# Patient Record
Sex: Female | Born: 1955
Health system: Southern US, Community
[De-identification: ages and names within clinical notes are randomized; demographics above are authoritative.]

## PROBLEM LIST (undated history)

## (undated) DIAGNOSIS — I639 Cerebral infarction, unspecified: Secondary | ICD-10-CM

## (undated) DIAGNOSIS — I10 Essential (primary) hypertension: Secondary | ICD-10-CM

## (undated) DIAGNOSIS — M1990 Unspecified osteoarthritis, unspecified site: Secondary | ICD-10-CM

## (undated) DIAGNOSIS — I739 Peripheral vascular disease, unspecified: Secondary | ICD-10-CM

## (undated) HISTORY — PX: TUBAL LIGATION: SHX77

## (undated) HISTORY — DX: Cerebral infarction, unspecified: I63.9

## (undated) HISTORY — DX: Peripheral vascular disease, unspecified: I73.9

---

## 1998-07-12 ENCOUNTER — Other Ambulatory Visit: Admission: RE | Admit: 1998-07-12 | Discharge: 1998-07-12 | Payer: Self-pay | Admitting: Obstetrics

## 2002-01-08 ENCOUNTER — Encounter: Payer: Self-pay | Admitting: *Deleted

## 2002-01-08 ENCOUNTER — Emergency Department (HOSPITAL_COMMUNITY): Admission: EM | Admit: 2002-01-08 | Discharge: 2002-01-08 | Payer: Self-pay | Admitting: *Deleted

## 2002-09-11 ENCOUNTER — Emergency Department (HOSPITAL_COMMUNITY): Admission: EM | Admit: 2002-09-11 | Discharge: 2002-09-12 | Payer: Self-pay | Admitting: Emergency Medicine

## 2002-09-11 ENCOUNTER — Encounter: Payer: Self-pay | Admitting: Emergency Medicine

## 2004-08-19 ENCOUNTER — Emergency Department (HOSPITAL_COMMUNITY): Admission: EM | Admit: 2004-08-19 | Discharge: 2004-08-19 | Payer: Self-pay | Admitting: Emergency Medicine

## 2004-09-27 ENCOUNTER — Ambulatory Visit: Payer: Self-pay | Admitting: Hematology and Oncology

## 2005-09-05 ENCOUNTER — Emergency Department (HOSPITAL_COMMUNITY): Admission: EM | Admit: 2005-09-05 | Discharge: 2005-09-05 | Payer: Self-pay | Admitting: Emergency Medicine

## 2015-10-08 DIAGNOSIS — I10 Essential (primary) hypertension: Secondary | ICD-10-CM | POA: Diagnosis not present

## 2015-10-08 DIAGNOSIS — Z0000 Encounter for general adult medical examination without abnormal findings: Secondary | ICD-10-CM | POA: Diagnosis not present

## 2015-10-11 DIAGNOSIS — I10 Essential (primary) hypertension: Secondary | ICD-10-CM | POA: Diagnosis not present

## 2015-10-11 DIAGNOSIS — R69 Illness, unspecified: Secondary | ICD-10-CM | POA: Diagnosis not present

## 2016-04-07 DIAGNOSIS — M1711 Unilateral primary osteoarthritis, right knee: Secondary | ICD-10-CM | POA: Diagnosis not present

## 2016-04-07 DIAGNOSIS — M1712 Unilateral primary osteoarthritis, left knee: Secondary | ICD-10-CM | POA: Diagnosis not present

## 2016-05-01 DIAGNOSIS — Z6832 Body mass index (BMI) 32.0-32.9, adult: Secondary | ICD-10-CM | POA: Diagnosis not present

## 2016-05-01 DIAGNOSIS — Z0000 Encounter for general adult medical examination without abnormal findings: Secondary | ICD-10-CM | POA: Diagnosis not present

## 2016-05-01 DIAGNOSIS — R69 Illness, unspecified: Secondary | ICD-10-CM | POA: Diagnosis not present

## 2016-05-01 DIAGNOSIS — I10 Essential (primary) hypertension: Secondary | ICD-10-CM | POA: Diagnosis not present

## 2016-05-01 DIAGNOSIS — E669 Obesity, unspecified: Secondary | ICD-10-CM | POA: Diagnosis not present

## 2016-05-01 DIAGNOSIS — M170 Bilateral primary osteoarthritis of knee: Secondary | ICD-10-CM | POA: Diagnosis not present

## 2016-05-09 DIAGNOSIS — M1711 Unilateral primary osteoarthritis, right knee: Secondary | ICD-10-CM | POA: Diagnosis not present

## 2016-05-09 DIAGNOSIS — M1712 Unilateral primary osteoarthritis, left knee: Secondary | ICD-10-CM | POA: Diagnosis not present

## 2016-05-23 DIAGNOSIS — M1711 Unilateral primary osteoarthritis, right knee: Secondary | ICD-10-CM | POA: Diagnosis not present

## 2016-05-23 DIAGNOSIS — M1712 Unilateral primary osteoarthritis, left knee: Secondary | ICD-10-CM | POA: Diagnosis not present

## 2016-05-30 DIAGNOSIS — M1711 Unilateral primary osteoarthritis, right knee: Secondary | ICD-10-CM | POA: Diagnosis not present

## 2016-05-30 DIAGNOSIS — M1712 Unilateral primary osteoarthritis, left knee: Secondary | ICD-10-CM | POA: Diagnosis not present

## 2016-06-06 DIAGNOSIS — M1711 Unilateral primary osteoarthritis, right knee: Secondary | ICD-10-CM | POA: Diagnosis not present

## 2016-06-06 DIAGNOSIS — M1712 Unilateral primary osteoarthritis, left knee: Secondary | ICD-10-CM | POA: Diagnosis not present

## 2016-07-04 DIAGNOSIS — M1711 Unilateral primary osteoarthritis, right knee: Secondary | ICD-10-CM | POA: Diagnosis not present

## 2016-07-04 DIAGNOSIS — M1712 Unilateral primary osteoarthritis, left knee: Secondary | ICD-10-CM | POA: Diagnosis not present

## 2016-07-06 ENCOUNTER — Ambulatory Visit (HOSPITAL_COMMUNITY)
Admission: EM | Admit: 2016-07-06 | Discharge: 2016-07-06 | Disposition: A | Discharge status: Home / Self Care | Payer: Medicare HMO | Attending: Internal Medicine | Admitting: Internal Medicine

## 2016-07-06 ENCOUNTER — Encounter (HOSPITAL_COMMUNITY): Payer: Self-pay | Admitting: Emergency Medicine

## 2016-07-06 DIAGNOSIS — K2970 Gastritis, unspecified, without bleeding: Secondary | ICD-10-CM

## 2016-07-06 DIAGNOSIS — K2990 Gastroduodenitis, unspecified, without bleeding: Secondary | ICD-10-CM

## 2016-07-06 DIAGNOSIS — R110 Nausea: Secondary | ICD-10-CM

## 2016-07-06 DIAGNOSIS — R55 Syncope and collapse: Secondary | ICD-10-CM

## 2016-07-06 HISTORY — DX: Essential (primary) hypertension: I10

## 2016-07-06 MED ORDER — OMEPRAZOLE 40 MG PO CPDR: 40.0000 mg | DELAYED_RELEASE_CAPSULE | Freq: Every day | ORAL | 0 refills | Status: DC

## 2016-07-06 MED ORDER — ONDANSETRON 4 MG PO TBDP: 4.0000 mg | ORAL_TABLET | Freq: Three times a day (TID) | ORAL | 0 refills | Status: DC | PRN

## 2016-07-06 NOTE — ED Provider Notes (Signed)
CSN: [id number]     Arrival date & time 07/06/16  1901 History   None    Chief Complaint  Patient presents with  . Loss of Consciousness   (Consider location/radiation/quality/duration/timing/severity/associated sxs/prior Treatment) Patient c/o passing out a few hours ago and was evaluated by EMS and she was stable so decided to follow up here at [facility].  She has been taking mobic and has gastritis and nausea.  She has not been drinking po well.  She reports being nauseated prior to passing out.     The history is provided by the patient.  Loss of Consciousness  Episode history:  Single Most recent episode:  Today Duration:  1 minute Timing:  Constant Progression:  Resolved Chronicity:  New Associated symptoms: nausea     Past Medical History:  Diagnosis Date  . Hypertension    History reviewed. No pertinent surgical history. History reviewed. No pertinent family history. Social History  Substance Use Topics  . Smoking status: Never Smoker  . Smokeless tobacco: Never Used  . Alcohol use No   OB History    No data available     Review of Systems  Constitutional: Negative.   HENT: Negative.   Eyes: Negative.   Respiratory: Negative.   Cardiovascular: Positive for syncope.  Gastrointestinal: Positive for nausea.  Endocrine: Negative.   Genitourinary: Negative.   Musculoskeletal: Negative.   Allergic/Immunologic: Negative.   Psychiatric/Behavioral: Negative.     Allergies  Patient has no known allergies.  Home Medications   Prior to Admission medications   Medication Sig Start Date End Date Taking? Authorizing Provider  lisinopril-hydrochlorothiazide (PRINZIDE,ZESTORETIC) 20-12.5 MG tablet Take 1 tablet by mouth daily.   Yes [provider]  meloxicam (MOBIC) 15 MG tablet Take 15 mg by mouth daily.   Yes [provider]  omeprazole (PRILOSEC) 40 MG capsule Take 1 capsule (40 mg total) by mouth daily. 07/06/16   Lysbeth Penner, FNP  ondansetron  (ZOFRAN ODT) 4 MG disintegrating tablet Take 1 tablet (4 mg total) by mouth every 8 (eight) hours as needed for nausea or vomiting. 07/06/16   Lysbeth Penner, FNP   Meds Ordered and Administered this Visit  Medications - No data to display  BP 117/69 (BP Location: Left Arm)   Pulse 64   Temp 97.9 F (36.6 C) (Oral)   Resp 18   SpO2 100%  No data found.   Physical Exam  Constitutional: She appears well-developed and well-nourished.  HENT:  Head: Normocephalic and atraumatic.  Eyes: Conjunctivae and EOM are normal. Pupils are equal, round, and reactive to light.  Neck: Normal range of motion. Neck supple.  Cardiovascular: Normal rate, regular rhythm and normal heart sounds.   Pulmonary/Chest: Effort normal and breath sounds normal.  Abdominal: Soft. Bowel sounds are normal.  Nursing note and vitals reviewed.   Urgent Care Course     Procedures (including critical care time)  Labs Review Labs Reviewed - No data to display  Imaging Review No results found.   Visual Acuity Review  Right Eye Distance:   Left Eye Distance:   Bilateral Distance:    Right Eye Near:   Left Eye Near:    Bilateral Near:         MDM   1. Gastritis and gastroduodenitis   2. Nausea without vomiting   3. Vasovagal syncope    Omeprazole 40mg  po qd x 14 days #14 Zofran ODT 4mg  one po tid prn #21  Push po fluids rest  and follow up with PCP.     Lysbeth Penner, [place] 07/06/16 2113

## 2016-07-06 NOTE — ED Triage Notes (Signed)
Pt reports a syncope episode about an hour ago   Reports she was sitting down in a store waiting for family members to come out when she passed out for 5 min  EMS on site  Sx at the moment include nauseas... sts she has been feeling sick the last couple of days associated w/vomiting  A&O x4... NAD... Speaking in complete sentences... Ambulatory

## 2016-10-05 DIAGNOSIS — M25561 Pain in right knee: Secondary | ICD-10-CM | POA: Diagnosis not present

## 2016-10-05 DIAGNOSIS — M25562 Pain in left knee: Secondary | ICD-10-CM | POA: Diagnosis not present

## 2017-03-22 DIAGNOSIS — R69 Illness, unspecified: Secondary | ICD-10-CM | POA: Diagnosis not present

## 2017-03-22 DIAGNOSIS — Z8249 Family history of ischemic heart disease and other diseases of the circulatory system: Secondary | ICD-10-CM | POA: Diagnosis not present

## 2017-03-22 DIAGNOSIS — Z803 Family history of malignant neoplasm of breast: Secondary | ICD-10-CM | POA: Diagnosis not present

## 2017-03-22 DIAGNOSIS — I10 Essential (primary) hypertension: Secondary | ICD-10-CM | POA: Diagnosis not present

## 2017-03-22 DIAGNOSIS — Z6831 Body mass index (BMI) 31.0-31.9, adult: Secondary | ICD-10-CM | POA: Diagnosis not present

## 2017-03-22 DIAGNOSIS — G8929 Other chronic pain: Secondary | ICD-10-CM | POA: Diagnosis not present

## 2017-03-22 DIAGNOSIS — E669 Obesity, unspecified: Secondary | ICD-10-CM | POA: Diagnosis not present

## 2017-03-29 ENCOUNTER — Encounter (HOSPITAL_COMMUNITY): Payer: Self-pay | Admitting: Emergency Medicine

## 2017-03-29 ENCOUNTER — Other Ambulatory Visit: Payer: Self-pay

## 2017-03-29 ENCOUNTER — Ambulatory Visit (HOSPITAL_COMMUNITY)
Admission: EM | Admit: 2017-03-29 | Discharge: 2017-03-29 | Disposition: A | Discharge status: Home / Self Care | Payer: Medicare HMO

## 2017-03-29 DIAGNOSIS — M79672 Pain in left foot: Secondary | ICD-10-CM | POA: Diagnosis not present

## 2017-03-29 HISTORY — DX: Unspecified osteoarthritis, unspecified site: M19.90

## 2017-03-29 MED ORDER — DICLOFENAC SODIUM 1 % TD GEL: 2.0000 g | Freq: Four times a day (QID) | TRANSDERMAL | 0 refills | Status: DC

## 2017-03-29 NOTE — ED Triage Notes (Signed)
States has left foot pain due to compensating from osteoarthritis bilateral knee pain. No known injury onset one day.

## 2017-03-29 NOTE — ED Provider Notes (Signed)
[place]    CSN: [id number] Arrival date & time: 03/29/17  1646     History   Chief Complaint Chief Complaint  Patient presents with  . Foot Pain    HPI Ashley Price is a 62 y.o. female.   62 year old female with history of HTN, osteoarthritis comes in for 1 day history of left foot pain.  States pain is due to compensation of osteoarthritis of bilateral knee pain.  No injury.  States lateral side of left foot started hurting with weightbearing.  She states she has tramadol from her orthopedic due to her arthritis, but has not taken any.  States she was on Mobic in the past, that caused gastritis.  She was given relafen by her orthopedics, which did not help with the pain. Denies swelling, numbness, tingling.  Denies erythema, increased warmth.      Past Medical History:  Diagnosis Date  . Hypertension   . Osteoarthritis     There are no active problems to display for this patient.   Past Surgical History:  Procedure Laterality Date  . TUBAL LIGATION Bilateral     OB History    No data available       Home Medications    Prior to Admission medications   Medication Sig Start Date End Date Taking? Authorizing Provider  nabumetone (RELAFEN) 500 MG tablet Take 500 mg by mouth 2 (two) times daily.   Yes [provider]  diclofenac sodium (VOLTAREN) 1 % GEL Apply 2 g topically 4 (four) times daily. 03/29/17   Ok Edwards, PA-C  lisinopril-hydrochlorothiazide (PRINZIDE,ZESTORETIC) 20-12.5 MG tablet Take 1 tablet by mouth daily.    [provider]    Family History No family history on file.  Social History Social History   Tobacco Use  . Smoking status: Current Every Day Smoker    Types: Cigarettes  . Smokeless tobacco: Never Used  Substance Use Topics  . Alcohol use: No  . Drug use: No     Allergies   Patient has no known allergies.   Review of Systems Review of Systems  Reason unable to perform ROS: See HPI as  above.     Physical Exam Triage Vital Signs ED Triage Vitals  Enc Vitals Group     BP 03/29/17 1742 (!) 182/72     Pulse Rate 03/29/17 1742 60     Resp --      Temp 03/29/17 1742 98.5 F (36.9 C)     Temp Source 03/29/17 1742 Oral     SpO2 03/29/17 1742 97 %     Weight --      Height --      Head Circumference --      Peak Flow --      Pain Score 03/29/17 1744 9     Pain Loc --      Pain Edu? --      Excl. in [place]? --    No data found.  Updated Vital Signs BP (!) 182/72 (BP Location: Left Arm)   Pulse 60   Temp 98.5 F (36.9 C) (Oral)   SpO2 97%   Physical Exam  Constitutional: She is oriented to person, place, and time. She appears well-developed and well-nourished. No distress.  HENT:  Head: Normocephalic and atraumatic.  Eyes: Conjunctivae are normal. Pupils are equal, round, and reactive to light.  Musculoskeletal:  No swelling, erythema, increased warmth, contusions.  Tenderness to palpation of  fourth and  fifth MTP.  No tenderness on palpation of the ankle.  Full range of motion.  Strength normal and equal bilaterally.  Sensation intact and equal bilaterally.  Pedal pulses 2+ and equal bilaterally.  Cap refill less than 2 seconds.  Neurological: She is alert and oriented to person, place, and time.     UC Treatments / Results  Labs (all labs ordered are listed, but only abnormal results are displayed) Labs Reviewed - No data to display  EKG  EKG Interpretation None       Radiology No results found.  Procedures Procedures (including critical care time)  Medications Ordered in UC Medications - No data to display   Initial Impression / Assessment and Plan / UC Course  I have reviewed the triage vital signs and the nursing notes.  Pertinent labs & imaging results that were available during my care of the patient were reviewed by me and considered in my medical decision making (see chart for details).    Given patient  with atraumatic foot pain,  x-ray deferred.  Will have patient take Tylenol for the pain.  Voltaren gel for additional pain relief.  Patient with tramadol, to take during breakthrough pain.  Ice compress, elevation, Ace wrap during activity.  Patient to follow-up with orthopedics for further evaluation and management needed.  Patient expresses understanding and agrees to plan.  Final Clinical Impressions(s) / UC Diagnoses   Final diagnoses:  Foot pain, left    ED Discharge Orders        Ordered    diclofenac sodium (VOLTAREN) 1 % GEL  4 times daily     03/29/17 1844        Ok Edwards, PA-C 03/29/17 1850

## 2017-03-29 NOTE — Discharge Instructions (Signed)
Start Tylenol for pain.  Voltaren gel for additional pain relief.  You can take tramadol for breakthrough pain.  Ice compress, elevation, Ace wrap during activity.  Follow-up with your orthopedics for further evaluation and management needed.

## 2017-05-03 ENCOUNTER — Encounter (HOSPITAL_COMMUNITY): Payer: Self-pay | Admitting: Family Medicine

## 2017-05-03 ENCOUNTER — Ambulatory Visit (HOSPITAL_COMMUNITY)
Admission: EM | Admit: 2017-05-03 | Discharge: 2017-05-03 | Disposition: A | Discharge status: Home / Self Care | Payer: Medicare HMO | Attending: Family Medicine | Admitting: Family Medicine

## 2017-05-03 DIAGNOSIS — K0889 Other specified disorders of teeth and supporting structures: Secondary | ICD-10-CM | POA: Diagnosis not present

## 2017-05-03 MED ORDER — AMOXICILLIN 875 MG PO TABS: 875.0000 mg | ORAL_TABLET | Freq: Two times a day (BID) | ORAL | 0 refills | Status: AC

## 2017-05-03 MED ORDER — IBUPROFEN 800 MG PO TABS: 800.0000 mg | ORAL_TABLET | Freq: Three times a day (TID) | ORAL | 0 refills | Status: DC

## 2017-05-03 NOTE — ED Provider Notes (Signed)
[place]    CSN: [id number] Arrival date & time: 05/03/17  1141     History   Chief Complaint Chief Complaint  Patient presents with  . Dental Pain    HPI Ashley Price is a 61 y.o. female.   [name] presents with complaints of lower jaw dental pain which started 3/30. Has caused swelling. The teeth, bilateral lower incisors, where pain is have broken off. She typically wears a lower partial. Has not been able to wear it or eat due to pain. Pain 4/10, worse to right incisor. No known fevers. Denies any known drainage/pus/foul taste. Has needed antibiotics for similar in the past. Called to get teeth pulled but couldn't get in for two weeks. Hx of htn, OA.    ROS per HPI.      Past Medical History:  Diagnosis Date  . Hypertension   . Osteoarthritis     There are no active problems to display for this patient.   Past Surgical History:  Procedure Laterality Date  . TUBAL LIGATION Bilateral     OB History   None      Home Medications    Prior to Admission medications   Medication Sig Start Date End Date Taking? Authorizing Provider  amoxicillin (AMOXIL) 875 MG tablet Take 1 tablet (875 mg total) by mouth 2 (two) times daily for 10 days. 05/03/17 05/13/17  Augusto Gamble B, NP  diclofenac sodium (VOLTAREN) 1 % GEL Apply 2 g topically 4 (four) times daily. 03/29/17   Tasia Catchings, Amy V, PA-C  ibuprofen (ADVIL,MOTRIN) 800 MG tablet Take 1 tablet (800 mg total) by mouth 3 (three) times daily. 05/03/17   Zigmund Gottron, NP  lisinopril-hydrochlorothiazide (PRINZIDE,ZESTORETIC) 20-12.5 MG tablet Take 1 tablet by mouth daily.    [provider]  nabumetone (RELAFEN) 500 MG tablet Take 500 mg by mouth 2 (two) times daily.    [provider]    Family History History reviewed. No pertinent family history.  Social History Social History   Tobacco Use  . Smoking status: Current Every Day Smoker    Types: Cigarettes  . Smokeless tobacco: Never  Used  Substance Use Topics  . Alcohol use: No  . Drug use: No     Allergies   Patient has no known allergies.   Review of Systems Review of Systems   Physical Exam Triage Vital Signs ED Triage Vitals  Enc Vitals Group     BP 05/03/17 1151 (!) 160/93     Pulse Rate 05/03/17 1151 81     Resp 05/03/17 1151 18     Temp 05/03/17 1151 98.1 F (36.7 C)     Temp src --      SpO2 05/03/17 1151 98 %     Weight --      Height --      Head Circumference --      Peak Flow --      Pain Score 05/03/17 1150 5     Pain Loc --      Pain Edu? --      Excl. in [place]? --    No data found.  Updated Vital Signs BP (!) 160/93   Pulse 81   Temp 98.1 F (36.7 C)   Resp 18   SpO2 98%   Visual Acuity Right Eye Distance:   Left Eye Distance:   Bilateral Distance:    Right Eye Near:   Left Eye Near:    Bilateral Near:  Physical Exam  Constitutional: She is oriented to person, place, and time. She appears well-developed and well-nourished. No distress.  HENT:  Mouth/Throat:    Only two partial remaining teeth to lower jaw remaining. Bilateral lower incisors- broken off, they are flush with gumline and brown in color; right incisor with significant swelling surrounding and tenderness on palpation  Cardiovascular: Normal rate, regular rhythm and normal heart sounds.  Pulmonary/Chest: Effort normal and breath sounds normal.  Neurological: She is alert and oriented to person, place, and time.  Skin: Skin is warm and dry.     UC Treatments / Results  Labs (all labs ordered are listed, but only abnormal results are displayed) Labs Reviewed - No data to display  EKG None Radiology No results found.  Procedures Procedures (including critical care time)  Medications Ordered in UC Medications - No data to display   Initial Impression / Assessment and Plan / UC Course  I have reviewed the triage vital signs and the nursing notes.  Pertinent labs & imaging results that  were available during my care of the patient were reviewed by me and considered in my medical decision making (see chart for details).     Antibiotics initiated. Ibuprofen, ice for pain control. Continue to follow with dentist for definitive therapy. Patient verbalized understanding and agreeable to plan.    Final Clinical Impressions(s) / UC Diagnoses   Final diagnoses:  Pain, dental    ED Discharge Orders        Ordered    amoxicillin (AMOXIL) 875 MG tablet  2 times daily     05/03/17 1159    ibuprofen (ADVIL,MOTRIN) 800 MG tablet  3 times daily     05/03/17 1200       Controlled Substance Prescriptions [place] Controlled Substance Registry consulted? Not Applicable   Zigmund Gottron, NP 05/03/17 1221

## 2017-05-03 NOTE — Discharge Instructions (Addendum)
Ice to jaw. Ibuprofen as needed for pain, take with food. Antibiotics as prescribed. Please follow up with your dentist for definitive treatment.

## 2017-05-03 NOTE — ED Triage Notes (Signed)
Pt here for dental pain and swelling

## 2018-02-05 DIAGNOSIS — Z803 Family history of malignant neoplasm of breast: Secondary | ICD-10-CM | POA: Diagnosis not present

## 2018-02-05 DIAGNOSIS — E669 Obesity, unspecified: Secondary | ICD-10-CM | POA: Diagnosis not present

## 2018-02-05 DIAGNOSIS — K08409 Partial loss of teeth, unspecified cause, unspecified class: Secondary | ICD-10-CM | POA: Diagnosis not present

## 2018-02-05 DIAGNOSIS — G8929 Other chronic pain: Secondary | ICD-10-CM | POA: Diagnosis not present

## 2018-02-05 DIAGNOSIS — I10 Essential (primary) hypertension: Secondary | ICD-10-CM | POA: Diagnosis not present

## 2018-02-05 DIAGNOSIS — R69 Illness, unspecified: Secondary | ICD-10-CM | POA: Diagnosis not present

## 2018-08-17 ENCOUNTER — Observation Stay (HOSPITAL_COMMUNITY): Payer: Medicare HMO

## 2018-08-17 ENCOUNTER — Other Ambulatory Visit: Payer: Self-pay

## 2018-08-17 ENCOUNTER — Emergency Department (HOSPITAL_COMMUNITY): Payer: Medicare HMO

## 2018-08-17 ENCOUNTER — Encounter (HOSPITAL_COMMUNITY): Payer: Self-pay | Admitting: Emergency Medicine

## 2018-08-17 ENCOUNTER — Observation Stay (HOSPITAL_COMMUNITY)
Admission: EM | Admit: 2018-08-17 | Discharge: 2018-08-18 | Disposition: A | Discharge status: Home / Self Care | Payer: Medicare HMO | Attending: Internal Medicine | Admitting: Internal Medicine

## 2018-08-17 DIAGNOSIS — I160 Hypertensive urgency: Secondary | ICD-10-CM | POA: Diagnosis not present

## 2018-08-17 DIAGNOSIS — Z03818 Encounter for observation for suspected exposure to other biological agents ruled out: Secondary | ICD-10-CM | POA: Diagnosis not present

## 2018-08-17 DIAGNOSIS — Z791 Long term (current) use of non-steroidal anti-inflammatories (NSAID): Secondary | ICD-10-CM | POA: Insufficient documentation

## 2018-08-17 DIAGNOSIS — M009 Pyogenic arthritis, unspecified: Secondary | ICD-10-CM | POA: Diagnosis not present

## 2018-08-17 DIAGNOSIS — L0390 Cellulitis, unspecified: Secondary | ICD-10-CM | POA: Diagnosis not present

## 2018-08-17 DIAGNOSIS — M86172 Other acute osteomyelitis, left ankle and foot: Secondary | ICD-10-CM | POA: Insufficient documentation

## 2018-08-17 DIAGNOSIS — Z1159 Encounter for screening for other viral diseases: Secondary | ICD-10-CM | POA: Diagnosis not present

## 2018-08-17 DIAGNOSIS — M868X7 Other osteomyelitis, ankle and foot: Secondary | ICD-10-CM | POA: Diagnosis not present

## 2018-08-17 DIAGNOSIS — M79675 Pain in left toe(s): Secondary | ICD-10-CM | POA: Diagnosis not present

## 2018-08-17 DIAGNOSIS — L03116 Cellulitis of left lower limb: Secondary | ICD-10-CM | POA: Diagnosis not present

## 2018-08-17 DIAGNOSIS — M1990 Unspecified osteoarthritis, unspecified site: Secondary | ICD-10-CM | POA: Diagnosis not present

## 2018-08-17 DIAGNOSIS — D72829 Elevated white blood cell count, unspecified: Secondary | ICD-10-CM | POA: Diagnosis not present

## 2018-08-17 DIAGNOSIS — L089 Local infection of the skin and subcutaneous tissue, unspecified: Secondary | ICD-10-CM | POA: Diagnosis not present

## 2018-08-17 DIAGNOSIS — M7989 Other specified soft tissue disorders: Secondary | ICD-10-CM | POA: Diagnosis not present

## 2018-08-17 DIAGNOSIS — M869 Osteomyelitis, unspecified: Secondary | ICD-10-CM | POA: Diagnosis present

## 2018-08-17 DIAGNOSIS — F17210 Nicotine dependence, cigarettes, uncomplicated: Secondary | ICD-10-CM | POA: Insufficient documentation

## 2018-08-17 DIAGNOSIS — R9431 Abnormal electrocardiogram [ECG] [EKG]: Secondary | ICD-10-CM | POA: Diagnosis not present

## 2018-08-17 DIAGNOSIS — R69 Illness, unspecified: Secondary | ICD-10-CM | POA: Diagnosis not present

## 2018-08-17 DIAGNOSIS — I10 Essential (primary) hypertension: Secondary | ICD-10-CM | POA: Diagnosis not present

## 2018-08-17 DIAGNOSIS — M00872 Arthritis due to other bacteria, left ankle and foot: Secondary | ICD-10-CM | POA: Diagnosis not present

## 2018-08-17 LAB — URINALYSIS, ROUTINE W REFLEX MICROSCOPIC
Bilirubin Urine: NEGATIVE
Glucose, UA: NEGATIVE mg/dL
Hgb urine dipstick: NEGATIVE
Ketones, ur: NEGATIVE mg/dL
Nitrite: NEGATIVE
Protein, ur: NEGATIVE mg/dL
Specific Gravity, Urine: 1.004 — ABNORMAL LOW (ref 1.005–1.030)
pH: 6 (ref 5.0–8.0)

## 2018-08-17 LAB — BASIC METABOLIC PANEL
Anion gap: 11 (ref 5–15)
BUN: 9 mg/dL (ref 8–23)
CO2: 20 mmol/L — ABNORMAL LOW (ref 22–32)
Calcium: 9.6 mg/dL (ref 8.9–10.3)
Chloride: 108 mmol/L (ref 98–111)
Creatinine, Ser: 0.91 mg/dL (ref 0.44–1.00)
GFR calc Af Amer: >60 mL/min (ref >60)
GFR calc non Af Amer: >60 mL/min (ref >60)
Glucose, Bld: 106 mg/dL — ABNORMAL HIGH (ref 70–99)
Potassium: 4.4 mmol/L (ref 3.5–5.1)
Sodium: 139 mmol/L (ref 135–145)

## 2018-08-17 LAB — CBC WITH DIFFERENTIAL/PLATELET
Abs Immature Granulocytes: 0.06 10*3/uL (ref 0.00–0.07)
Basophils Absolute: 0.1 10*3/uL (ref 0.0–0.1)
Basophils Relative: 1 %
Eosinophils Absolute: 0.1 10*3/uL (ref 0.0–0.5)
Eosinophils Relative: 1 %
HCT: 47.3 % — ABNORMAL HIGH (ref 36.0–46.0)
Hemoglobin: 15.5 g/dL — ABNORMAL HIGH (ref 12.0–15.0)
Immature Granulocytes: 0 %
Lymphocytes Relative: 27 %
Lymphs Abs: 4.3 10*3/uL — ABNORMAL HIGH (ref 0.7–4.0)
MCH: 29.2 pg (ref 26.0–34.0)
MCHC: 32.8 g/dL (ref 30.0–36.0)
MCV: 89.2 fL (ref 80.0–100.0)
Monocytes Absolute: 0.9 10*3/uL (ref 0.1–1.0)
Monocytes Relative: 6 %
Neutro Abs: 10.3 10*3/uL — ABNORMAL HIGH (ref 1.7–7.7)
Neutrophils Relative %: 65 %
Platelets: 273 10*3/uL (ref 150–400)
RBC: 5.3 MIL/uL — ABNORMAL HIGH (ref 3.87–5.11)
RDW: 14.6 % (ref 11.5–15.5)
WBC: 15.7 10*3/uL — ABNORMAL HIGH (ref 4.0–10.5)
nRBC: 0 % (ref 0.0–0.2)

## 2018-08-17 LAB — PROTIME-INR
INR: 1.1 (ref 0.8–1.2)
Prothrombin Time: 13.8 seconds (ref 11.4–15.2)

## 2018-08-17 LAB — LACTIC ACID, PLASMA: Lactic Acid, Venous: 1.8 mmol/L (ref 0.5–1.9)

## 2018-08-17 LAB — C-REACTIVE PROTEIN: CRP: 1.7 mg/dL — ABNORMAL HIGH (ref <1.0)

## 2018-08-17 LAB — HEPATIC FUNCTION PANEL
ALT: 10 U/L (ref 0–44)
AST: 14 U/L — ABNORMAL LOW (ref 15–41)
Albumin: 3.8 g/dL (ref 3.5–5.0)
Alkaline Phosphatase: 73 U/L (ref 38–126)
Bilirubin, Direct: 0.1 mg/dL (ref 0.0–0.2)
Indirect Bilirubin: 0.3 mg/dL (ref 0.3–0.9)
Total Bilirubin: 0.4 mg/dL (ref 0.3–1.2)
Total Protein: 7.6 g/dL (ref 6.5–8.1)

## 2018-08-17 LAB — SARS CORONAVIRUS 2 BY RT PCR (HOSPITAL ORDER, PERFORMED IN ~~LOC~~ HOSPITAL LAB): SARS Coronavirus 2: NEGATIVE

## 2018-08-17 LAB — SEDIMENTATION RATE: Sed Rate: 16 mm/hr (ref 0–22)

## 2018-08-17 LAB — APTT: aPTT: 29 seconds (ref 24–36)

## 2018-08-17 MED ORDER — SODIUM CHLORIDE 0.9% FLUSH
3.0000 mL | Freq: Two times a day (BID) | INTRAVENOUS | Status: DC
  Administered 2018-08-17 (×2): 3 mL via INTRAVENOUS

## 2018-08-17 MED ORDER — VANCOMYCIN HCL IN DEXTROSE 1-5 GM/200ML-% IV SOLN
1000.0000 mg | INTRAVENOUS | Status: DC
  Filled 2018-08-17: qty 200

## 2018-08-17 MED ORDER — SODIUM CHLORIDE 0.9 % IV SOLN
2.0000 g | Freq: Three times a day (TID) | INTRAVENOUS | Status: DC
  Administered 2018-08-18: 2 g via INTRAVENOUS
  Filled 2018-08-17: qty 2

## 2018-08-17 MED ORDER — DICLOFENAC SODIUM 1 % TD GEL
2.0000 g | Freq: Four times a day (QID) | TRANSDERMAL | Status: DC
  Administered 2018-08-17: 2 g via TOPICAL
  Filled 2018-08-17: qty 100

## 2018-08-17 MED ORDER — HYDROCHLOROTHIAZIDE 12.5 MG PO CAPS
12.5000 mg | ORAL_CAPSULE | Freq: Every day | ORAL | Status: DC
  Administered 2018-08-17 – 2018-08-18 (×2): 12.5 mg via ORAL
  Filled 2018-08-17 (×2): qty 1

## 2018-08-17 MED ORDER — GADOBUTROL 1 MMOL/ML IV SOLN
8.0000 mL | Freq: Once | INTRAVENOUS | Status: AC | PRN
  Administered 2018-08-17: 8 mL via INTRAVENOUS

## 2018-08-17 MED ORDER — SODIUM CHLORIDE 0.9 % IV SOLN
2.0000 g | Freq: Once | INTRAVENOUS | Status: AC
  Administered 2018-08-17: 2 g via INTRAVENOUS
  Filled 2018-08-17: qty 2

## 2018-08-17 MED ORDER — CHLORHEXIDINE GLUCONATE 4 % EX LIQD
60.0000 mL | Freq: Once | CUTANEOUS | Status: AC
  Administered 2018-08-18: 4 via TOPICAL
  Filled 2018-08-17: qty 60

## 2018-08-17 MED ORDER — LISINOPRIL 20 MG PO TABS
20.0000 mg | ORAL_TABLET | Freq: Every day | ORAL | Status: DC
  Administered 2018-08-17 – 2018-08-18 (×2): 20 mg via ORAL
  Filled 2018-08-17 (×2): qty 1

## 2018-08-17 MED ORDER — HYDROCODONE-ACETAMINOPHEN 5-325 MG PO TABS: 1.0000 | ORAL_TABLET | Freq: Four times a day (QID) | ORAL | Status: DC | PRN

## 2018-08-17 MED ORDER — VANCOMYCIN HCL 10 G IV SOLR
1500.0000 mg | Freq: Once | INTRAVENOUS | Status: AC
  Administered 2018-08-17: 1500 mg via INTRAVENOUS
  Filled 2018-08-17: qty 1500

## 2018-08-17 MED ORDER — ONDANSETRON HCL 4 MG PO TABS: 4.0000 mg | ORAL_TABLET | Freq: Four times a day (QID) | ORAL | Status: DC | PRN

## 2018-08-17 MED ORDER — ONDANSETRON HCL 4 MG/2ML IJ SOLN: 4.0000 mg | Freq: Four times a day (QID) | INTRAMUSCULAR | Status: DC | PRN

## 2018-08-17 MED ORDER — ENOXAPARIN SODIUM 40 MG/0.4ML ~~LOC~~ SOLN
40.0000 mg | SUBCUTANEOUS | Status: DC
  Administered 2018-08-17: 40 mg via SUBCUTANEOUS
  Filled 2018-08-17: qty 0.4

## 2018-08-17 MED ORDER — ENSURE PRE-SURGERY PO LIQD
296.0000 mL | Freq: Once | ORAL | Status: AC
  Administered 2018-08-17: 296 mL via ORAL
  Filled 2018-08-17: qty 296

## 2018-08-17 MED ORDER — HYDRALAZINE HCL 20 MG/ML IJ SOLN
10.0000 mg | INTRAMUSCULAR | Status: DC | PRN
  Administered 2018-08-17: 10 mg via INTRAVENOUS
  Filled 2018-08-17: qty 1

## 2018-08-17 MED ORDER — MUPIROCIN 2 % EX OINT: 1.0000 "application " | TOPICAL_OINTMENT | Freq: Two times a day (BID) | CUTANEOUS | Status: DC

## 2018-08-17 MED ORDER — ACETAMINOPHEN 325 MG PO TABS: 650.0000 mg | ORAL_TABLET | Freq: Four times a day (QID) | ORAL | Status: DC | PRN

## 2018-08-17 MED ORDER — NABUMETONE 500 MG PO TABS
500.0000 mg | ORAL_TABLET | Freq: Two times a day (BID) | ORAL | Status: DC
  Administered 2018-08-17: 500 mg via ORAL
  Filled 2018-08-17 (×3): qty 1

## 2018-08-17 MED ORDER — LISINOPRIL-HYDROCHLOROTHIAZIDE 20-12.5 MG PO TABS: 1.0000 | ORAL_TABLET | Freq: Every day | ORAL | Status: DC

## 2018-08-17 MED ORDER — ACETAMINOPHEN 650 MG RE SUPP: 650.0000 mg | Freq: Four times a day (QID) | RECTAL | Status: DC | PRN

## 2018-08-17 NOTE — Progress Notes (Signed)
Ashley Price [id number] Admission Data: 08/17/2018 3:40 PM Attending Provider: Norval Morton, MD  PCP:Sun, Gari Crown, MD Consults/ Treatment Team:   Ashley Price is a 63 y.o. female patient admitted from ED awake, alert  & orientated  X 3,  Full Code, VSS - Blood pressure (!) 139/118, pulse 67, temperature 98 F (36.7 C), temperature source Oral, resp. rate 14, height 5' 3.5" (1.613 m), weight 79.8 kg, SpO2 100 %.,no c/o shortness of breath, no c/o chest pain, no distress noted. Tele # 19 placed and pt is currently running:normal sinus rhythm.   IV site WDL:  hand right, condition patent and no redness with a transparent dsg that's clean dry and intact.  Allergies:  No Known Allergies   Past Medical History:  Diagnosis Date  . Hypertension   . Osteoarthritis     History:  obtained from chart review. Tobacco/alcohol: denied none  Pt orientation to unit, room and routine. Information packet given to patient/family and safety video watched.  Admission INP armband ID verified with patient/family, and in place. SR up x 2, fall risk assessment complete with Patient and family verbalizing understanding of risks associated with falls. Pt verbalizes an understanding of how to use the call bell and to call for help before getting out of bed.  Skin, clean-dry- intact without evidence of bruising, or skin tears.   No evidence of skin break down noted on exam. no rashes, no ecchymoses, no petechiae, no nodules, no jaundice, no purpura, no wounds  Will cont to monitor and assist as needed.  Cathlean Marseilles Tamala Julian, MSN, RN, [place], [place] 08/17/2018 3:40 PM

## 2018-08-17 NOTE — ED Triage Notes (Signed)
Pt reports swelling to 2nd toe of Left foot x5 days. Reports that she stepped on a nail many years ago that caused a callus and she thinks pain is related. States she has seen a podiatrist that told her to keep the callus shaved down.

## 2018-08-17 NOTE — Consult Note (Signed)
Reason for Consult:Evaluate left second toe infection Referring Physician: Dr. Alen Blew Ashley Price is an 63 y.o. female.  HPI: Ashley Price is a very pleasant 63 year old female who has about a 2 week history of swelling of the left second toe.  She notes that the toe has always been prominent sitting above the level of the surrounding toes which come together beneath it.  It tends to press against shoes and recently the nail plate was pulled off.  Over the last couple weeks she's noted increased swelling in the toe but surprisingly not had much pain.  She denies history of diabetes or other neuropathies.  She denies other affected small joints, although notes she has bad knees on both sides.  Past Medical History:  Diagnosis Date  . Hypertension   . Osteoarthritis     Past Surgical History:  Procedure Laterality Date  . TUBAL LIGATION Bilateral     Family History  Problem Relation Age of Onset  . Breast cancer Mother   . Kidney failure Brother     Social History:  reports that she has been smoking cigarettes. She has never used smokeless tobacco. She reports that she does not drink alcohol or use drugs.  Allergies: No Known Allergies  Medications: I have reviewed the patient's current medications.  Results for orders placed or performed during the hospital encounter of 08/17/18 (from the past 48 hour(s))  Basic metabolic panel     Status: Abnormal   Collection Time: 08/17/18 11:47 AM  Result Value Ref Range   Sodium 139 135 - 145 mmol/L   Potassium 4.4 3.5 - 5.1 mmol/L   Chloride 108 98 - 111 mmol/L   CO2 20 (L) 22 - 32 mmol/L   Glucose, Bld 106 (H) 70 - 99 mg/dL   BUN 9 8 - 23 mg/dL   Creatinine, Ser 0.91 0.44 - 1.00 mg/dL   Calcium 9.6 8.9 - 10.3 mg/dL   GFR calc non Af Amer >60 >60 mL/min   GFR calc Af Amer >60 >60 mL/min   Anion gap 11 5 - 15    Comment: Performed at [facility], [place] [address] [place], [place] [address]  CBC with Differential     Status:  Abnormal   Collection Time: 08/17/18 11:47 AM  Result Value Ref Range   WBC 15.7 (H) 4.0 - 10.5 K/uL   RBC 5.30 (H) 3.87 - 5.11 MIL/uL   Hemoglobin 15.5 (H) 12.0 - 15.0 g/dL   HCT 47.3 (H) 36.0 - 46.0 %   MCV 89.2 80.0 - 100.0 fL   MCH 29.2 26.0 - 34.0 pg   MCHC 32.8 30.0 - 36.0 g/dL   RDW 14.6 11.5 - 15.5 %   Platelets 273 150 - 400 K/uL   nRBC 0.0 0.0 - 0.2 %   Neutrophils Relative % 65 %   Neutro Abs 10.3 (H) 1.7 - 7.7 K/uL   Lymphocytes Relative 27 %   Lymphs Abs 4.3 (H) 0.7 - 4.0 K/uL   Monocytes Relative 6 %   Monocytes Absolute 0.9 0.1 - 1.0 K/uL   Eosinophils Relative 1 %   Eosinophils Absolute 0.1 0.0 - 0.5 K/uL   Basophils Relative 1 %   Basophils Absolute 0.1 0.0 - 0.1 K/uL   WBC Morphology VACUOLATED NEUTROPHILS    Immature Granulocytes 0 %   Abs Immature Granulocytes 0.06 0.00 - 0.07 K/uL    Comment: Performed at [facility], [place] [address], [place], [place] [address]  Sedimentation rate     Status: None   Collection Time: 08/17/18 11:47 AM  Result Value Ref Range   Sed Rate 16 0 - 22 mm/hr    Comment: Performed at [facility] [address], [place], [place] [address]  Urinalysis, Routine w reflex microscopic     Status: Abnormal   Collection Time: 08/17/18 12:34 PM  Result Value Ref Range   Color, Urine STRAW (A) YELLOW   APPearance CLEAR CLEAR   Specific Gravity, Urine 1.004 (L) 1.005 - 1.030   pH 6.0 5.0 - 8.0   Glucose, UA NEGATIVE NEGATIVE mg/dL   Hgb urine dipstick NEGATIVE NEGATIVE   Bilirubin Urine NEGATIVE NEGATIVE   Ketones, ur NEGATIVE NEGATIVE mg/dL   Protein, ur NEGATIVE NEGATIVE mg/dL   Nitrite NEGATIVE NEGATIVE   Leukocytes,Ua TRACE (A) NEGATIVE   RBC / HPF 0-5 0 - 5 RBC/hpf   WBC, UA 0-5 0 - 5 WBC/hpf   Bacteria, UA RARE (A) NONE SEEN   Squamous Epithelial / LPF 0-5 0 - 5    Comment: Performed at [facility], [address], [place], [place] [address]  SARS Coronavirus 2 (CEPHEID - Performed in [place] hospital  lab), Hosp Order     Status: None   Collection Time: 08/17/18 12:41 PM   Specimen: Nasopharyngeal Swab  Result Value Ref Range   SARS Coronavirus 2 NEGATIVE NEGATIVE    Comment: (NOTE) If result is NEGATIVE SARS-CoV-2 target nucleic acids are NOT DETECTED. The SARS-CoV-2 RNA is generally detectable in upper and lower  respiratory specimens during the acute phase of infection. The lowest  concentration of SARS-CoV-2 viral copies this assay can detect is 250  copies / mL. A negative result does not preclude SARS-CoV-2 infection  and should not be used as the sole basis for treatment or other  patient management decisions.  A negative result may occur with  improper specimen collection / handling, submission of specimen other  than nasopharyngeal swab, presence of viral mutation(s) within the  areas targeted by this assay, and inadequate number of viral copies  (<250 copies / mL). A negative result must be combined with clinical  observations, patient history, and epidemiological information. If result is POSITIVE SARS-CoV-2 target nucleic acids are DETECTED. The SARS-CoV-2 RNA is generally detectable in upper and lower  respiratory specimens dur ing the acute phase of infection.  Positive  results are indicative of active infection with SARS-CoV-2.  Clinical  correlation with patient history and other diagnostic information is  necessary to determine patient infection status.  Positive results do  not rule out bacterial infection or co-infection with other viruses. If result is PRESUMPTIVE POSTIVE SARS-CoV-2 nucleic acids MAY BE PRESENT.   A presumptive positive result was obtained on the submitted specimen  and confirmed on repeat testing.  While 2019 novel coronavirus  (SARS-CoV-2) nucleic acids may be present in the submitted sample  additional confirmatory testing may be necessary for epidemiological  and / or clinical management purposes  to differentiate between  SARS-CoV-2 and  other Sarbecovirus currently known to infect humans.  If clinically indicated additional testing with an alternate test  methodology [phone]) is advised. The SARS-CoV-2 RNA is generally  detectable in upper and lower respiratory sp ecimens during the acute  phase of infection. The expected result is Negative. Fact Sheet for Patients:  StrictlyIdeas.no Fact Sheet for Healthcare Providers: BankingDealers.co.za This test is not yet approved or cleared by the [place] FDA and has  been authorized for detection and/or diagnosis of SARS-CoV-2 by FDA under an Emergency Use Authorization (EUA).  This EUA will remain in effect (meaning this test can be used) for the duration of the COVID-19 declaration under Section 564(b)(1) of the Act, 21 U.S.C. section 360bbb-3(b)(1), unless the authorization is terminated or revoked sooner. Performed at [facility], [place] [address] [place], [place] [address]   Hepatic function panel     Status: Abnormal   Collection Time: 08/17/18  1:12 PM  Result Value Ref Range   Total Protein 7.6 6.5 - 8.1 g/dL   Albumin 3.8 3.5 - 5.0 g/dL   AST 14 (L) 15 - 41 U/L   ALT 10 0 - 44 U/L   Alkaline Phosphatase 73 38 - 126 U/L   Total Bilirubin 0.4 0.3 - 1.2 mg/dL   Bilirubin, Direct 0.1 0.0 - 0.2 mg/dL   Indirect Bilirubin 0.3 0.3 - 0.9 mg/dL    Comment: Performed at [facility] [address] [place], [place] [address]  Lactic acid, plasma     Status: None   Collection Time: 08/17/18  1:12 PM  Result Value Ref Range   Lactic Acid, Venous 1.8 0.5 - 1.9 mmol/L    Comment: Performed at [facility] [address], [place], [place] [address]  APTT     Status: None   Collection Time: 08/17/18  1:12 PM  Result Value Ref Range   aPTT 29 24 - 36 seconds    Comment: Performed at [facility] [address] [place], [place] [address]  Protime-INR     Status: None   Collection Time: 08/17/18   1:12 PM  Result Value Ref Range   Prothrombin Time 13.8 11.4 - 15.2 seconds   INR 1.1 0.8 - 1.2    Comment: (NOTE) INR goal varies based on device and disease states. Performed at [facility], [place] [address] [place], [place] [address]   C-reactive protein     Status: Abnormal   Collection Time: 08/17/18  3:04 PM  Result Value Ref Range   CRP 1.7 (H) <1.0 mg/dL    Comment: Performed at [facility], [place] [address] [place],  [address]    Mr Foot Left W Wo Contrast  Result Date: 08/17/2018 CLINICAL DATA:  Second toe swelling EXAM: MRI OF THE LEFT FOREFOOT WITHOUT AND WITH CONTRAST TECHNIQUE: Multiplanar, multisequence MR imaging of the left forefoot was performed both before and after administration of intravenous contrast. CONTRAST:  8 cc Gadavist COMPARISON:  Radiographs from August 17, 2018 FINDINGS: Bones/Joint/Cartilage Second toe distal interphalangeal joint severe arthropathy with erosions and destructive findings of the middle and distal phalanges. Appearance favors distal interphalangeal joint septic arthritis with osteomyelitis of the adjacent phalanges. There is associated considerable enhancement and edema both in the osseous structures and within along the joint. The proximal phalanx appears unremarkable as do the remaining toes. Ligaments Lisfranc ligament intact. Muscles and Tendons Unremarkable Soft tissues Extensive subcutaneous edema and enhancement in the second toe favoring cellulitis. Edema tracks proximally in the dorsum of the foot but without the same degree of enhancement as in the subcutaneous tissues of the toe. IMPRESSION: 1. Bony destructive findings along the distal interphalangeal joint of the second toe with prominent enhancement in the joint, in the adjacent middle and distal phalanges, and in the surrounding subcutaneous tissues. Given the lack of similar findings in other joints of the forefoot, the appearance is felt  to favor septic arthritis of  the second toe distal interphalangeal joint with associated osteomyelitis and cellulitis. Electronically Signed   By: Van Clines M.D.   On: 08/17/2018 17:08   Dg Toe 2nd Left  Result Date: 08/17/2018 CLINICAL DATA:  LEFT second toe pain and swelling for 2 weeks. No specific injury. EXAM: LEFT SECOND TOE COMPARISON:  None. FINDINGS: Prominent erosions and demineralization involving the osseous structures on either side of the second DIP joint. No soft tissue gas seen within the overlying soft tissues. IMPRESSION: Prominent erosions and demineralization involving the osseous structures on either side of the second DIP joint. This could represent an inflammatory arthritis or osteomyelitis. Electronically Signed   By: Franki Cabot M.D.   On: 08/17/2018 10:44    Review of Systems  All other systems reviewed and are negative.  Blood pressure (!) 139/118, pulse 67, temperature 98 F (36.7 C), temperature source Oral, resp. rate 14, height 5' 3.5" (1.613 m), weight 79.8 kg, SpO2 100 %. Physical Exam  Constitutional: She is oriented to person, place, and time. She appears well-developed and well-nourished.  HENT:  Head: Atraumatic.  Eyes: EOM are normal.  Cardiovascular: Intact distal pulses.  Respiratory: Effort normal.  Musculoskeletal:     Comments: Left second toe is swollen and erythematous.  Warm to the touch compared to the surrounding toes.  It's very prominent above the surrounding toes.  There is no apparent open wound or drainage.  She really has no tenderness or pain with motion.  Neurological: She is alert and oriented to person, place, and time.  Skin: Skin is warm and dry.  Psychiatric: She has a normal mood and affect.    Assessment/Plan: Left second toe septic arthritis, osteomyelitis with joint distraction I had a long discussion with Ashley Price about our options.  It's interesting that she really doesn't have any pain.  It doesn't seem to be making her sick but her white  count is elevated. I told her that with the amount of Bonydestruction seen on the x-ray and MRI antibiotic management is very unlikely to be successful and ultimately I think the only cure for this is going to be removed the toe. The toe has always been prominent and in the way and the other toes are already coming together beneath it.  I do not think she will miss it at all. After discussion of all the options she elected to go forward with amputation of the toe.  We will get this done tomorrow.  Once the toe is off she probably could just transitioning to a short course of an oral antibiotic until the wound heals and could likely be discharged home tomorrow afternoon after the surgery. She will be n.p.o. after midnight.  Ashley Price 08/17/2018, 7:38 PM

## 2018-08-17 NOTE — H&P (Signed)
History and Physical    Ashley Price:[id number] DOB: 05-Nov-1955 DOA: 08/17/2018  Referring MD/NP/PA: Mickle Plumb, PA-C PCP: Donald Prose, MD  Patient coming from: Home  Chief Complaint: Swelling of the left second toe  I have personally briefly reviewed patient's old medical records in [place]   HPI: Ashley Price is a 63 y.o. female with medical history significant of hypertension and osteoarthritis; who presents with complaints of swelling of the left second toe over the last 2 weeks.  Does not report any significant pain but has some discomfort when bearing weight on the affected foot.  Reports that it has been swelling and when it started to turn a dark in color became more concerned and came to the emergency department for further evaluation.  Denies having any fever, or drainage from the foot.  She previously reports stepping on a nail on the affected toe over 45 years ago.  She had been seen by podiatry who told the patient that she had a callus and must keep keep it shaved down below.  Proximately 2 weeks with the last time that she shaved it down and had received special padding from podiatry to help keep the toes separated.  Patient is also followed by Dr. Berenice Primas of [facility].  ED Course: Upon admission to the emergency department patient was noted to be afebrile with blood pressures elevated up to 189/99, and all other vital signs within normal limits.  Labs significant for WBC 15.7 and hemoglobin 15.5.  Urinalysis was negative for any significant signs of infection.  X-rays revealed erosions on both sides of the left second digit concerning for osteomyelitis or inflammatory arthritis.  COVID-19 negative.  MRI of the right toe was pending. Patient was treated with empiric antibiotics of vancomycin and cefepime.   .Review of Systems  Constitutional: Negative for chills and fever.  HENT: Negative for ear discharge and nosebleeds.   Eyes: Negative for double  vision and pain.  Respiratory: Negative for cough and shortness of breath.   Cardiovascular: Negative for chest pain and claudication.  Gastrointestinal: Negative for abdominal pain, nausea and vomiting.  Genitourinary: Negative for dysuria and urgency.  Musculoskeletal: Positive for joint pain. Negative for falls.  Skin: Negative for itching and rash.       Positive for skin color change.  Neurological: Negative for focal weakness and loss of consciousness.  Psychiatric/Behavioral: Negative for substance abuse.    Past Medical History:  Diagnosis Date  . Hypertension   . Osteoarthritis     Past Surgical History:  Procedure Laterality Date  . TUBAL LIGATION Bilateral      reports that she has been smoking cigarettes. She has never used smokeless tobacco. She reports that she does not drink alcohol or use drugs.  No Known Allergies  Family History  Problem Relation Age of Onset  . Breast cancer Mother   . Kidney failure Brother     Prior to Admission medications   Medication Sig Start Date End Date Taking? Authorizing Provider  diclofenac sodium (VOLTAREN) 1 % GEL Apply 2 g topically 4 (four) times daily. 03/29/17   Tasia Catchings, Amy V, PA-C  ibuprofen (ADVIL,MOTRIN) 800 MG tablet Take 1 tablet (800 mg total) by mouth 3 (three) times daily. 05/03/17   Zigmund Gottron, NP  lisinopril-hydrochlorothiazide (PRINZIDE,ZESTORETIC) 20-12.5 MG tablet Take 1 tablet by mouth daily.    [provider]  nabumetone (RELAFEN) 500 MG tablet Take 500 mg by mouth 2 (two) times daily.  [provider]    Physical Exam:  Constitutional: Older female in NAD, calm, comfortable Vitals:   08/17/18 0939 08/17/18 1319  BP: (!) 189/99   Pulse: 86   Resp: 12   Temp: 98 F (36.7 C)   SpO2: 100%   Weight:  79.8 kg  Height:  5' 3.5" (1.613 m)   Eyes: PERRL, lids and conjunctivae normal ENMT: Mucous membranes are moist. Posterior pharynx clear of any exudate or lesions.Normal  dentition.  Neck: normal, supple, no masses, no thyromegaly Respiratory: clear to auscultation bilaterally, no wheezing, no crackles. Normal respiratory effort. No accessory muscle use.  Cardiovascular: Regular rate and rhythm, no murmurs / rubs / gallops. No extremity edema. 2+ pedal pulses. No carotid bruits.  Abdomen: no tenderness, no masses palpated. No hepatosplenomegaly. Bowel sounds positive.  Musculoskeletal: no clubbing / cyanosis.  Crepitation present of the bilateral knees. Skin: Swelling and mild erythema noted of the left second toe with some darkening of the skin present. Neurologic: CN 2-12 grossly intact. Sensation intact, DTR normal. Strength 5/5 in all 4.  Psychiatric: Normal judgment and insight. Alert and oriented x 3. Normal mood.     Labs on Admission: I have personally reviewed following labs and imaging studies  CBC: Recent Labs  Lab 08/17/18 1147  WBC 15.7*  NEUTROABS 10.3*  HGB 15.5*  HCT 47.3*  MCV 89.2  PLT 564   Basic Metabolic Panel: Recent Labs  Lab 08/17/18 1147  NA 139  K 4.4  CL 108  CO2 20*  GLUCOSE 106*  BUN 9  CREATININE 0.91  CALCIUM 9.6   GFR: Estimated Creatinine Clearance: 63.2 mL/min (by C-G formula based on SCr of 0.91 mg/dL). Liver Function Tests: No results for input(s): AST, ALT, ALKPHOS, BILITOT, PROT, ALBUMIN in the last 168 hours. No results for input(s): LIPASE, AMYLASE in the last 168 hours. No results for input(s): AMMONIA in the last 168 hours. Coagulation Profile: Recent Labs  Lab 08/17/18 1312  INR 1.1   Cardiac Enzymes: No results for input(s): CKTOTAL, CKMB, CKMBINDEX, TROPONINI in the last 168 hours. BNP (last 3 results) No results for input(s): PROBNP in the last 8760 hours. HbA1C: No results for input(s): HGBA1C in the last 72 hours. CBG: No results for input(s): GLUCAP in the last 168 hours. Lipid Profile: No results for input(s): CHOL, HDL, LDLCALC, TRIG, CHOLHDL, LDLDIRECT in the last 72 hours.  Thyroid Function Tests: No results for input(s): TSH, T4TOTAL, FREET4, T3FREE, THYROIDAB in the last 72 hours. Anemia Panel: No results for input(s): VITAMINB12, FOLATE, FERRITIN, TIBC, IRON, RETICCTPCT in the last 72 hours. Urine analysis:    Component Value Date/Time   COLORURINE STRAW (A) 08/17/2018 1234   APPEARANCEUR CLEAR 08/17/2018 1234   LABSPEC 1.004 (L) 08/17/2018 1234   PHURINE 6.0 08/17/2018 1234   GLUCOSEU NEGATIVE 08/17/2018 1234   HGBUR NEGATIVE 08/17/2018 1234   BILIRUBINUR NEGATIVE 08/17/2018 [place] 08/17/2018 1234   PROTEINUR NEGATIVE 08/17/2018 1234   NITRITE NEGATIVE 08/17/2018 1234   LEUKOCYTESUR TRACE (A) 08/17/2018 1234   Sepsis Labs: No results found for this or any previous visit (from the past 240 hour(s)).   Radiological Exams on Admission: Dg Toe 2nd Left  Result Date: 08/17/2018 CLINICAL DATA:  LEFT second toe pain and swelling for 2 weeks. No specific injury. EXAM: LEFT SECOND TOE COMPARISON:  None. FINDINGS: Prominent erosions and demineralization involving the osseous structures on either side of the second DIP joint. No soft tissue gas seen within  the overlying soft tissues. IMPRESSION: Prominent erosions and demineralization involving the osseous structures on either side of the second DIP joint. This could represent an inflammatory arthritis or osteomyelitis. Electronically Signed   By: Franki Cabot M.D.   On: 08/17/2018 10:44    EKG: Independently reviewed.  Sinus rhythm at 66 bpm.  Assessment/Plan Suspected cellulitis with osteomyelitis of the left toe: Acute.  Presents with a two-week history of swelling of the left second digit.  X-rays reveal erosions and demineralization concerning for inflammatory arthritis versus osteomyelitis.  MRI of the left foot was ordered and pending. -Admit to medical telemetry bed -Follow-up blood culture -Add-on ESR and CRP -Follow-up MRI of the left foot -Continue antibiotics of vancomycin  and cefepime -[facility] consulted. Discussed case with Dr. Tamera Punt who recommends keeping patient n.p.o. after midnight.  Hypertensive urgency: Acute.  Blood pressure on admission elevated up to 189/99. -Continue home medications of lisinopril-hydrochlorothiazide -Hydralazine IV as needed  Leukocytosis: Acute.  On admission WBC 15.7.  Suspect secondary to infection in the toe. -Recheck CBC in a.m.  Osteoarthritis -Continue nabumetone  DVT prophylaxis: lovenox Code Status: Full  Family Communication: No family present at bedside Disposition Plan: Likely discharge home in 2 to 3 days Consults called: [facility] Admission status: Observation  Norval Morton MD [facility] Pager [phone]   If 7PM-7AM, please contact night-coverage [email or web address] Password [email or web address]  08/17/2018, 1:33 PM

## 2018-08-17 NOTE — H&P (View-Only) (Signed)
Reason for Consult:Evaluate left second toe infection Referring Physician: Dr. Alen Blew Ashley Price is an 63 y.o. female.  HPI: Ashley Price is a very pleasant 63 year old female who has about a 2 week history of swelling of the left second toe.  She notes that the toe has always been prominent sitting above the level of the surrounding toes which come together beneath it.  It tends to press against shoes and recently the nail plate was pulled off.  Over the last couple weeks she's noted increased swelling in the toe but surprisingly not had much pain.  She denies history of diabetes or other neuropathies.  She denies other affected small joints, although notes she has bad knees on both sides.  Past Medical History:  Diagnosis Date  . Hypertension   . Osteoarthritis     Past Surgical History:  Procedure Laterality Date  . TUBAL LIGATION Bilateral     Family History  Problem Relation Age of Onset  . Breast cancer Mother   . Kidney failure Brother     Social History:  reports that she has been smoking cigarettes. She has never used smokeless tobacco. She reports that she does not drink alcohol or use drugs.  Allergies: No Known Allergies  Medications: I have reviewed the patient's current medications.  Results for orders placed or performed during the hospital encounter of 08/17/18 (from the past 48 hour(s))  Basic metabolic panel     Status: Abnormal   Collection Time: 08/17/18 11:47 AM  Result Value Ref Range   Sodium 139 135 - 145 mmol/L   Potassium 4.4 3.5 - 5.1 mmol/L   Chloride 108 98 - 111 mmol/L   CO2 20 (L) 22 - 32 mmol/L   Glucose, Bld 106 (H) 70 - 99 mg/dL   BUN 9 8 - 23 mg/dL   Creatinine, Ser 0.91 0.44 - 1.00 mg/dL   Calcium 9.6 8.9 - 10.3 mg/dL   GFR calc non Af Amer >60 >60 mL/min   GFR calc Af Amer >60 >60 mL/min   Anion gap 11 5 - 15    Comment: Performed at [facility], [place] [address], [place], [place] [address]  CBC with Differential     Status:  Abnormal   Collection Time: 08/17/18 11:47 AM  Result Value Ref Range   WBC 15.7 (H) 4.0 - 10.5 K/uL   RBC 5.30 (H) 3.87 - 5.11 MIL/uL   Hemoglobin 15.5 (H) 12.0 - 15.0 g/dL   HCT 47.3 (H) 36.0 - 46.0 %   MCV 89.2 80.0 - 100.0 fL   MCH 29.2 26.0 - 34.0 pg   MCHC 32.8 30.0 - 36.0 g/dL   RDW 14.6 11.5 - 15.5 %   Platelets 273 150 - 400 K/uL   nRBC 0.0 0.0 - 0.2 %   Neutrophils Relative % 65 %   Neutro Abs 10.3 (H) 1.7 - 7.7 K/uL   Lymphocytes Relative 27 %   Lymphs Abs 4.3 (H) 0.7 - 4.0 K/uL   Monocytes Relative 6 %   Monocytes Absolute 0.9 0.1 - 1.0 K/uL   Eosinophils Relative 1 %   Eosinophils Absolute 0.1 0.0 - 0.5 K/uL   Basophils Relative 1 %   Basophils Absolute 0.1 0.0 - 0.1 K/uL   WBC Morphology VACUOLATED NEUTROPHILS    Immature Granulocytes 0 %   Abs Immature Granulocytes 0.06 0.00 - 0.07 K/uL    Comment: Performed at [facility], [place] [address], [place], [place] [address]  Sedimentation rate     Status: None   Collection Time: 08/17/18 11:47 AM  Result Value Ref Range   Sed Rate 16 0 - 22 mm/hr    Comment: Performed at [facility] [address] [place], [place] [address]  Urinalysis, Routine w reflex microscopic     Status: Abnormal   Collection Time: 08/17/18 12:34 PM  Result Value Ref Range   Color, Urine STRAW (A) YELLOW   APPearance CLEAR CLEAR   Specific Gravity, Urine 1.004 (L) 1.005 - 1.030   pH 6.0 5.0 - 8.0   Glucose, UA NEGATIVE NEGATIVE mg/dL   Hgb urine dipstick NEGATIVE NEGATIVE   Bilirubin Urine NEGATIVE NEGATIVE   Ketones, ur NEGATIVE NEGATIVE mg/dL   Protein, ur NEGATIVE NEGATIVE mg/dL   Nitrite NEGATIVE NEGATIVE   Leukocytes,Ua TRACE (A) NEGATIVE   RBC / HPF 0-5 0 - 5 RBC/hpf   WBC, UA 0-5 0 - 5 WBC/hpf   Bacteria, UA RARE (A) NONE SEEN   Squamous Epithelial / LPF 0-5 0 - 5    Comment: Performed at [facility], [address], [place], [place] [address]  SARS Coronavirus 2 (CEPHEID - Performed in [facility] hospital  lab), Hosp Order     Status: None   Collection Time: 08/17/18 12:41 PM   Specimen: Nasopharyngeal Swab  Result Value Ref Range   SARS Coronavirus 2 NEGATIVE NEGATIVE    Comment: (NOTE) If result is NEGATIVE SARS-CoV-2 target nucleic acids are NOT DETECTED. The SARS-CoV-2 RNA is generally detectable in upper and lower  respiratory specimens during the acute phase of infection. The lowest  concentration of SARS-CoV-2 viral copies this assay can detect is 250  copies / mL. A negative result does not preclude SARS-CoV-2 infection  and should not be used as the sole basis for treatment or other  patient management decisions.  A negative result may occur with  improper specimen collection / handling, submission of specimen other  than nasopharyngeal swab, presence of viral mutation(s) within the  areas targeted by this assay, and inadequate number of viral copies  (<250 copies / mL). A negative result must be combined with clinical  observations, patient history, and epidemiological information. If result is POSITIVE SARS-CoV-2 target nucleic acids are DETECTED. The SARS-CoV-2 RNA is generally detectable in upper and lower  respiratory specimens dur ing the acute phase of infection.  Positive  results are indicative of active infection with SARS-CoV-2.  Clinical  correlation with patient history and other diagnostic information is  necessary to determine patient infection status.  Positive results do  not rule out bacterial infection or co-infection with other viruses. If result is PRESUMPTIVE POSTIVE SARS-CoV-2 nucleic acids MAY BE PRESENT.   A presumptive positive result was obtained on the submitted specimen  and confirmed on repeat testing.  While 2019 novel coronavirus  (SARS-CoV-2) nucleic acids may be present in the submitted sample  additional confirmatory testing may be necessary for epidemiological  and / or clinical management purposes  to differentiate between  SARS-CoV-2 and  other Sarbecovirus currently known to infect humans.  If clinically indicated additional testing with an alternate test  methodology [phone]) is advised. The SARS-CoV-2 RNA is generally  detectable in upper and lower respiratory sp ecimens during the acute  phase of infection. The expected result is Negative. Fact Sheet for Patients:  StrictlyIdeas.no Fact Sheet for Healthcare Providers: BankingDealers.co.za This test is not yet approved or cleared by the [place] FDA and has  been authorized for detection and/or diagnosis of SARS-CoV-2 by FDA under an Emergency Use Authorization (EUA).  This EUA will remain in effect (meaning this test can be used) for the duration of the COVID-19 declaration under Section 564(b)(1) of the Act, 21 U.S.C. section 360bbb-3(b)(1), unless the authorization is terminated or revoked sooner. Performed at [facility], [place] [address] [place], [place] [address]   Hepatic function panel     Status: Abnormal   Collection Time: 08/17/18  1:12 PM  Result Value Ref Range   Total Protein 7.6 6.5 - 8.1 g/dL   Albumin 3.8 3.5 - 5.0 g/dL   AST 14 (L) 15 - 41 U/L   ALT 10 0 - 44 U/L   Alkaline Phosphatase 73 38 - 126 U/L   Total Bilirubin 0.4 0.3 - 1.2 mg/dL   Bilirubin, Direct 0.1 0.0 - 0.2 mg/dL   Indirect Bilirubin 0.3 0.3 - 0.9 mg/dL    Comment: Performed at [facility] [address], [place], [place] [address]  Lactic acid, plasma     Status: None   Collection Time: 08/17/18  1:12 PM  Result Value Ref Range   Lactic Acid, Venous 1.8 0.5 - 1.9 mmol/L    Comment: Performed at [facility] [address] [place], [place] [address]  APTT     Status: None   Collection Time: 08/17/18  1:12 PM  Result Value Ref Range   aPTT 29 24 - 36 seconds    Comment: Performed at [facility] [address], [place], [place] [address]  Protime-INR     Status: None   Collection Time: 08/17/18   1:12 PM  Result Value Ref Range   Prothrombin Time 13.8 11.4 - 15.2 seconds   INR 1.1 0.8 - 1.2    Comment: (NOTE) INR goal varies based on device and disease states. Performed at [facility], [place] [address] [place], [place] [address]   C-reactive protein     Status: Abnormal   Collection Time: 08/17/18  3:04 PM  Result Value Ref Range   CRP 1.7 (H) <1.0 mg/dL    Comment: Performed at [facility], [place] [address], [place], [place] [address]    Mr Foot Left W Wo Contrast  Result Date: 08/17/2018 CLINICAL DATA:  Second toe swelling EXAM: MRI OF THE LEFT FOREFOOT WITHOUT AND WITH CONTRAST TECHNIQUE: Multiplanar, multisequence MR imaging of the left forefoot was performed both before and after administration of intravenous contrast. CONTRAST:  8 cc Gadavist COMPARISON:  Radiographs from August 17, 2018 FINDINGS: Bones/Joint/Cartilage Second toe distal interphalangeal joint severe arthropathy with erosions and destructive findings of the middle and distal phalanges. Appearance favors distal interphalangeal joint septic arthritis with osteomyelitis of the adjacent phalanges. There is associated considerable enhancement and edema both in the osseous structures and within along the joint. The proximal phalanx appears unremarkable as do the remaining toes. Ligaments Lisfranc ligament intact. Muscles and Tendons Unremarkable Soft tissues Extensive subcutaneous edema and enhancement in the second toe favoring cellulitis. Edema tracks proximally in the dorsum of the foot but without the same degree of enhancement as in the subcutaneous tissues of the toe. IMPRESSION: 1. Bony destructive findings along the distal interphalangeal joint of the second toe with prominent enhancement in the joint, in the adjacent middle and distal phalanges, and in the surrounding subcutaneous tissues. Given the lack of similar findings in other joints of the forefoot, the appearance is felt  to favor septic arthritis of  the second toe distal interphalangeal joint with associated osteomyelitis and cellulitis. Electronically Signed   By: Van Clines M.D.   On: 08/17/2018 17:08   Dg Toe 2nd Left  Result Date: 08/17/2018 CLINICAL DATA:  LEFT second toe pain and swelling for 2 weeks. No specific injury. EXAM: LEFT SECOND TOE COMPARISON:  None. FINDINGS: Prominent erosions and demineralization involving the osseous structures on either side of the second DIP joint. No soft tissue gas seen within the overlying soft tissues. IMPRESSION: Prominent erosions and demineralization involving the osseous structures on either side of the second DIP joint. This could represent an inflammatory arthritis or osteomyelitis. Electronically Signed   By: Franki Cabot M.D.   On: 08/17/2018 10:44    Review of Systems  All other systems reviewed and are negative.  Blood pressure (!) 139/118, pulse 67, temperature 98 F (36.7 C), temperature source Oral, resp. rate 14, height 5' 3.5" (1.613 m), weight 79.8 kg, SpO2 100 %. Physical Exam  Constitutional: She is oriented to person, place, and time. She appears well-developed and well-nourished.  HENT:  Head: Atraumatic.  Eyes: EOM are normal.  Cardiovascular: Intact distal pulses.  Respiratory: Effort normal.  Musculoskeletal:     Comments: Left second toe is swollen and erythematous.  Warm to the touch compared to the surrounding toes.  It's very prominent above the surrounding toes.  There is no apparent open wound or drainage.  She really has no tenderness or pain with motion.  Neurological: She is alert and oriented to person, place, and time.  Skin: Skin is warm and dry.  Psychiatric: She has a normal mood and affect.    Assessment/Plan: Left second toe septic arthritis, osteomyelitis with joint distraction I had a long discussion with Ashley Price about our options.  It's interesting that she really doesn't have any pain.  It doesn't seem to be making her sick but her white  count is elevated. I told her that with the amount of Bonydestruction seen on the x-ray and MRI antibiotic management is very unlikely to be successful and ultimately I think the only cure for this is going to be removed the toe. The toe has always been prominent and in the way and the other toes are already coming together beneath it.  I do not think she will miss it at all. After discussion of all the options she elected to go forward with amputation of the toe.  We will get this done tomorrow.  Once the toe is off she probably could just transitioning to a short course of an oral antibiotic until the wound heals and could likely be discharged home tomorrow afternoon after the surgery. She will be n.p.o. after midnight.  Isabella Stalling 08/17/2018, 7:38 PM

## 2018-08-17 NOTE — ED Provider Notes (Signed)
[facility] EMERGENCY DEPARTMENT Provider Note   CSN: [id number] Arrival date & time: 08/17/18  1829    History   Chief Complaint Chief Complaint  Patient presents with  . Foot Pain    HPI Ashley Price is a 63 y.o. female with PMHx HTN and OA who presents to the ED today complaining of swellign to her 2nd toe on the left foot x 5 days. Pt denies any new pain to the toe. She reports about 45 years ago she stepped onto a nail near the 2nd toe and has had a callus ever since then. She has seen a podiatrist in the past who told her to keep the callus shaved down. Pt reports she has been doing this but the swelling to the toe is new. She has not been taking anything for pain as she doesn't have any new pain. Has been wearing more comfortable shoes given the swelling. Denies fever, chills, redness, drainage.        Past Medical History:  Diagnosis Date  . Hypertension   . Osteoarthritis     Patient Active Problem List   Diagnosis Date Noted  . Osteomyelitis ([place]) 08/17/2018    Past Surgical History:  Procedure Laterality Date  . TUBAL LIGATION Bilateral      OB History   No obstetric history on file.      Home Medications    Prior to Admission medications   Medication Sig Start Date End Date Taking? Authorizing Provider  diclofenac sodium (VOLTAREN) 1 % GEL Apply 2 g topically 4 (four) times daily. 03/29/17   Tasia Catchings, Amy V, PA-C  ibuprofen (ADVIL,MOTRIN) 800 MG tablet Take 1 tablet (800 mg total) by mouth 3 (three) times daily. 05/03/17   Zigmund Gottron, NP  lisinopril-hydrochlorothiazide (PRINZIDE,ZESTORETIC) 20-12.5 MG tablet Take 1 tablet by mouth daily.    [provider]  nabumetone (RELAFEN) 500 MG tablet Take 500 mg by mouth 2 (two) times daily.    [provider]    Family History No family history on file.  Social History Social History   Tobacco Use  . Smoking status: Current Every Day Smoker    Types: Cigarettes  .  Smokeless tobacco: Never Used  Substance Use Topics  . Alcohol use: No  . Drug use: No     Allergies   Patient has no known allergies.   Review of Systems Review of Systems  Constitutional: Negative for chills and fever.  Musculoskeletal: Positive for joint swelling (2nd left toe). Negative for arthralgias.     Physical Exam Updated Vital Signs BP (!) 189/99 Comment: hx of htn, did not take meds this a.m.  Pulse 86   Temp 98 F (36.7 C)   Resp 12   SpO2 100%   Physical Exam Vitals signs and nursing note reviewed.  Constitutional:      Appearance: She is not ill-appearing.  HENT:     Head: Normocephalic and atraumatic.  Eyes:     Conjunctiva/sclera: Conjunctivae normal.  Cardiovascular:     Rate and Rhythm: Normal rate and regular rhythm.  Pulmonary:     Effort: Pulmonary effort is normal.     Breath sounds: Normal breath sounds.  Musculoskeletal:     Comments: Moderate swelling noted to 2nd toe on left foot without erythema. No tenderness to palpation. Able to flex and extend toes without issue. Dorsiflexion and plantarflexion intact. Strength and sensation intact. Cap refill < 2 seconds. 2+ DP pulse.   Skin:  General: Skin is warm and dry.     Coloration: Skin is not jaundiced.  Neurological:     Mental Status: She is alert.        ED Treatments / Results  Labs (all labs ordered are listed, but only abnormal results are displayed) Labs Reviewed  BASIC METABOLIC PANEL - Abnormal; Notable for the following components:      Result Value   CO2 20 (*)    Glucose, Bld 106 (*)    All other components within normal limits  CBC WITH DIFFERENTIAL/PLATELET - Abnormal; Notable for the following components:   WBC 15.7 (*)    RBC 5.30 (*)    Hemoglobin 15.5 (*)    HCT 47.3 (*)    Neutro Abs 10.3 (*)    Lymphs Abs 4.3 (*)    All other components within normal limits  HEPATIC FUNCTION PANEL - Abnormal; Notable for the following components:   AST 14 (*)     All other components within normal limits  URINALYSIS, ROUTINE W REFLEX MICROSCOPIC - Abnormal; Notable for the following components:   Color, Urine STRAW (*)    Specific Gravity, Urine 1.004 (*)    Leukocytes,Ua TRACE (*)    Bacteria, UA RARE (*)    All other components within normal limits  C-REACTIVE PROTEIN - Abnormal; Notable for the following components:   CRP 1.7 (*)    All other components within normal limits  SARS CORONAVIRUS 2 (HOSPITAL ORDER, [facility])  CULTURE, BLOOD (ROUTINE X 2)  CULTURE, BLOOD (ROUTINE X 2)  URINE CULTURE  LACTIC ACID, PLASMA  APTT  PROTIME-INR  SEDIMENTATION RATE    EKG EKG Interpretation  Date/Time:  Saturday August 17 2018 12:56:15 EDT Ventricular Rate:  66 PR Interval:    QRS Duration: 94 QT Interval:  405 QTC Calculation: 425 R Axis:   56 Text Interpretation:  Sinus rhythm Borderline repolarization abnormality Baseline wander in lead(s) V6 No old tracing to compare Confirmed by Daleen Bo [phone]) on 08/17/2018 1:08:44 PM   Radiology Dg Toe 2nd Left  Result Date: 08/17/2018 CLINICAL DATA:  LEFT second toe pain and swelling for 2 weeks. No specific injury. EXAM: LEFT SECOND TOE COMPARISON:  None. FINDINGS: Prominent erosions and demineralization involving the osseous structures on either side of the second DIP joint. No soft tissue gas seen within the overlying soft tissues. IMPRESSION: Prominent erosions and demineralization involving the osseous structures on either side of the second DIP joint. This could represent an inflammatory arthritis or osteomyelitis. Electronically Signed   By: Franki Cabot M.D.   On: 08/17/2018 10:44    Procedures Procedures (including critical care time)  Medications Ordered in ED Medications  enoxaparin (LOVENOX) injection 40 mg (40 mg Subcutaneous Given 08/17/18 1511)  sodium chloride flush (NS) 0.9 % injection 3 mL (3 mLs Intravenous Given 08/17/18 1403)  ondansetron  (ZOFRAN) tablet 4 mg (has no administration in time range)    Or  ondansetron (ZOFRAN) injection 4 mg (has no administration in time range)  acetaminophen (TYLENOL) tablet 650 mg (has no administration in time range)    Or  acetaminophen (TYLENOL) suppository 650 mg (has no administration in time range)  hydrALAZINE (APRESOLINE) injection 10 mg (10 mg Intravenous Given 08/17/18 1418)  vancomycin (VANCOCIN) 1,500 mg in sodium chloride 0.9 % 500 mL IVPB (1,500 mg Intravenous Transfusing/Transfer 08/17/18 1423)  vancomycin (VANCOCIN) IVPB 1000 mg/200 mL premix (has no administration in time range)  lisinopril (ZESTRIL) tablet 20  mg (20 mg Oral Given 08/17/18 1510)    And  hydrochlorothiazide (MICROZIDE) capsule 12.5 mg (12.5 mg Oral Given 08/17/18 1511)  ceFEPIme (MAXIPIME) 2 g in sodium chloride 0.9 % 100 mL IVPB (0 g Intravenous Stopped 08/17/18 1413)     Initial Impression / Assessment and Plan / ED Course  I have reviewed the triage vital signs and the nursing notes.  Pertinent labs & imaging results that were available during my care of the patient were reviewed by me and considered in my medical decision making (see chart for details).    63 year old female who presents with left 2nd toe swelling. No known injury. No pain. Reports callus formation after stepping onto a nail 45 years ago to plantar aspect. Pt does have moderate swelling onto to specified toe without tenderness. No swelling beyond MTP joint. Good distal pulses. Able to ambulate without difficulty. Will obtain xray today and likely refer patient back to podiatry vs ortho. Pt is in agreement with plan.   Xrays with concern for inflammatory arthritis vs osteo. Will discuss case with attending physician Dr. Melina Copa to get further reccs. Pt may likely need CT scan vs MRI to rule out osteo. She again denies fever or chills at home. She is afebrile in the ED without tachycardia or tachypnea. No hx of diabetes.   Dr. Melina Copa suggested CT  scan although CT tech called and reported that radiologist recommends MRI for definitive diagnosis. Will change order at this time. Will also obtain bloodwork today to check for leukocytosis. Pt may need to be admitted for IV abx.   CBC with leukocytosis of 15.9. Will start IV abx at this time; cefdinir and vanc. Will order more labwork including lactic acid and blood cultures. Pt will need to be admitted; will call hospitalist to see if she can be admitted prior to MRI.   Discussed case with Dr. Tamala Julian who agrees to accept for admission.        Final Clinical Impressions(s) / ED Diagnoses   Final diagnoses:  Pain and swelling of toe of left foot    ED Discharge Orders    None       Eustaquio Maize, PA-C 08/17/18 1610    Hayden Rasmussen, MD 08/18/18 [phone]

## 2018-08-17 NOTE — Progress Notes (Signed)
Pharmacy Antibiotic Note  Ashley Price is a 63 y.o. female admitted on 08/17/2018 with osteomyelitis.  Pharmacy has been consulted for vancomycin dosing.  Pt presents with swollen 2nd toe on left foot x 5 days without erythema and pain. Concern for osteomyelitis versus inflammatory arthritis. WBC elevated at 15.7. afebrile. creatinine 0.91.   Plan: Vancomycin 1500 IV x 1  Vancomycin 1000 mg IV every 24 hours  Monitor peak and trough for AUC    Temp (24hrs), Avg:98 F (36.7 C), Min:98 F (36.7 C), Max:98 F (36.7 C)  Recent Labs  Lab 08/17/18 1147 08/17/18 1312  WBC 15.7*  --   CREATININE 0.91  --   LATICACIDVEN  --  1.8    Estimated Creatinine Clearance: 63.2 mL/min (by C-G formula based on SCr of 0.91 mg/dL).    No Known Allergies  Antimicrobials this admission: Vancomycin 7/18 >>  Cefepime 7/18 >>    Dose adjustments this admission: N/A  Microbiology results: 7/18 BCx: pending  7/18 UCx: negative     Thank you for allowing pharmacy to be a part of this patient's care.   Eddie Candle, PharmD PGY-1 Pharmacy Resident  Phone: [phone]

## 2018-08-17 NOTE — ED Notes (Signed)
Patient transported to X-ray 

## 2018-08-17 NOTE — ED Notes (Signed)
ED TO INPATIENT HANDOFF REPORT  ED Nurse Name and Phone #: Judson Roch [phone]  S Name/Age/Gender Ashley Price 63 y.o. female Room/Bed: 003C/003C  Code Status   Code Status: Full Code  Home/SNF/Other Home Patient oriented to: self, place, time and situation Is this baseline? Yes   Triage Complete: Triage complete  Chief Complaint toe swollen  Triage Note Pt reports swelling to 2nd toe of Left foot x5 days. Reports that she stepped on a nail many years ago that caused a callus and she thinks pain is related. States she has seen a podiatrist that told her to keep the callus shaved down.    Allergies No Known Allergies  Level of Care/Admitting Diagnosis ED Disposition    ED Disposition Condition Comment   Admit  Hospital Area: [facility] [[id number]]  Level of Care: Telemetry Medical [104]  I expect the patient will be discharged within 24 hours: No (not a candidate for 5C-Observation unit)  Covid Evaluation: Asymptomatic Screening Protocol (No Symptoms)  Diagnosis: Osteomyelitis [facility]) [100712]  Admitting Physician: ALLYSHA, TRYON [[id number]]  Attending Physician: Norval Morton [[id number]]  PT Class (Do Not Modify): Observation [104]  PT Acc Code (Do Not Modify): Observation [10022]       B Medical/Surgery History Past Medical History:  Diagnosis Date  . Hypertension   . Osteoarthritis    Past Surgical History:  Procedure Laterality Date  . TUBAL LIGATION Bilateral      A IV Location/Drains/Wounds Patient Lines/Drains/Airways Status   Active Line/Drains/Airways    Name:   Placement date:   Placement time:   Site:   Days:   Peripheral IV 08/17/18 Right Hand   08/17/18    1130    Hand   less than 1          Intake/Output Last 24 hours No intake or output data in the 24 hours ending 08/17/18 1404  Labs/Imaging Results for orders placed or performed during the hospital encounter of 08/17/18 (from the past 48 hour(s))  Basic metabolic panel      Status: Abnormal   Collection Time: 08/17/18 11:47 AM  Result Value Ref Range   Sodium 139 135 - 145 mmol/L   Potassium 4.4 3.5 - 5.1 mmol/L   Chloride 108 98 - 111 mmol/L   CO2 20 (L) 22 - 32 mmol/L   Glucose, Bld 106 (H) 70 - 99 mg/dL   BUN 9 8 - 23 mg/dL   Creatinine, Ser 0.91 0.44 - 1.00 mg/dL   Calcium 9.6 8.9 - 10.3 mg/dL   GFR calc non Af Amer >60 >60 mL/min   GFR calc Af Amer >60 >60 mL/min   Anion gap 11 5 - 15    Comment: Performed at [facility], [place] [address] [place],  [address]  CBC with Differential     Status: Abnormal   Collection Time: 08/17/18 11:47 AM  Result Value Ref Range   WBC 15.7 (H) 4.0 - 10.5 K/uL   RBC 5.30 (H) 3.87 - 5.11 MIL/uL   Hemoglobin 15.5 (H) 12.0 - 15.0 g/dL   HCT 47.3 (H) 36.0 - 46.0 %   MCV 89.2 80.0 - 100.0 fL   MCH 29.2 26.0 - 34.0 pg   MCHC 32.8 30.0 - 36.0 g/dL   RDW 14.6 11.5 - 15.5 %   Platelets 273 150 - 400 K/uL   nRBC 0.0 0.0 - 0.2 %   Neutrophils Relative % 65 %   Neutro Abs 10.3 (  H) 1.7 - 7.7 K/uL   Lymphocytes Relative 27 %   Lymphs Abs 4.3 (H) 0.7 - 4.0 K/uL   Monocytes Relative 6 %   Monocytes Absolute 0.9 0.1 - 1.0 K/uL   Eosinophils Relative 1 %   Eosinophils Absolute 0.1 0.0 - 0.5 K/uL   Basophils Relative 1 %   Basophils Absolute 0.1 0.0 - 0.1 K/uL   WBC Morphology VACUOLATED NEUTROPHILS    Immature Granulocytes 0 %   Abs Immature Granulocytes 0.06 0.00 - 0.07 K/uL    Comment: Performed at [facility] [address] [place], [place] [address]  Urinalysis, Routine w reflex microscopic     Status: Abnormal   Collection Time: 08/17/18 12:34 PM  Result Value Ref Range   Color, Urine STRAW (A) YELLOW   APPearance CLEAR CLEAR   Specific Gravity, Urine 1.004 (L) 1.005 - 1.030   pH 6.0 5.0 - 8.0   Glucose, UA NEGATIVE NEGATIVE mg/dL   Hgb urine dipstick NEGATIVE NEGATIVE   Bilirubin Urine NEGATIVE NEGATIVE   Ketones, ur NEGATIVE NEGATIVE mg/dL   Protein, ur NEGATIVE NEGATIVE mg/dL    Nitrite NEGATIVE NEGATIVE   Leukocytes,Ua TRACE (A) NEGATIVE   RBC / HPF 0-5 0 - 5 RBC/hpf   WBC, UA 0-5 0 - 5 WBC/hpf   Bacteria, UA RARE (A) NONE SEEN   Squamous Epithelial / LPF 0-5 0 - 5    Comment: Performed at [facility], [address] [place], [place] [address]  Hepatic function panel     Status: Abnormal   Collection Time: 08/17/18  1:12 PM  Result Value Ref Range   Total Protein 7.6 6.5 - 8.1 g/dL   Albumin 3.8 3.5 - 5.0 g/dL   AST 14 (L) 15 - 41 U/L   ALT 10 0 - 44 U/L   Alkaline Phosphatase 73 38 - 126 U/L   Total Bilirubin 0.4 0.3 - 1.2 mg/dL   Bilirubin, Direct 0.1 0.0 - 0.2 mg/dL   Indirect Bilirubin 0.3 0.3 - 0.9 mg/dL    Comment: Performed at [facility] [address], [place], [place] [address]  Lactic acid, plasma     Status: None   Collection Time: 08/17/18  1:12 PM  Result Value Ref Range   Lactic Acid, Venous 1.8 0.5 - 1.9 mmol/L    Comment: Performed at [facility] [address] [place], [place] [address]  APTT     Status: None   Collection Time: 08/17/18  1:12 PM  Result Value Ref Range   aPTT 29 24 - 36 seconds    Comment: Performed at [facility] [address] [place], [place] [address]  Protime-INR     Status: None   Collection Time: 08/17/18  1:12 PM  Result Value Ref Range   Prothrombin Time 13.8 11.4 - 15.2 seconds   INR 1.1 0.8 - 1.2    Comment: (NOTE) INR goal varies based on device and disease states. Performed at [facility], [place] [address], [place], [place] [address]    Dg Toe 2nd Left  Result Date: 08/17/2018 CLINICAL DATA:  LEFT second toe pain and swelling for 2 weeks. No specific injury. EXAM: LEFT SECOND TOE COMPARISON:  None. FINDINGS: Prominent erosions and demineralization involving the osseous structures on either side of the second DIP joint. No soft tissue gas seen within the overlying soft tissues. IMPRESSION: Prominent erosions and demineralization involving the osseous structures on  either side of the second DIP joint. This could represent an inflammatory arthritis or osteomyelitis. Electronically Signed   By: Franki Cabot M.D.   On: 08/17/2018 10:44    Pending Labs Unresulted Labs (From admission, onward)    Start     Ordered   08/18/18 0500  HIV antibody (Routine Testing)  Tomorrow morning,   R     08/17/18 1347   08/18/18 0500  CBC  Tomorrow morning,   R     08/17/18 1347   08/18/18 0175  Basic metabolic panel  Tomorrow morning,   R     08/17/18 1347   08/17/18 1345  Sedimentation rate  Add-on,   AD     08/17/18 1347   08/17/18 1344  C-reactive protein  Add-on,   AD     08/17/18 1347   08/17/18 1241  SARS Coronavirus 2 (CEPHEID - Performed in [facility] hospital lab), Hosp Order  (Asymptomatic Patients Labs)  Once,   STAT    Question:  Rule Out  Answer:  Yes   08/17/18 1240   08/17/18 1220  Blood Culture (routine x 2)  BLOOD CULTURE X 2,   STAT     08/17/18 1222   08/17/18 1220  Urine culture  ONCE - STAT,   STAT     08/17/18 1222          Vitals/Pain Today's Vitals   08/17/18 1301 08/17/18 1319 08/17/18 1341 08/17/18 1345  BP: (!) 193/102  (!) 191/96 (!) 174/106  Pulse: 69  60 (!) 56  Resp: 18  12 11   Temp:      SpO2: 99%  100% 100%  Weight:  79.8 kg    Height:  5' 3.5" (1.613 m)    PainSc:        Isolation Precautions No active isolations  Medications Medications  ceFEPIme (MAXIPIME) 2 g in sodium chloride 0.9 % 100 mL IVPB (2 g Intravenous New Bag/Given 08/17/18 1343)  enoxaparin (LOVENOX) injection 40 mg (has no administration in time range)  sodium chloride flush (NS) 0.9 % injection 3 mL (3 mLs Intravenous Given 08/17/18 1403)  ondansetron (ZOFRAN) tablet 4 mg (has no administration in time range)    Or  ondansetron (ZOFRAN) injection 4 mg (has no administration in time range)  acetaminophen (TYLENOL) tablet 650 mg (has no administration in time range)    Or  acetaminophen (TYLENOL) suppository 650 mg (has no administration in time  range)  hydrALAZINE (APRESOLINE) injection 10 mg (has no administration in time range)  vancomycin (VANCOCIN) 1,500 mg in sodium chloride 0.9 % 500 mL IVPB (has no administration in time range)  vancomycin (VANCOCIN) IVPB 1000 mg/200 mL premix (has no administration in time range)  lisinopril (ZESTRIL) tablet 20 mg (has no administration in time range)    And  hydrochlorothiazide (MICROZIDE) capsule 12.5 mg (has no administration in time range)    Mobility walks Moderate fall risk   Focused Assessments musculoskeletal   R Recommendations: See Admitting Provider Note  Report given to:   Additional Notes:

## 2018-08-18 ENCOUNTER — Encounter (HOSPITAL_COMMUNITY)
Admission: EM | Disposition: A | Discharge status: Home / Self Care | Payer: Self-pay | Source: Home / Self Care | Attending: Emergency Medicine

## 2018-08-18 ENCOUNTER — Observation Stay (HOSPITAL_COMMUNITY): Payer: Medicare HMO | Admitting: Anesthesiology

## 2018-08-18 ENCOUNTER — Encounter (HOSPITAL_COMMUNITY): Payer: Self-pay | Admitting: Surgery

## 2018-08-18 DIAGNOSIS — Z791 Long term (current) use of non-steroidal anti-inflammatories (NSAID): Secondary | ICD-10-CM | POA: Diagnosis not present

## 2018-08-18 DIAGNOSIS — M869 Osteomyelitis, unspecified: Secondary | ICD-10-CM | POA: Diagnosis not present

## 2018-08-18 DIAGNOSIS — M868X7 Other osteomyelitis, ankle and foot: Secondary | ICD-10-CM | POA: Diagnosis not present

## 2018-08-18 DIAGNOSIS — D72829 Elevated white blood cell count, unspecified: Secondary | ICD-10-CM | POA: Diagnosis not present

## 2018-08-18 DIAGNOSIS — M86172 Other acute osteomyelitis, left ankle and foot: Secondary | ICD-10-CM | POA: Diagnosis not present

## 2018-08-18 DIAGNOSIS — M1990 Unspecified osteoarthritis, unspecified site: Secondary | ICD-10-CM | POA: Diagnosis not present

## 2018-08-18 DIAGNOSIS — M009 Pyogenic arthritis, unspecified: Secondary | ICD-10-CM | POA: Diagnosis not present

## 2018-08-18 DIAGNOSIS — Z1159 Encounter for screening for other viral diseases: Secondary | ICD-10-CM | POA: Diagnosis not present

## 2018-08-18 DIAGNOSIS — L089 Local infection of the skin and subcutaneous tissue, unspecified: Secondary | ICD-10-CM | POA: Diagnosis not present

## 2018-08-18 DIAGNOSIS — I160 Hypertensive urgency: Secondary | ICD-10-CM | POA: Diagnosis not present

## 2018-08-18 DIAGNOSIS — R69 Illness, unspecified: Secondary | ICD-10-CM | POA: Diagnosis not present

## 2018-08-18 DIAGNOSIS — M79675 Pain in left toe(s): Secondary | ICD-10-CM | POA: Diagnosis not present

## 2018-08-18 DIAGNOSIS — I10 Essential (primary) hypertension: Secondary | ICD-10-CM | POA: Diagnosis not present

## 2018-08-18 HISTORY — PX: AMPUTATION: SHX166

## 2018-08-18 LAB — CBC
HCT: 41.8 % (ref 36.0–46.0)
Hemoglobin: 14.1 g/dL (ref 12.0–15.0)
MCH: 29.4 pg (ref 26.0–34.0)
MCHC: 33.7 g/dL (ref 30.0–36.0)
MCV: 87.3 fL (ref 80.0–100.0)
Platelets: 292 10*3/uL (ref 150–400)
RBC: 4.79 MIL/uL (ref 3.87–5.11)
RDW: 14.6 % (ref 11.5–15.5)
WBC: 12.6 10*3/uL — ABNORMAL HIGH (ref 4.0–10.5)
nRBC: 0 % (ref 0.0–0.2)

## 2018-08-18 LAB — BASIC METABOLIC PANEL
Anion gap: 11 (ref 5–15)
BUN: 14 mg/dL (ref 8–23)
CO2: 22 mmol/L (ref 22–32)
Calcium: 9.6 mg/dL (ref 8.9–10.3)
Chloride: 106 mmol/L (ref 98–111)
Creatinine, Ser: 0.91 mg/dL (ref 0.44–1.00)
GFR calc Af Amer: >60 mL/min (ref >60)
GFR calc non Af Amer: >60 mL/min (ref >60)
Glucose, Bld: 113 mg/dL — ABNORMAL HIGH (ref 70–99)
Potassium: 3.6 mmol/L (ref 3.5–5.1)
Sodium: 139 mmol/L (ref 135–145)

## 2018-08-18 LAB — SURGICAL PCR SCREEN
MRSA, PCR: NEGATIVE
Staphylococcus aureus: NEGATIVE

## 2018-08-18 LAB — HIV ANTIBODY (ROUTINE TESTING W REFLEX): HIV Screen 4th Generation wRfx: NONREACTIVE

## 2018-08-18 LAB — URINE CULTURE: Culture: NO GROWTH

## 2018-08-18 SURGERY — AMPUTATION DIGIT
Anesthesia: Regional | Site: Foot | Laterality: Left

## 2018-08-18 MED ORDER — ACETAMINOPHEN 325 MG PO TABS: 325.0000 mg | ORAL_TABLET | Freq: Four times a day (QID) | ORAL | Status: DC | PRN

## 2018-08-18 MED ORDER — ONDANSETRON HCL 4 MG/2ML IJ SOLN
INTRAMUSCULAR | Status: DC | PRN
  Administered 2018-08-18: 4 mg via INTRAVENOUS

## 2018-08-18 MED ORDER — MIDAZOLAM HCL 2 MG/2ML IJ SOLN
INTRAMUSCULAR | Status: DC | PRN
  Administered 2018-08-18 (×2): 1 mg via INTRAVENOUS

## 2018-08-18 MED ORDER — HYDROMORPHONE HCL 1 MG/ML IJ SOLN: 0.2500 mg | INTRAMUSCULAR | Status: DC | PRN

## 2018-08-18 MED ORDER — PROPOFOL 1000 MG/100ML IV EMUL
INTRAVENOUS | Status: AC
  Filled 2018-08-18: qty 100

## 2018-08-18 MED ORDER — HYDROCODONE-ACETAMINOPHEN 5-325 MG PO TABS: 1.0000 | ORAL_TABLET | ORAL | Status: DC | PRN

## 2018-08-18 MED ORDER — PROPOFOL 500 MG/50ML IV EMUL
INTRAVENOUS | Status: DC | PRN
  Administered 2018-08-18: 100 ug/kg/min via INTRAVENOUS

## 2018-08-18 MED ORDER — CEFAZOLIN SODIUM 1 G IJ SOLR
INTRAMUSCULAR | Status: AC
  Filled 2018-08-18: qty 10

## 2018-08-18 MED ORDER — METOCLOPRAMIDE HCL 5 MG/ML IJ SOLN: 5.0000 mg | Freq: Three times a day (TID) | INTRAMUSCULAR | Status: DC | PRN

## 2018-08-18 MED ORDER — MEPERIDINE HCL 25 MG/ML IJ SOLN: 6.2500 mg | INTRAMUSCULAR | Status: DC | PRN

## 2018-08-18 MED ORDER — PROMETHAZINE HCL 25 MG/ML IJ SOLN: 6.2500 mg | INTRAMUSCULAR | Status: DC | PRN

## 2018-08-18 MED ORDER — ONDANSETRON HCL 4 MG PO TABS
4.0000 mg | ORAL_TABLET | Freq: Four times a day (QID) | ORAL | Status: DC | PRN
  Filled 2018-08-18: qty 1

## 2018-08-18 MED ORDER — MIDAZOLAM HCL 2 MG/2ML IJ SOLN: 0.5000 mg | Freq: Once | INTRAMUSCULAR | Status: DC | PRN

## 2018-08-18 MED ORDER — FENTANYL CITRATE (PF) 250 MCG/5ML IJ SOLN
INTRAMUSCULAR | Status: AC
  Filled 2018-08-18: qty 5

## 2018-08-18 MED ORDER — PHENYLEPHRINE 40 MCG/ML (10ML) SYRINGE FOR IV PUSH (FOR BLOOD PRESSURE SUPPORT)
PREFILLED_SYRINGE | INTRAVENOUS | Status: AC
  Filled 2018-08-18: qty 10

## 2018-08-18 MED ORDER — CEPHALEXIN 500 MG PO CAPS: 500.0000 mg | ORAL_CAPSULE | Freq: Four times a day (QID) | ORAL | Status: DC

## 2018-08-18 MED ORDER — METOCLOPRAMIDE HCL 5 MG PO TABS
5.0000 mg | ORAL_TABLET | Freq: Three times a day (TID) | ORAL | Status: DC | PRN
  Filled 2018-08-18: qty 2

## 2018-08-18 MED ORDER — FENTANYL CITRATE (PF) 250 MCG/5ML IJ SOLN
INTRAMUSCULAR | Status: DC | PRN
  Administered 2018-08-18: 50 ug via INTRAVENOUS
  Administered 2018-08-18 (×2): 25 ug via INTRAVENOUS

## 2018-08-18 MED ORDER — 0.9 % SODIUM CHLORIDE (POUR BTL) OPTIME
TOPICAL | Status: DC | PRN
  Administered 2018-08-18: 08:00:00 1000 mL

## 2018-08-18 MED ORDER — POVIDONE-IODINE 10 % EX SWAB: 2.0000 "application " | Freq: Once | CUTANEOUS | Status: DC

## 2018-08-18 MED ORDER — PROPOFOL 10 MG/ML IV BOLUS
INTRAVENOUS | Status: AC
  Filled 2018-08-18: qty 20

## 2018-08-18 MED ORDER — PHENYLEPHRINE 40 MCG/ML (10ML) SYRINGE FOR IV PUSH (FOR BLOOD PRESSURE SUPPORT)
PREFILLED_SYRINGE | INTRAVENOUS | Status: DC | PRN
  Administered 2018-08-18 (×2): 120 ug via INTRAVENOUS

## 2018-08-18 MED ORDER — ONDANSETRON HCL 4 MG/2ML IJ SOLN
INTRAMUSCULAR | Status: AC
  Filled 2018-08-18: qty 2

## 2018-08-18 MED ORDER — DOCUSATE SODIUM 100 MG PO CAPS: 100.0000 mg | ORAL_CAPSULE | Freq: Two times a day (BID) | ORAL | Status: DC

## 2018-08-18 MED ORDER — ONDANSETRON HCL 4 MG/2ML IJ SOLN
4.0000 mg | Freq: Four times a day (QID) | INTRAMUSCULAR | Status: DC | PRN
  Filled 2018-08-18: qty 2

## 2018-08-18 MED ORDER — HYDROCODONE-ACETAMINOPHEN 7.5-325 MG PO TABS: 1.0000 | ORAL_TABLET | ORAL | Status: DC | PRN

## 2018-08-18 MED ORDER — PROPOFOL 10 MG/ML IV BOLUS
INTRAVENOUS | Status: DC | PRN
  Administered 2018-08-18 (×3): 20 mg via INTRAVENOUS
  Administered 2018-08-18: 90 mg via INTRAVENOUS

## 2018-08-18 MED ORDER — BUPIVACAINE HCL (PF) 0.5 % IJ SOLN
INTRAMUSCULAR | Status: DC | PRN
  Administered 2018-08-18: 40 mL

## 2018-08-18 MED ORDER — MORPHINE SULFATE (PF) 2 MG/ML IV SOLN: 0.5000 mg | INTRAVENOUS | Status: DC | PRN

## 2018-08-18 MED ORDER — MIDAZOLAM HCL 2 MG/2ML IJ SOLN
INTRAMUSCULAR | Status: AC
  Filled 2018-08-18: qty 2

## 2018-08-18 MED ORDER — CEPHALEXIN 500 MG PO CAPS: 500.0000 mg | ORAL_CAPSULE | Freq: Four times a day (QID) | ORAL | 0 refills | Status: AC

## 2018-08-18 MED ORDER — LACTATED RINGERS IV SOLN
INTRAVENOUS | Status: DC
  Administered 2018-08-18: 08:00:00 via INTRAVENOUS

## 2018-08-18 SURGICAL SUPPLY — 42 items
BLADE SURG 10 STRL SS (BLADE) ×2 IMPLANT
BNDG COHESIVE 2X5 TAN STRL LF (GAUZE/BANDAGES/DRESSINGS) ×1 IMPLANT
BNDG COHESIVE 4X5 TAN STRL (GAUZE/BANDAGES/DRESSINGS) IMPLANT
BNDG CONFORM 2 STRL LF (GAUZE/BANDAGES/DRESSINGS) ×1 IMPLANT
BNDG GAUZE ELAST 4 BULKY (GAUZE/BANDAGES/DRESSINGS) ×1 IMPLANT
CHLORAPREP W/TINT 10.5 ML (MISCELLANEOUS) ×2 IMPLANT
CONT SPECI 4OZ STER CLIK (MISCELLANEOUS) ×1 IMPLANT
COVER SURGICAL LIGHT HANDLE (MISCELLANEOUS) ×2 IMPLANT
COVER WAND RF STERILE (DRAPES) ×1 IMPLANT
CUFF TOURN SGL QUICK 18X4 (TOURNIQUET CUFF) ×1 IMPLANT
CUFF TOURN SGL QUICK 34 (TOURNIQUET CUFF)
CUFF TOURN SGL QUICK 42 (TOURNIQUET CUFF) IMPLANT
CUFF TRNQT CYL 34X4.125X (TOURNIQUET CUFF) IMPLANT
DRAPE SURG 17X23 STRL (DRAPES) ×2 IMPLANT
DRAPE U-SHAPE 47X51 STRL (DRAPES) ×1 IMPLANT
DRSG ADAPTIC 3X8 NADH LF (GAUZE/BANDAGES/DRESSINGS) ×1 IMPLANT
DRSG PAD ABDOMINAL 8X10 ST (GAUZE/BANDAGES/DRESSINGS) ×2 IMPLANT
DRSG XEROFORM 1X8 (GAUZE/BANDAGES/DRESSINGS) ×1 IMPLANT
ELECT REM PT RETURN 9FT ADLT (ELECTROSURGICAL) ×2
ELECTRODE REM PT RTRN 9FT ADLT (ELECTROSURGICAL) ×1 IMPLANT
GAUZE SPONGE 4X4 12PLY STRL (GAUZE/BANDAGES/DRESSINGS) ×2 IMPLANT
GLOVE BIO SURGEON STRL SZ7 (GLOVE) ×3 IMPLANT
GLOVE BIO SURGEON STRL SZ7.5 (GLOVE) ×2 IMPLANT
GLOVE BIOGEL PI IND STRL 8 (GLOVE) ×1 IMPLANT
GLOVE BIOGEL PI INDICATOR 8 (GLOVE) ×1
GOWN STRL REUS W/ TWL LRG LVL3 (GOWN DISPOSABLE) ×3 IMPLANT
GOWN STRL REUS W/TWL LRG LVL3 (GOWN DISPOSABLE) ×3
KIT BASIN OR (CUSTOM PROCEDURE TRAY) ×2 IMPLANT
KIT TURNOVER KIT B (KITS) ×2 IMPLANT
MANIFOLD NEPTUNE II (INSTRUMENTS) ×2 IMPLANT
NS IRRIG 1000ML POUR BTL (IV SOLUTION) ×2 IMPLANT
PACK ORTHO EXTREMITY (CUSTOM PROCEDURE TRAY) ×2 IMPLANT
PAD ARMBOARD 7.5X6 YLW CONV (MISCELLANEOUS) ×4 IMPLANT
SPONGE LAP 18X18 RF (DISPOSABLE) ×1 IMPLANT
STOCKINETTE IMPERVIOUS 9X36 MD (GAUZE/BANDAGES/DRESSINGS) ×1 IMPLANT
SUT ETHILON 3 0 PS 1 (SUTURE) ×2 IMPLANT
SWAB CULTURE ESWAB REG 1ML (MISCELLANEOUS) IMPLANT
TOWEL GREEN STERILE (TOWEL DISPOSABLE) ×2 IMPLANT
TOWEL GREEN STERILE FF (TOWEL DISPOSABLE) ×2 IMPLANT
TUBE CONNECTING 12X1/4 (SUCTIONS) ×2 IMPLANT
WATER STERILE IRR 1000ML POUR (IV SOLUTION) ×2 IMPLANT
YANKAUER SUCT BULB TIP NO VENT (SUCTIONS) ×2 IMPLANT

## 2018-08-18 NOTE — Anesthesia Postprocedure Evaluation (Signed)
Anesthesia Post Note  Patient: Ashley Price  Procedure(s) Performed: AMPUTATION LEFT 2ND TOE (Left Foot)     Patient location during evaluation: PACU Anesthesia Type: General Level of consciousness: awake and alert, oriented and patient cooperative Pain management: pain level controlled Vital Signs Assessment: post-procedure vital signs reviewed and stable Respiratory status: spontaneous breathing, nonlabored ventilation and respiratory function stable Cardiovascular status: blood pressure returned to baseline and stable Postop Assessment: no apparent nausea or vomiting Anesthetic complications: no    Last Vitals:  Vitals:   08/18/18 0457 08/18/18 1000  BP: 122/64   Pulse: (!) 59   Resp: 18   Temp: 36.8 C (!) 36.4 C  SpO2: 100%     Last Pain:  Vitals:   08/18/18 1000  TempSrc:   PainSc: Asleep                 Jovoni Borkenhagen,E. Vonnetta Akey

## 2018-08-18 NOTE — Progress Notes (Signed)
   PATIENT ID: Ashley Price   Day of Surgery Procedure(s) (LRB): AMPUTATION LEFT 2ND TOE (Left)  Subjective: Eating breakfast.  Pain improving.  Drain emptied this morning 20cc.  Objective:  Vitals:   08/17/18 2115 08/18/18 0457  BP: (!) 151/86 122/64  Pulse: 81 (!) 59  Resp: 18 18  Temp: 97.7 F (36.5 C) 98.2 F (36.8 C)  SpO2: 97% 100%     L shoulder dressing C/d/i. NVID.  Labs:  Recent Labs    08/17/18 1147 08/18/18 0415  HGB 15.5* 14.1   Recent Labs    08/17/18 1147 08/18/18 0415  WBC 15.7* 12.6*  RBC 5.30* 4.79  HCT 47.3* 41.8  PLT 273 292   Recent Labs    08/17/18 1147 08/18/18 0415  NA 139 139  K 4.4 3.6  CL 108 106  CO2 20* 22  BUN 9 14  CREATININE 0.91 0.91  GLUCOSE 106* 113*  CALCIUM 9.6 9.6    Assessment and Plan: POD #2 s/p I&D, cement spacer L shoulder  Cont abx per ID, await cx/path.  I called lab and added fungal and AFB this am Will like pull drain tomorrow.

## 2018-08-18 NOTE — Op Note (Signed)
Procedure(s): AMPUTATION LEFT 2ND TOE Procedure Note  PANSY OSTROVSKY female 63 y.o. 08/18/2018  Preoperative diagnosis: Left second toe septic joint, osteomyelitis  Postoperative diagnosis: Same  Procedure(s) and Anesthesia Type:    * AMPUTATION LEFT 2ND TOE - General  Surgeon(s) and Role:    Tania Ade, MD - Primary   Indications:  63 y.o. female with several weeks of left second toe swelling and erythema.  MRI revealed extensive infection involving the distal interphalangeal joint with joint destruction and osteomyelitis.  Indicated for amputation of the toe to eliminate the infection and prevent spread.  Surgeon: Isabella Stalling   Assistants: Jeanmarie Hubert PA-C [facility] was present and scrubbed throughout the procedure and was essential in positioning, retraction, exposure, and closure)  Anesthesia: General LMA anesthesia with preoperative regional block given by the attending anesthesiologist     Procedure Detail  AMPUTATION LEFT 2ND TOE  Findings: The toe was excised to the metatarsal head.  Flaps were closed without difficulty.  Estimated Blood Loss:  Minimal         Drains: none  Blood Given: none         Specimens: none        Complications:  * No complications entered in OR log *         Disposition: PACU - hemodynamically stable.         Condition: stable    Procedure:  The patient was identified in the preoperative  holding area where I personally marked the operative site after  verifying site side and procedure with the patient. The patient was taken back  to the operating room where general anesthesia was induced without  Complication. The patient was kept in supine position. A nonsterile tourniquet was applied to the calf. The left lower extremity was prepped and draped in standard sterile fashion.  The tourniquet was elevated to 300 mmHg after elevating the limb for 1 minute. After the appropriate timeout procedure an incision  was made with dorsal and plantar flaps at the base of the second toe.  Sharp dissection was made down to the level of the bone.  Dissection was carried back to the metatarsal phalangeal joint and the joint was released.  The toe was removed without difficulty and sent for microbiology and pathology. The tourniquet was then let down and any bleeding sources were identified and coagulated.  Total tourniquet time was 12 minutes at 300 mmHg. The wound was then closed with inverted mattress sutures using 3-0 nylon. A light sterile dressing was then applied. The patient was placed in a postop shoe, allowed to awaken from anesthesia transferred to the stretcher and taken to the recovery room in stable condition.  Postoperative plan: She will be observed today and could likely be discharged home this afternoon on a short course of oral antibiotics.  We will follow-up in about 10 to 14 days for suture removal and wound check.

## 2018-08-18 NOTE — Discharge Summary (Signed)
Physician Discharge Summary  TARREN SABREE KAJ:[id number] DOB: 08-02-1955 DOA: 08/17/2018  PCP: Donald Prose, MD  Admit date: 08/17/2018 Discharge date: 08/18/2018  Admitted From: Home Disposition: Home  Recommendations for Outpatient Follow-up:  1. Follow up with PCP in 1-2 weeks 2. Follow-up with orthopedic surgery as scheduled. 3. Keep dressing intact and dry for about 5 days, then remove and keep it clean.  Home Health: Not applicable Equipment/Devices: Not applicable  Discharge Condition: Stable CODE STATUS: Full code Diet recommendation: Low-salt diet  Discharge summary: 63 year old female with history of hypertension, no history of diabetes or neuropathy came to the emergency room with 2 weeks of swelling of the left second toe.  She does have history of nail removal on the same toe.  Patient was found to have osteomyelitis of the toe.  Admitted to the hospital.  Underwent ray amputation at the metatarsal head with primary closure of the wound by orthopedic surgery under local anesthesia.  All infected tissue removed to the healthy margin.  Surgery prescribed 5 days of Keflex.  Patient is stable post procedure and going home.  Dressing instructions given.  Discharge Diagnoses:  Principal Problem:   Osteomyelitis ([place]) Active Problems:   Cellulitis   Hypertensive urgency   Osteoarthritis    Discharge Instructions  Discharge Instructions    Diet - low sodium heart healthy   Complete by: As directed    Increase activity slowly   Complete by: As directed    Leave dressing on - Keep it clean, dry, and intact until clinic visit   Complete by: As directed    No dressing needed   Complete by: As directed      Allergies as of 08/18/2018   No Known Allergies     Medication List    TAKE these medications   cephALEXin 500 MG capsule Commonly known as: KEFLEX Take 1 capsule (500 mg total) by mouth 4 (four) times daily for 5 days.   diclofenac sodium 1 % Gel Commonly  known as: VOLTAREN Apply 2 g topically 4 (four) times daily.   ibuprofen 800 MG tablet Commonly known as: ADVIL Take 1 tablet (800 mg total) by mouth 3 (three) times daily.   lisinopril-hydrochlorothiazide 20-12.5 MG tablet Commonly known as: ZESTORETIC Take 1 tablet by mouth daily.   nabumetone 500 MG tablet Commonly known as: RELAFEN Take 500 mg by mouth 2 (two) times daily.      Follow-up Information    Grier Mitts, PA-C. Schedule an appointment as soon as possible for a visit on 08/30/2018.   Specialty: Orthopedic Surgery Contact information: [address] [place] [address] [phone]          No Known Allergies  Consultations:  Orthopedics   Procedures/Studies: Mr Foot Left W Wo Contrast  Result Date: 08/17/2018 CLINICAL DATA:  Second toe swelling EXAM: MRI OF THE LEFT FOREFOOT WITHOUT AND WITH CONTRAST TECHNIQUE: Multiplanar, multisequence MR imaging of the left forefoot was performed both before and after administration of intravenous contrast. CONTRAST:  8 cc Gadavist COMPARISON:  Radiographs from August 17, 2018 FINDINGS: Bones/Joint/Cartilage Second toe distal interphalangeal joint severe arthropathy with erosions and destructive findings of the middle and distal phalanges. Appearance favors distal interphalangeal joint septic arthritis with osteomyelitis of the adjacent phalanges. There is associated considerable enhancement and edema both in the osseous structures and within along the joint. The proximal phalanx appears unremarkable as do the remaining toes. Ligaments Lisfranc ligament intact. Muscles and Tendons Unremarkable Soft tissues Extensive  subcutaneous edema and enhancement in the second toe favoring cellulitis. Edema tracks proximally in the dorsum of the foot but without the same degree of enhancement as in the subcutaneous tissues of the toe. IMPRESSION: 1. Bony destructive findings along the distal interphalangeal joint of the  second toe with prominent enhancement in the joint, in the adjacent middle and distal phalanges, and in the surrounding subcutaneous tissues. Given the lack of similar findings in other joints of the forefoot, the appearance is felt to favor septic arthritis of the second toe distal interphalangeal joint with associated osteomyelitis and cellulitis. Electronically Signed   By: Van Clines M.D.   On: 08/17/2018 17:08   Dg Toe 2nd Left  Result Date: 08/17/2018 CLINICAL DATA:  LEFT second toe pain and swelling for 2 weeks. No specific injury. EXAM: LEFT SECOND TOE COMPARISON:  None. FINDINGS: Prominent erosions and demineralization involving the osseous structures on either side of the second DIP joint. No soft tissue gas seen within the overlying soft tissues. IMPRESSION: Prominent erosions and demineralization involving the osseous structures on either side of the second DIP joint. This could represent an inflammatory arthritis or osteomyelitis. Electronically Signed   By: Franki Cabot M.D.   On: 08/17/2018 10:44      Subjective: Patient was seen and examined on the day of discharge.  Came back from to amputation.  She was sitting in couch and eating lunch.  Eager to go home.   Discharge Exam: Vitals:   08/18/18 1030 08/18/18 1100  BP: 137/67 (!) 172/82  Pulse: (!) 52 (!) 51  Resp: 14 16  Temp: (!) 97.5 F (36.4 C) (!) 97.5 F (36.4 C)  SpO2: 100% 100%   Vitals:   08/18/18 1000 08/18/18 1015 08/18/18 1030 08/18/18 1100  BP:  123/83 137/67 (!) 172/82  Pulse:  (!) 58 (!) 52 (!) 51  Resp:  18 14 16   Temp: (!) 97.5 F (36.4 C)  (!) 97.5 F (36.4 C) (!) 97.5 F (36.4 C)  TempSrc:      SpO2:  99% 100% 100%  Weight:      Height:        General: Pt is alert, awake, not in acute distress Cardiovascular: RRR, S1/S2 +, no rubs, no gallops Respiratory: CTA bilaterally, no wheezing, no rhonchi Abdominal: Soft, NT, ND, bowel sounds + Extremities: no edema, no cyanosis, left second  toe with immediate postsurgical dressing that is intact and dry.    The results of significant diagnostics from this hospitalization (including imaging, microbiology, ancillary and laboratory) are listed below for reference.     Microbiology: Recent Results (from the past 240 hour(s))  Urine culture     Status: None   Collection Time: 08/17/18 12:20 PM   Specimen: In/Out Cath Urine  Result Value Ref Range Status   Specimen Description IN/OUT CATH URINE  Final   Special Requests NONE  Final   Culture   Final    NO GROWTH Performed at [facility], [address] [place], [place] [address]    Report Status 08/18/2018 FINAL  Final  SARS Coronavirus 2 (CEPHEID - Performed in [place] hospital lab), Hosp Order     Status: None   Collection Time: 08/17/18 12:41 PM   Specimen: Nasopharyngeal Swab  Result Value Ref Range Status   SARS Coronavirus 2 NEGATIVE NEGATIVE Final    Comment: (NOTE) If result is NEGATIVE SARS-CoV-2 target nucleic acids are NOT DETECTED. The SARS-CoV-2 RNA is generally detectable in upper  and lower  respiratory specimens during the acute phase of infection. The lowest  concentration of SARS-CoV-2 viral copies this assay can detect is 250  copies / mL. A negative result does not preclude SARS-CoV-2 infection  and should not be used as the sole basis for treatment or other  patient management decisions.  A negative result may occur with  improper specimen collection / handling, submission of specimen other  than nasopharyngeal swab, presence of viral mutation(s) within the  areas targeted by this assay, and inadequate number of viral copies  (<250 copies / mL). A negative result must be combined with clinical  observations, patient history, and epidemiological information. If result is POSITIVE SARS-CoV-2 target nucleic acids are DETECTED. The SARS-CoV-2 RNA is generally detectable in upper and lower  respiratory specimens dur ing the acute phase of  infection.  Positive  results are indicative of active infection with SARS-CoV-2.  Clinical  correlation with patient history and other diagnostic information is  necessary to determine patient infection status.  Positive results do  not rule out bacterial infection or co-infection with other viruses. If result is PRESUMPTIVE POSTIVE SARS-CoV-2 nucleic acids MAY BE PRESENT.   A presumptive positive result was obtained on the submitted specimen  and confirmed on repeat testing.  While 2019 novel coronavirus  (SARS-CoV-2) nucleic acids may be present in the submitted sample  additional confirmatory testing may be necessary for epidemiological  and / or clinical management purposes  to differentiate between  SARS-CoV-2 and other Sarbecovirus currently known to infect humans.  If clinically indicated additional testing with an alternate test  methodology [phone]) is advised. The SARS-CoV-2 RNA is generally  detectable in upper and lower respiratory sp ecimens during the acute  phase of infection. The expected result is Negative. Fact Sheet for Patients:  StrictlyIdeas.no Fact Sheet for Healthcare Providers: BankingDealers.co.za This test is not yet approved or cleared by the [place] FDA and has been authorized for detection and/or diagnosis of SARS-CoV-2 by FDA under an Emergency Use Authorization (EUA).  This EUA will remain in effect (meaning this test can be used) for the duration of the COVID-19 declaration under Section 564(b)(1) of the Act, 21 U.S.C. section 360bbb-3(b)(1), unless the authorization is terminated or revoked sooner. Performed at [facility], [place] [address], [place], [place] [address]   Blood Culture (routine x 2)     Status: None (Preliminary result)   Collection Time: 08/17/18  3:04 PM   Specimen: BLOOD  Result Value Ref Range Status   Specimen Description BLOOD LEFT ANTECUBITAL  Final   Special  Requests   Final    AEROBIC BOTTLE ONLY Blood Culture results may not be optimal due to an inadequate volume of blood received in culture bottles   Culture   Final    NO GROWTH < 24 HOURS Performed at [facility], [place] [address], [place], [place] [address]    Report Status PENDING  Incomplete  Blood Culture (routine x 2)     Status: None (Preliminary result)   Collection Time: 08/17/18  3:12 PM   Specimen: BLOOD RIGHT FOREARM  Result Value Ref Range Status   Specimen Description BLOOD RIGHT FOREARM  Final   Special Requests   Final    BOTTLES DRAWN AEROBIC AND ANAEROBIC Blood Culture results may not be optimal due to an inadequate volume of blood received in culture bottles   Culture   Final    NO GROWTH < 24 HOURS Performed  at [facility], [place] [address] [place], [place] [address]    Report Status PENDING  Incomplete  Surgical PCR screen     Status: None   Collection Time: 08/18/18 12:17 AM   Specimen: Nasal Mucosa; Nasal Swab  Result Value Ref Range Status   MRSA, PCR NEGATIVE NEGATIVE Final   Staphylococcus aureus NEGATIVE NEGATIVE Final    Comment: (NOTE) The Xpert SA Assay (FDA approved for NASAL specimens in patients 4 years of age and older), is one component of a comprehensive surveillance program. It is not intended to diagnose infection nor to guide or monitor treatment. Performed at [facility], [place] [address], [place], [place] [address]      Labs: BNP (last 3 results) No results for input(s): BNP in the last 8760 hours. Basic Metabolic Panel: Recent Labs  Lab 08/17/18 1147 08/18/18 0415  NA 139 139  K 4.4 3.6  CL 108 106  CO2 20* 22  GLUCOSE 106* 113*  BUN 9 14  CREATININE 0.91 0.91  CALCIUM 9.6 9.6   Liver Function Tests: Recent Labs  Lab 08/17/18 1312  AST 14*  ALT 10  ALKPHOS 73  BILITOT 0.4  PROT 7.6  ALBUMIN 3.8   No results for input(s): LIPASE, AMYLASE in the last 168 hours. No results for input(s): AMMONIA in  the last 168 hours. CBC: Recent Labs  Lab 08/17/18 1147 08/18/18 0415  WBC 15.7* 12.6*  NEUTROABS 10.3*  --   HGB 15.5* 14.1  HCT 47.3* 41.8  MCV 89.2 87.3  PLT 273 292   Cardiac Enzymes: No results for input(s): CKTOTAL, CKMB, CKMBINDEX, TROPONINI in the last 168 hours. BNP: Invalid input(s): POCBNP CBG: No results for input(s): GLUCAP in the last 168 hours. D-Dimer No results for input(s): DDIMER in the last 72 hours. Hgb A1c No results for input(s): HGBA1C in the last 72 hours. Lipid Profile No results for input(s): CHOL, HDL, LDLCALC, TRIG, CHOLHDL, LDLDIRECT in the last 72 hours. Thyroid function studies No results for input(s): TSH, T4TOTAL, T3FREE, THYROIDAB in the last 72 hours.  Invalid input(s): FREET3 Anemia work up No results for input(s): VITAMINB12, FOLATE, FERRITIN, TIBC, IRON, RETICCTPCT in the last 72 hours. Urinalysis    Component Value Date/Time   COLORURINE STRAW (A) 08/17/2018 1234   APPEARANCEUR CLEAR 08/17/2018 1234   LABSPEC 1.004 (L) 08/17/2018 1234   PHURINE 6.0 08/17/2018 1234   GLUCOSEU NEGATIVE 08/17/2018 1234   HGBUR NEGATIVE 08/17/2018 1234   BILIRUBINUR NEGATIVE 08/17/2018 [place] 08/17/2018 1234   PROTEINUR NEGATIVE 08/17/2018 1234   NITRITE NEGATIVE 08/17/2018 1234   LEUKOCYTESUR TRACE (A) 08/17/2018 1234   Sepsis Labs Invalid input(s): PROCALCITONIN,  WBC,  LACTICIDVEN Microbiology Recent Results (from the past 240 hour(s))  Urine culture     Status: None   Collection Time: 08/17/18 12:20 PM   Specimen: In/Out Cath Urine  Result Value Ref Range Status   Specimen Description IN/OUT CATH URINE  Final   Special Requests NONE  Final   Culture   Final    NO GROWTH Performed at [facility], [place] [address], [place], [place] [address]    Report Status 08/18/2018 FINAL  Final  SARS Coronavirus 2 (CEPHEID - Performed in [place] hospital lab), Hosp Order     Status: None   Collection Time: 08/17/18  12:41 PM   Specimen: Nasopharyngeal Swab  Result Value Ref Range Status   SARS Coronavirus 2 NEGATIVE NEGATIVE Final  Comment: (NOTE) If result is NEGATIVE SARS-CoV-2 target nucleic acids are NOT DETECTED. The SARS-CoV-2 RNA is generally detectable in upper and lower  respiratory specimens during the acute phase of infection. The lowest  concentration of SARS-CoV-2 viral copies this assay can detect is 250  copies / mL. A negative result does not preclude SARS-CoV-2 infection  and should not be used as the sole basis for treatment or other  patient management decisions.  A negative result may occur with  improper specimen collection / handling, submission of specimen other  than nasopharyngeal swab, presence of viral mutation(s) within the  areas targeted by this assay, and inadequate number of viral copies  (<250 copies / mL). A negative result must be combined with clinical  observations, patient history, and epidemiological information. If result is POSITIVE SARS-CoV-2 target nucleic acids are DETECTED. The SARS-CoV-2 RNA is generally detectable in upper and lower  respiratory specimens dur ing the acute phase of infection.  Positive  results are indicative of active infection with SARS-CoV-2.  Clinical  correlation with patient history and other diagnostic information is  necessary to determine patient infection status.  Positive results do  not rule out bacterial infection or co-infection with other viruses. If result is PRESUMPTIVE POSTIVE SARS-CoV-2 nucleic acids MAY BE PRESENT.   A presumptive positive result was obtained on the submitted specimen  and confirmed on repeat testing.  While 2019 novel coronavirus  (SARS-CoV-2) nucleic acids may be present in the submitted sample  additional confirmatory testing may be necessary for epidemiological  and / or clinical management purposes  to differentiate between  SARS-CoV-2 and other Sarbecovirus currently known to infect  humans.  If clinically indicated additional testing with an alternate test  methodology [phone]) is advised. The SARS-CoV-2 RNA is generally  detectable in upper and lower respiratory sp ecimens during the acute  phase of infection. The expected result is Negative. Fact Sheet for Patients:  StrictlyIdeas.no Fact Sheet for Healthcare Providers: BankingDealers.co.za This test is not yet approved or cleared by the [place] FDA and has been authorized for detection and/or diagnosis of SARS-CoV-2 by FDA under an Emergency Use Authorization (EUA).  This EUA will remain in effect (meaning this test can be used) for the duration of the COVID-19 declaration under Section 564(b)(1) of the Act, 21 U.S.C. section 360bbb-3(b)(1), unless the authorization is terminated or revoked sooner. Performed at [facility], [place] [address], [place], [place] [address]   Blood Culture (routine x 2)     Status: None (Preliminary result)   Collection Time: 08/17/18  3:04 PM   Specimen: BLOOD  Result Value Ref Range Status   Specimen Description BLOOD LEFT ANTECUBITAL  Final   Special Requests   Final    AEROBIC BOTTLE ONLY Blood Culture results may not be optimal due to an inadequate volume of blood received in culture bottles   Culture   Final    NO GROWTH < 24 HOURS Performed at [facility], [place] [address] [place], [place] [address]    Report Status PENDING  Incomplete  Blood Culture (routine x 2)     Status: None (Preliminary result)   Collection Time: 08/17/18  3:12 PM   Specimen: BLOOD RIGHT FOREARM  Result Value Ref Range Status   Specimen Description BLOOD RIGHT FOREARM  Final   Special Requests   Final    BOTTLES DRAWN AEROBIC AND ANAEROBIC Blood Culture results may not be optimal due to an inadequate volume of  blood received in culture bottles   Culture   Final    NO GROWTH < 24 HOURS Performed at [facility], [place]  [address] [place], [place] [address]    Report Status PENDING  Incomplete  Surgical PCR screen     Status: None   Collection Time: 08/18/18 12:17 AM   Specimen: Nasal Mucosa; Nasal Swab  Result Value Ref Range Status   MRSA, PCR NEGATIVE NEGATIVE Final   Staphylococcus aureus NEGATIVE NEGATIVE Final    Comment: (NOTE) The Xpert SA Assay (FDA approved for NASAL specimens in patients 92 years of age and older), is one component of a comprehensive surveillance program. It is not intended to diagnose infection nor to guide or monitor treatment. Performed at [facility], [place] [address] [place], [place] [address]      Time coordinating discharge: 25 minutes  SIGNED:   Barb Merino, MD  [facility] 08/18/2018, 12:21 PM

## 2018-08-18 NOTE — Plan of Care (Signed)
  Problem: Education: Goal: Knowledge of General Education information will improve Description: Including pain rating scale, medication(s)/side effects and non-pharmacologic comfort measures Outcome: Progressing   Problem: Health Behavior/Discharge Planning: Goal: Ability to manage health-related needs will improve Outcome: Progressing   Problem: Clinical Measurements: Goal: Ability to maintain clinical measurements within normal limits will improve Outcome: Progressing Goal: Will remain free from infection Outcome: Progressing Goal: Diagnostic test results will improve Outcome: Progressing   Problem: Nutrition: Goal: Adequate nutrition will be maintained Outcome: Progressing   Problem: Coping: Goal: Level of anxiety will decrease Outcome: Progressing   Problem: Elimination: Goal: Will not experience complications related to urinary retention Outcome: Progressing   Problem: Pain Managment: Goal: General experience of comfort will improve Outcome: Progressing   Problem: Safety: Goal: Ability to remain free from injury will improve Outcome: Progressing   Problem: Skin Integrity: Goal: Risk for impaired skin integrity will decrease Outcome: Progressing   Problem: Clinical Measurements: Goal: Respiratory complications will improve Outcome: Not Applicable Goal: Cardiovascular complication will be avoided Outcome: Not Applicable   Problem: Activity: Goal: Risk for activity intolerance will decrease Outcome: Not Applicable   Problem: Elimination: Goal: Will not experience complications related to bowel motility Outcome: Not Applicable

## 2018-08-18 NOTE — Anesthesia Procedure Notes (Signed)
Anesthesia Regional Block: Ankle block   Pre-Anesthetic Checklist: ,, timeout performed, Correct Patient, Correct Site, Correct Laterality, Correct Procedure, Correct Position, site marked, Risks and benefits discussed,  Surgical consent,  Pre-op evaluation,  At surgeon's request and post-op pain management  Laterality: Left and Lower  Prep: chloraprep       Needles:   Needle Type: Quincke     Needle Length: 4cm  Needle Gauge: 25     Additional Needles:   (perineural infiltration)  Narrative:  Start time: 08/18/2018 8:50 AM End time: 08/18/2018 8:56 AM Injection made incrementally with aspirations every 5 mL.  Performed by: Personally  Anesthesiologist: Annye Asa, MD  Additional Notes: Pt identified in Holding room.  Monitors applied. Working IV access confirmed. Sterile prep l ankle.  #25ga Quincke infiltration of local around deep and sup peroneal, saph, sural, post tib nerves.  Total 40cc 0.5% Bupivacaine injected incrementally.  Patient asymptomatic, VSS, no heme aspirated, tolerated well.  Jenita Seashore, MD

## 2018-08-18 NOTE — Discharge Instructions (Signed)
Discharge Instructions after Knee Arthroscopy   You will have a light dressing on your foot.  Leave the dressing in place until the fifth day after your surgery and then remove it and place a band-aid over the stitches.  After the bandage has been removed you may shower, but do not soak the incision. You may begin gentle motion of your leg immediately after surgery. Apply ice to the foot 3 times per day for 30 minutes for the first 1 week until your foot is feeling comfortable again. Do not use heat. Elevate frequently. Pain medicine has been prescribed for you.  Use your medicine as needed over the first 48 hours, and then you can begin to taper your use. You may take Extra Strength Tylenol or Tylenol only in place of the pain pills.  Take one 81mg  aspirin daily for 3 weeks post-operatively unless it upsets your stomach.   Please call [phone] during normal business hours or [phone] after hours for any problems. Including the following:  - excessive redness of the incisions - drainage for more than 4 days - fever of more than 101.5 F  *Please note that pain medications will not be refilled after hours or on weekends.

## 2018-08-18 NOTE — Progress Notes (Signed)
Orthopedic Tech Progress Note Patient Details:  Ashley Price November 22, 1955 [id number] OR RN called requesting a Post Op Shoe and I dropped it off to the front desk Ortho Devices Type of Ortho Device: Postop shoe/boot Ortho Device/Splint Interventions: Other (comment)   Post Interventions Patient Tolerated: Other (comment) Instructions Provided: Other (comment)   [name] 08/18/2018, 9:50 AM

## 2018-08-18 NOTE — Transfer of Care (Signed)
Immediate Anesthesia Transfer of Care Note  Patient: ARFA LAMARCA  Procedure(s) Performed: AMPUTATION LEFT 2ND TOE (Left Foot)  Patient Location: PACU  Anesthesia Type:General  Level of Consciousness: drowsy  Airway & Oxygen Therapy: Patient Spontanous Breathing and Patient connected to face mask oxygen  Post-op Assessment: Report given to RN and Post -op Vital signs reviewed and stable  Post vital signs: Reviewed and stable  Last Vitals:  Vitals Value Taken Time  BP 110/67 08/18/18 0959  Temp    Pulse 60 08/18/18 1002  Resp 12 08/18/18 1002  SpO2 97 % 08/18/18 1002  Vitals shown include unvalidated device data.  Last Pain:  Vitals:   08/18/18 0806  TempSrc:   PainSc: 0-No pain         Complications: No apparent anesthesia complications

## 2018-08-18 NOTE — Anesthesia Procedure Notes (Signed)
Procedure Name: LMA Insertion Date/Time: 08/18/2018 9:27 AM Performed by: Bryson Corona, CRNA Pre-anesthesia Checklist: Patient identified, Emergency Drugs available, Suction available and Patient being monitored Patient Re-evaluated:Patient Re-evaluated prior to induction Oxygen Delivery Method: Circle System Utilized Preoxygenation: Pre-oxygenation with 100% oxygen Induction Type: IV induction LMA: LMA inserted LMA Size: 4.0 Number of attempts: 1 Placement Confirmation: positive ETCO2 Tube secured with: Tape Dental Injury: Teeth and Oropharynx as per pre-operative assessment

## 2018-08-18 NOTE — Anesthesia Preprocedure Evaluation (Addendum)
Anesthesia Evaluation   Patient awake    Reviewed: Allergy & Precautions, NPO status , Patient's Chart, lab work & pertinent test results  History of Anesthesia Complications Negative for: history of anesthetic complications  Airway Mallampati: I  TM Distance: >3 FB Neck ROM: Full    Dental  (+) Edentulous Upper, Edentulous Lower   Pulmonary Current Smoker,  08/17/2018 SARS coronavirus NEG   breath sounds clear to auscultation       Cardiovascular hypertension, Pt. on medications + angina  Rhythm:Regular Rate:Normal     Neuro/Psych negative neurological ROS     GI/Hepatic negative GI ROS, Neg liver ROS,   Endo/Other  obese  Renal/GU negative Renal ROS     Musculoskeletal  (+) Arthritis , Osteoarthritis,    Abdominal (+) + obese,   Peds  Hematology negative hematology ROS (+)   Anesthesia Other Findings   Reproductive/Obstetrics                            Anesthesia Physical Anesthesia Plan  ASA: II  Anesthesia Plan: Regional   Post-op Pain Management:    Induction:   PONV Risk Score and Plan: 1 and Ondansetron  Airway Management Planned: Natural Airway and Simple Face Mask  Additional Equipment:   Intra-op Plan:   Post-operative Plan:   Informed Consent: I have reviewed the patients History and Physical, chart, labs and discussed the procedure including the risks, benefits and alternatives for the proposed anesthesia with the patient or authorized representative who has indicated his/her understanding and acceptance.       Plan Discussed with: CRNA and Surgeon  Anesthesia Plan Comments: (Plan routine monitors, Ankle block with sedation)       Anesthesia Quick Evaluation

## 2018-08-18 NOTE — Interval H&P Note (Signed)
History and Physical Interval Note:  08/18/2018 8:42 AM  Ashley Price  has presented today for surgery, with the diagnosis of left 2nd toe osteomyelitis.  The various methods of treatment have been discussed with the patient and family. After consideration of risks, benefits and other options for treatment, the patient has consented to  Procedure(s): AMPUTATION LEFT 2ND TOE (Left) as a surgical intervention.  The patient's history has been reviewed, patient examined, no change in status, stable for surgery.  I have reviewed the patient's chart and labs.  Questions were answered to the patient's satisfaction.     Isabella Stalling

## 2018-08-18 NOTE — Progress Notes (Signed)
Ander Purpura to be discharged Home per MD order. Discussed prescriptions and follow up appointments with the patient. Prescriptions given to patient; medication list explained in detail. Patient verbalized understanding.   Skin clean, dry and intact without evidence of skin break down, no evidence of skin tears noted. IV catheter discontinued intact. Site without signs and symptoms of complications. Dressing and pressure applied. Pt denies pain at the site currently. No complaints noted. Toe amputation wrapped with coban, clean, dry and intact. Patient free of lines, drains, and wounds.  An After Visit Summary (AVS) was printed and given to the patient. Patient escorted via wheelchair, and discharged home via private auto.  Cathlean Marseilles Tamala Julian, MSN, RN, [place], AGCNS

## 2018-08-19 ENCOUNTER — Encounter (HOSPITAL_COMMUNITY): Payer: Self-pay | Admitting: Orthopedic Surgery

## 2018-08-22 LAB — CULTURE, BLOOD (ROUTINE X 2)
Culture: NO GROWTH
Culture: NO GROWTH

## 2018-08-24 LAB — AEROBIC/ANAEROBIC CULTURE W GRAM STAIN (SURGICAL/DEEP WOUND)

## 2018-08-26 DIAGNOSIS — Z89422 Acquired absence of other left toe(s): Secondary | ICD-10-CM | POA: Diagnosis not present

## 2018-08-26 DIAGNOSIS — R69 Illness, unspecified: Secondary | ICD-10-CM | POA: Diagnosis not present

## 2018-08-26 DIAGNOSIS — M869 Osteomyelitis, unspecified: Secondary | ICD-10-CM | POA: Diagnosis not present

## 2018-08-26 DIAGNOSIS — I10 Essential (primary) hypertension: Secondary | ICD-10-CM | POA: Diagnosis not present

## 2018-08-28 DIAGNOSIS — M79675 Pain in left toe(s): Secondary | ICD-10-CM | POA: Diagnosis not present

## 2018-09-18 DIAGNOSIS — M79675 Pain in left toe(s): Secondary | ICD-10-CM | POA: Diagnosis not present

## 2018-12-20 ENCOUNTER — Emergency Department (HOSPITAL_COMMUNITY): Payer: Medicare HMO

## 2018-12-20 ENCOUNTER — Encounter (HOSPITAL_COMMUNITY): Payer: Self-pay | Admitting: Internal Medicine

## 2018-12-20 ENCOUNTER — Inpatient Hospital Stay (HOSPITAL_COMMUNITY)
Admission: EM | Admit: 2018-12-20 | Discharge: 2018-12-23 | DRG: 066 | Disposition: A | Discharge status: Home Health | Payer: Medicare HMO | Attending: Internal Medicine | Admitting: Internal Medicine

## 2018-12-20 ENCOUNTER — Inpatient Hospital Stay (HOSPITAL_COMMUNITY): Payer: Medicare HMO

## 2018-12-20 ENCOUNTER — Other Ambulatory Visit: Payer: Self-pay

## 2018-12-20 DIAGNOSIS — M1990 Unspecified osteoarthritis, unspecified site: Secondary | ICD-10-CM | POA: Diagnosis not present

## 2018-12-20 DIAGNOSIS — D72829 Elevated white blood cell count, unspecified: Secondary | ICD-10-CM | POA: Diagnosis present

## 2018-12-20 DIAGNOSIS — Z9851 Tubal ligation status: Secondary | ICD-10-CM | POA: Diagnosis not present

## 2018-12-20 DIAGNOSIS — E1169 Type 2 diabetes mellitus with other specified complication: Secondary | ICD-10-CM

## 2018-12-20 DIAGNOSIS — Z89422 Acquired absence of other left toe(s): Secondary | ICD-10-CM | POA: Diagnosis not present

## 2018-12-20 DIAGNOSIS — Z791 Long term (current) use of non-steroidal anti-inflammatories (NSAID): Secondary | ICD-10-CM

## 2018-12-20 DIAGNOSIS — Z9181 History of falling: Secondary | ICD-10-CM | POA: Diagnosis not present

## 2018-12-20 DIAGNOSIS — I639 Cerebral infarction, unspecified: Secondary | ICD-10-CM | POA: Diagnosis present

## 2018-12-20 DIAGNOSIS — E1165 Type 2 diabetes mellitus with hyperglycemia: Secondary | ICD-10-CM | POA: Diagnosis present

## 2018-12-20 DIAGNOSIS — M170 Bilateral primary osteoarthritis of knee: Secondary | ICD-10-CM | POA: Diagnosis present

## 2018-12-20 DIAGNOSIS — I63011 Cerebral infarction due to thrombosis of right vertebral artery: Secondary | ICD-10-CM | POA: Insufficient documentation

## 2018-12-20 DIAGNOSIS — E785 Hyperlipidemia, unspecified: Secondary | ICD-10-CM | POA: Diagnosis not present

## 2018-12-20 DIAGNOSIS — Z6831 Body mass index (BMI) 31.0-31.9, adult: Secondary | ICD-10-CM | POA: Diagnosis not present

## 2018-12-20 DIAGNOSIS — R69 Illness, unspecified: Secondary | ICD-10-CM | POA: Diagnosis not present

## 2018-12-20 DIAGNOSIS — Z79899 Other long term (current) drug therapy: Secondary | ICD-10-CM | POA: Diagnosis not present

## 2018-12-20 DIAGNOSIS — Z8673 Personal history of transient ischemic attack (TIA), and cerebral infarction without residual deficits: Secondary | ICD-10-CM | POA: Diagnosis not present

## 2018-12-20 DIAGNOSIS — F17210 Nicotine dependence, cigarettes, uncomplicated: Secondary | ICD-10-CM | POA: Diagnosis present

## 2018-12-20 DIAGNOSIS — I10 Essential (primary) hypertension: Secondary | ICD-10-CM | POA: Diagnosis not present

## 2018-12-20 DIAGNOSIS — I63111 Cerebral infarction due to embolism of right vertebral artery: Secondary | ICD-10-CM | POA: Diagnosis not present

## 2018-12-20 DIAGNOSIS — I6350 Cerebral infarction due to unspecified occlusion or stenosis of unspecified cerebral artery: Secondary | ICD-10-CM

## 2018-12-20 DIAGNOSIS — R42 Dizziness and giddiness: Secondary | ICD-10-CM | POA: Diagnosis not present

## 2018-12-20 DIAGNOSIS — I6381 Other cerebral infarction due to occlusion or stenosis of small artery: Secondary | ICD-10-CM | POA: Diagnosis not present

## 2018-12-20 DIAGNOSIS — R112 Nausea with vomiting, unspecified: Secondary | ICD-10-CM | POA: Diagnosis not present

## 2018-12-20 DIAGNOSIS — Y92009 Unspecified place in unspecified non-institutional (private) residence as the place of occurrence of the external cause: Secondary | ICD-10-CM

## 2018-12-20 DIAGNOSIS — R29701 NIHSS score 1: Secondary | ICD-10-CM | POA: Diagnosis present

## 2018-12-20 DIAGNOSIS — I6782 Cerebral ischemia: Secondary | ICD-10-CM | POA: Diagnosis not present

## 2018-12-20 DIAGNOSIS — E669 Obesity, unspecified: Secondary | ICD-10-CM | POA: Diagnosis not present

## 2018-12-20 DIAGNOSIS — Z03818 Encounter for observation for suspected exposure to other biological agents ruled out: Secondary | ICD-10-CM | POA: Diagnosis not present

## 2018-12-20 DIAGNOSIS — Z20828 Contact with and (suspected) exposure to other viral communicable diseases: Secondary | ICD-10-CM | POA: Diagnosis present

## 2018-12-20 DIAGNOSIS — I6389 Other cerebral infarction: Secondary | ICD-10-CM | POA: Diagnosis not present

## 2018-12-20 DIAGNOSIS — W19XXXA Unspecified fall, initial encounter: Secondary | ICD-10-CM

## 2018-12-20 DIAGNOSIS — J3489 Other specified disorders of nose and nasal sinuses: Secondary | ICD-10-CM | POA: Diagnosis not present

## 2018-12-20 LAB — SARS CORONAVIRUS 2 (TAT 6-24 HRS): SARS Coronavirus 2: NEGATIVE

## 2018-12-20 LAB — CBC
HCT: 47.2 % — ABNORMAL HIGH (ref 36.0–46.0)
Hemoglobin: 15.6 g/dL — ABNORMAL HIGH (ref 12.0–15.0)
MCH: 29.1 pg (ref 26.0–34.0)
MCHC: 33.1 g/dL (ref 30.0–36.0)
MCV: 88.1 fL (ref 80.0–100.0)
Platelets: 303 10*3/uL (ref 150–400)
RBC: 5.36 MIL/uL — ABNORMAL HIGH (ref 3.87–5.11)
RDW: 14.1 % (ref 11.5–15.5)
WBC: 16.7 10*3/uL — ABNORMAL HIGH (ref 4.0–10.5)
nRBC: 0 % (ref 0.0–0.2)

## 2018-12-20 LAB — BASIC METABOLIC PANEL
Anion gap: 13 (ref 5–15)
BUN: 8 mg/dL (ref 8–23)
CO2: 27 mmol/L (ref 22–32)
Calcium: 9.8 mg/dL (ref 8.9–10.3)
Chloride: 99 mmol/L (ref 98–111)
Creatinine, Ser: 0.97 mg/dL (ref 0.44–1.00)
GFR calc Af Amer: >60 mL/min (ref >60)
GFR calc non Af Amer: >60 mL/min (ref >60)
Glucose, Bld: 145 mg/dL — ABNORMAL HIGH (ref 70–99)
Potassium: 3.7 mmol/L (ref 3.5–5.1)
Sodium: 139 mmol/L (ref 135–145)

## 2018-12-20 LAB — HEPATIC FUNCTION PANEL
ALT: 10 U/L (ref 0–44)
AST: 15 U/L (ref 15–41)
Albumin: 4.1 g/dL (ref 3.5–5.0)
Alkaline Phosphatase: 80 U/L (ref 38–126)
Bilirubin, Direct: <0.1 mg/dL (ref 0.0–0.2)
Total Bilirubin: 0.6 mg/dL (ref 0.3–1.2)
Total Protein: 8.1 g/dL (ref 6.5–8.1)

## 2018-12-20 LAB — ECHOCARDIOGRAM COMPLETE
Height: 63.5 in
Weight: 2864 oz

## 2018-12-20 LAB — CBG MONITORING, ED: Glucose-Capillary: 151 mg/dL — ABNORMAL HIGH (ref 70–99)

## 2018-12-20 LAB — ETHANOL: Alcohol, Ethyl (B): <10 mg/dL (ref <10)

## 2018-12-20 MED ORDER — DIPHENHYDRAMINE HCL 50 MG/ML IJ SOLN
25.0000 mg | Freq: Once | INTRAMUSCULAR | Status: AC
  Administered 2018-12-20: 14:00:00 25 mg via INTRAVENOUS
  Filled 2018-12-20: qty 1

## 2018-12-20 MED ORDER — PROCHLORPERAZINE EDISYLATE 10 MG/2ML IJ SOLN
10.0000 mg | Freq: Once | INTRAMUSCULAR | Status: AC
  Administered 2018-12-20: 14:00:00 10 mg via INTRAVENOUS
  Filled 2018-12-20: qty 2

## 2018-12-20 MED ORDER — SODIUM CHLORIDE 0.9 % IV SOLN
INTRAVENOUS | Status: DC
  Administered 2018-12-20 – 2018-12-21 (×3): via INTRAVENOUS

## 2018-12-20 MED ORDER — ACETAMINOPHEN 650 MG RE SUPP: 650.0000 mg | RECTAL | Status: DC | PRN

## 2018-12-20 MED ORDER — SODIUM CHLORIDE 0.9 % IV BOLUS
1000.0000 mL | Freq: Once | INTRAVENOUS | Status: AC
  Administered 2018-12-20: 14:00:00 1000 mL via INTRAVENOUS

## 2018-12-20 MED ORDER — SENNOSIDES-DOCUSATE SODIUM 8.6-50 MG PO TABS: 1.0000 | ORAL_TABLET | Freq: Every evening | ORAL | Status: DC | PRN

## 2018-12-20 MED ORDER — ENOXAPARIN SODIUM 40 MG/0.4ML ~~LOC~~ SOLN
40.0000 mg | SUBCUTANEOUS | Status: DC
  Administered 2018-12-20 – 2018-12-22 (×3): 40 mg via SUBCUTANEOUS
  Filled 2018-12-20 (×3): qty 0.4

## 2018-12-20 MED ORDER — NABUMETONE 500 MG PO TABS
500.0000 mg | ORAL_TABLET | Freq: Two times a day (BID) | ORAL | Status: DC
  Administered 2018-12-20 – 2018-12-23 (×6): 500 mg via ORAL
  Filled 2018-12-20 (×6): qty 1

## 2018-12-20 MED ORDER — SODIUM CHLORIDE 0.9% FLUSH
3.0000 mL | Freq: Once | INTRAVENOUS | Status: AC
  Administered 2018-12-20: 14:00:00 3 mL via INTRAVENOUS

## 2018-12-20 MED ORDER — MECLIZINE HCL 25 MG PO TABS
25.0000 mg | ORAL_TABLET | Freq: Once | ORAL | Status: AC
  Administered 2018-12-20: 14:00:00 25 mg via ORAL
  Filled 2018-12-20: qty 1

## 2018-12-20 MED ORDER — ACETAMINOPHEN 325 MG PO TABS: 650.0000 mg | ORAL_TABLET | ORAL | Status: DC | PRN

## 2018-12-20 MED ORDER — ACETAMINOPHEN 160 MG/5ML PO SOLN: 650.0000 mg | ORAL | Status: DC | PRN

## 2018-12-20 MED ORDER — STROKE: EARLY STAGES OF RECOVERY BOOK
Freq: Once | Status: AC
  Administered 2018-12-21: 10:00:00

## 2018-12-20 NOTE — ED Triage Notes (Signed)
Pt reports nausea/vomting since yesterday. Also noted to have dizzy sensation/ off balance and falling to the right side. No droop, drift, slurred speech noted. No fevers. Denies pain. Alert, oriented x4 at present.

## 2018-12-20 NOTE — Progress Notes (Signed)
Pt admitted to the unit from ED; pt alert and verbally responsive; VSS: telemetry applied and verified with CCMD; NT called to second verify. Pt oriented to the unit and room; fall/safety precaution and prevention education completed with pt. Pt in bed with call light within reach and bed alarm on. Delia Heady RN   12/20/18 2010  Vitals  Temp 98 F (36.7 C)  Temp Source Oral  BP (!) 194/82  MAP (mmHg) 112  BP Location Left Arm  BP Method Automatic  Patient Position (if appropriate) Lying  Pulse Rate (!) 57  Pulse Rate Source Monitor  Resp 18  Oxygen Therapy  SpO2 100 %  O2 Device Room Air  MEWS Score  MEWS RR 0  MEWS Pulse 0  MEWS Systolic 0  MEWS LOC 0  MEWS Temp 0  MEWS Score 0  MEWS Score Color Green

## 2018-12-20 NOTE — Consult Note (Addendum)
NEURO HOSPITALIST  CONSULT   Requesting Physician: Dr. Tyrone Nine    Chief Complaint: Dizziness  History obtained from:  Patient     HPI:                                                                                                                                         Ashley Price is an 63 y.o. female with PMHx significant for HTN who presented to [facility] ED with c/o dizziness and emesis.   Per patient when she went to bed last night she had dizziness (described as like "floating on a boat" but non-vertiginious) and she had emesis. She went to bed. When she woke up about 0300 this morning to use the bathroom she still had dizziness, felt unsteady on her feet and she fell. Denies hitting her head or LOC. She stated that she has been unsteady while walking with her walker. She felt that she was leaning more to the right but her left leg felt a little weak as well. She also had a HA yesterday during the day, but did not have one when the dizziness began. Denies chest pain. SOB, vision changes, numbness or tingling, ETOH or drug use. Endorses smoking 1/2 pack of cigarettes per day. She does not take an antiplatelet medication.   ED course:  MRI: acute infarct of the right posterolateral medulla BP: 196/93 BG: 151   Date last known well: 12/19/2018 tPA Given: no; outside of window     Modified Rankin: Rankin Score=2 NIHSS: 1 right arm drift   Past Medical History:  Diagnosis Date  . Hypertension   . Osteoarthritis     Past Surgical History:  Procedure Laterality Date  . AMPUTATION Left 08/18/2018   Procedure: AMPUTATION LEFT 2ND TOE;  Surgeon: Tania Ade, MD;  Location: [place];  Service: Orthopedics;  Laterality: Left;  . TUBAL LIGATION Bilateral     Family History  Problem Relation Age of Onset  . Breast cancer Mother   . Kidney failure Brother       Social History:  reports that she has been smoking cigarettes. She has never  used smokeless tobacco. She reports that she does not drink alcohol or use drugs.  Allergies: No Known Allergies  Medications:  No current facility-administered medications for this encounter.    Current Outpatient Medications  Medication Sig Dispense Refill  . diclofenac sodium (VOLTAREN) 1 % GEL Apply 2 g topically 4 (four) times daily. 1 Tube 0  . ibuprofen (ADVIL,MOTRIN) 800 MG tablet Take 1 tablet (800 mg total) by mouth 3 (three) times daily. 21 tablet 0  . lisinopril-hydrochlorothiazide (PRINZIDE,ZESTORETIC) 20-12.5 MG tablet Take 1 tablet by mouth daily.    . nabumetone (RELAFEN) 500 MG tablet Take 500 mg by mouth 2 (two) times daily.       ROS:                                                                                                                                       ROS was performed and is negative except as noted in HPI    General Examination:                                                                                                      Blood pressure (!) 166/89, pulse 80, temperature 97.7 F (36.5 C), temperature source Oral, resp. rate 16, SpO2 97 %.  Physical Exam  Constitutional: Appears well-developed and well-nourished.  Psych: Affect appropriate to situation Eyes: Normal external eye and conjunctiva. HENT: Normocephalic, no lesions, without obvious abnormality.   Musculoskeletal-no joint tenderness, deformity or swelling Cardiovascular: Normal rate and regular rhythm.  Respiratory: Effort normal, non-labored breathing saturations WNL GI: Soft.  No distension. There is no tenderness.  Skin: WDI  Neurological Examination Mental Status: Alert, oriented, thought content appropriate.  Naming and repetition intact. Speech fluent without evidence of aphasia. No dysarthria noted.  Able to follow commands without difficulty. Cranial  Nerves: II:  Visual fields grossly normal, PERRL with no miosis noted.  III,IV, VI: Palpebral fissures equal with no ptosis appreciated. EOMI with no nystagmus seen.  V,VII: Smile symmetric, facial light touch, cool temp and pinprick sensation equal bilaterally VIII: hearing intact to voice IX,X: Palate rises equally. However, there is decreased gag reflex on the right.  XI: Symmetric shoulder shrug XII: midline tongue extension Motor: Right : Upper extremity   5/5  Left:     Upper extremity   5/5  Lower extremity   5/5   Lower extremity   5/5 Tone and bulk:normal tone throughout; no atrophy noted Sensory: cool, temp, pinprick and light touch intact throughout, bilaterally without asymmetry Deep Tendon Reflexes: 2+ and symmetric biceps and patellae Plantars: Right: downgoing   Left: downgoing Cerebellar:  No ataxia with FNF, RAM or H-S bilaterally  Gait: Deferred due to falls risk concerns   Lab Results: Basic Metabolic Panel: Recent Labs  Lab 12/20/18 1311  NA 139  K 3.7  CL 99  CO2 27  GLUCOSE 145*  BUN 8  CREATININE 0.97  CALCIUM 9.8    CBC: Recent Labs  Lab 12/20/18 1311  WBC 16.7*  HGB 15.6*  HCT 47.2*  MCV 88.1  PLT 303     CBG: Recent Labs  Lab 12/20/18 1321  GLUCAP 151*    Imaging: Mr Brain Wo Contrast  Result Date: 12/20/2018 CLINICAL DATA:  Nausea and vomiting, dizziness EXAM: MRI HEAD WITHOUT CONTRAST TECHNIQUE: Multiplanar, multiecho pulse sequences of the brain and surrounding structures were obtained without intravenous contrast. COMPARISON:  None. FINDINGS: Brain: Small focus of reduced diffusion is present at the right posterolateral aspect of the medulla. There are chronic small vessel infarcts of the centrum semiovale and corona radiata, basal ganglia, thalamus, pons, and cerebellum. Additional patchy and confluent areas of T2 hyperintensity in the supratentorial white matter are nonspecific but probably reflect moderate chronic microvascular  ischemic changes. There is ex vacuo dilatation of the lateral ventricles. There is no intracranial mass, mass effect, or edema. There is no hydrocephalus or extra-axial fluid collection. Vascular: Major vessel flow voids at the skull base are preserved. Skull and upper cervical spine: Normal marrow signal is preserved. Sinuses/Orbits: Paranasal sinuses are aerated. Orbits are unremarkable. Other: Sella is unremarkable.  Mastoid air cells are clear. IMPRESSION: Small acute infarct of the right posterolateral medulla. Moderate chronic microvascular ischemic changes and multiple chronic small vessel infarcts. Electronically Signed   By: Macy Mis M.D.   On: 12/20/2018 13:26    Laurey Morale, MSN, NP-C [facility] [phone] 12/20/2018, 2:15 PM    Assessment: 63 y.o. female with PMHx significant for HTN who presented to the [facility] ED with c/o dizziness, nausea, emesis and gait instability.  1. MRI reveals an acute infarct in the right posterolateral medulla.  2. On exam patient has impaired gag on the right side. No other expected lateral medullary syndrome findings are present.  3. Will need to be admitted for full stroke work-up.  4. Stroke Risk Factors - hypertension and smoking     Recommendations: -- Smoking cessation -- BP goal : Permissive HTN upto 220/110 mmHg (for 24-48 post admission )  -- CTA head and neck  -- Echocardiogram -- Start ASA -- High intensity statin   -- HgbA1c, fasting lipid panel -- PT consult, OT consult, Speech consult -- Telemetry monitoring -- Frequent neuro checks -- Stroke swallow screen  -- Please page stroke NP  Or  PA  Or MD from 8am -4 pm  as this patient from this time will be  followed by the stroke.   You can look them up on [email or web address]  Password [email or web address]   I have seen and examined the patient. I have formulated the assessment and recommendations. My exam findings were observed and documented by Laurey Morale, NP.  Electronically  signed: Dr. Kerney Elbe

## 2018-12-20 NOTE — Progress Notes (Signed)
  Echocardiogram 2D Echocardiogram has been performed.  Ashley Price 12/20/2018, 5:38 PM

## 2018-12-20 NOTE — ED Provider Notes (Signed)
[facility] EMERGENCY DEPARTMENT Provider Note   CSN: [id number] Arrival date & time: 12/20/18  1046     History   Chief Complaint Chief Complaint  Patient presents with   Emesis   Dizziness    HPI Ashley Price is a 63 y.o. female.     63 yo F with a chief complaint of dizziness.  This started yesterday.  It is hard for her to describe but when she stands up she feels like she may pass out.  She has had significant difficulty walking where she feels like she keeps falling to the right side and cannot walk properly.  Has fallen a couple times since then but has not seriously injured herself.  She has been walking with a walker due to bilateral knee pain.  She denies issues with imbalance in the past.  States that the symptoms started yesterday when she had a right-sided headache that went to her right ear and then resolved.  Denies head trauma.  Denies difficulty with speech or swallowing denies one-sided weakness or numbness denies history of stroke in the past.  Head movement does not seem to make this worse.  She denies cough congestion or fever.  Denies decreased oral intake.  The history is provided by the patient.  Emesis Associated symptoms: no arthralgias, no chills, no fever, no headaches and no myalgias   Dizziness Associated symptoms: vomiting   Associated symptoms: no chest pain, no headaches, no nausea, no palpitations and no shortness of breath   Illness Severity:  Moderate Onset quality:  Gradual Duration:  2 days Timing:  Constant Progression:  Worsening Chronicity:  New Associated symptoms: vomiting   Associated symptoms: no chest pain, no congestion, no fever, no headaches, no myalgias, no nausea, no rhinorrhea, no shortness of breath and no wheezing     Past Medical History:  Diagnosis Date   Hypertension    Osteoarthritis     Patient Active Problem List   Diagnosis Date Noted   Stroke due to thrombosis of right vertebral  artery ([place]) 12/20/2018   Essential hypertension 12/20/2018   Fall at home, initial encounter 12/20/2018   Osteomyelitis ([place]) 08/17/2018   Cellulitis 08/17/2018   Hypertensive urgency 08/17/2018   Osteoarthritis 08/17/2018    Past Surgical History:  Procedure Laterality Date   AMPUTATION Left 08/18/2018   Procedure: AMPUTATION LEFT 2ND TOE;  Surgeon: Tania Ade, MD;  Location: [place];  Service: Orthopedics;  Laterality: Left;   TUBAL LIGATION Bilateral      OB History   No obstetric history on file.      Home Medications    Prior to Admission medications   Medication Sig Start Date End Date Taking? Authorizing Provider  diclofenac sodium (VOLTAREN) 1 % GEL Apply 2 g topically 4 (four) times daily. 03/29/17   Tasia Catchings, Amy V, PA-C  ibuprofen (ADVIL,MOTRIN) 800 MG tablet Take 1 tablet (800 mg total) by mouth 3 (three) times daily. 05/03/17   Zigmund Gottron, NP  lisinopril-hydrochlorothiazide (PRINZIDE,ZESTORETIC) 20-12.5 MG tablet Take 1 tablet by mouth daily.    [provider]  nabumetone (RELAFEN) 500 MG tablet Take 500 mg by mouth 2 (two) times daily.    [provider]    Family History Family History  Problem Relation Age of Onset   Breast cancer Mother    Kidney failure Brother     Social History Social History   Tobacco Use   Smoking status: Current Every Day Smoker  Types: Cigarettes   Smokeless tobacco: Never Used  Substance Use Topics   Alcohol use: No   Drug use: No     Allergies   Patient has no known allergies.   Review of Systems Review of Systems  Constitutional: Negative for chills and fever.  HENT: Negative for congestion and rhinorrhea.   Eyes: Negative for redness and visual disturbance.  Respiratory: Negative for shortness of breath and wheezing.   Cardiovascular: Negative for chest pain and palpitations.  Gastrointestinal: Positive for vomiting. Negative for nausea.  Genitourinary: Negative for dysuria  and urgency.  Musculoskeletal: Negative for arthralgias, myalgias and neck pain.  Skin: Negative for pallor and wound.  Neurological: Positive for dizziness. Negative for headaches.     Physical Exam Updated Vital Signs BP (!) 166/89    Pulse 80    Temp 97.7 F (36.5 C) (Oral)    Resp 16    SpO2 97%   Physical Exam Vitals signs and nursing note reviewed.  Constitutional:      General: She is not in acute distress.    Appearance: She is well-developed. She is not diaphoretic.  HENT:     Head: Normocephalic and atraumatic.  Eyes:     Pupils: Pupils are equal, round, and reactive to light.  Neck:     Musculoskeletal: Normal range of motion and neck supple.  Cardiovascular:     Rate and Rhythm: Normal rate and regular rhythm.     Heart sounds: No murmur. No friction rub. No gallop.   Pulmonary:     Effort: Pulmonary effort is normal.     Breath sounds: No wheezing or rales.  Abdominal:     General: There is no distension.     Palpations: Abdomen is soft.     Tenderness: There is no abdominal tenderness.  Musculoskeletal:        General: No tenderness.  Skin:    General: Skin is warm and dry.  Neurological:     Mental Status: She is alert and oriented to person, place, and time.     GCS: GCS eye subscore is 4. GCS verbal subscore is 5. GCS motor subscore is 6.     Cranial Nerves: Cranial nerves are intact.     Sensory: Sensation is intact.     Motor: Motor function is intact.     Coordination: Coordination is intact.     Gait: Gait abnormal.     Comments: Subtle and inconsistent weakness with the right upper extremity compared to left.  Patient is able to ambulate with significant assistance as she persistently leans to the right when she tries to walk.  No nystagmus.  Psychiatric:        Behavior: Behavior normal.      ED Treatments / Results  Labs (all labs ordered are listed, but only abnormal results are displayed) Labs Reviewed  BASIC METABOLIC PANEL -  Abnormal; Notable for the following components:      Result Value   Glucose, Bld 145 (*)    All other components within normal limits  CBC - Abnormal; Notable for the following components:   WBC 16.7 (*)    RBC 5.36 (*)    Hemoglobin 15.6 (*)    HCT 47.2 (*)    All other components within normal limits  CBG MONITORING, ED - Abnormal; Notable for the following components:   Glucose-Capillary 151 (*)    All other components within normal limits  SARS CORONAVIRUS 2 (TAT 6-24 HRS)  HEPATIC  FUNCTION PANEL  ETHANOL  URINALYSIS, ROUTINE W REFLEX MICROSCOPIC  RAPID URINE DRUG SCREEN, HOSP PERFORMED    EKG EKG Interpretation  Date/Time:  Friday December 20 2018 11:02:59 EST Ventricular Rate:  84 PR Interval:  140 QRS Duration: 88 QT Interval:  402 QTC Calculation: 475 R Axis:   71 Text Interpretation: Sinus rhythm with marked sinus arrhythmia with frequent Premature ventricular complexes Cannot rule out Anterior infarct , age undetermined ST & T wave abnormality, consider inferior ischemia Abnormal ECG Otherwise no significant change Confirmed by Deno Etienne [phone]) on 12/20/2018 11:19:13 AM   Radiology Mr Brain Wo Contrast  Result Date: 12/20/2018 CLINICAL DATA:  Nausea and vomiting, dizziness EXAM: MRI HEAD WITHOUT CONTRAST TECHNIQUE: Multiplanar, multiecho pulse sequences of the brain and surrounding structures were obtained without intravenous contrast. COMPARISON:  None. FINDINGS: Brain: Small focus of reduced diffusion is present at the right posterolateral aspect of the medulla. There are chronic small vessel infarcts of the centrum semiovale and corona radiata, basal ganglia, thalamus, pons, and cerebellum. Additional patchy and confluent areas of T2 hyperintensity in the supratentorial white matter are nonspecific but probably reflect moderate chronic microvascular ischemic changes. There is ex vacuo dilatation of the lateral ventricles. There is no intracranial mass, mass effect,  or edema. There is no hydrocephalus or extra-axial fluid collection. Vascular: Major vessel flow voids at the skull base are preserved. Skull and upper cervical spine: Normal marrow signal is preserved. Sinuses/Orbits: Paranasal sinuses are aerated. Orbits are unremarkable. Other: Sella is unremarkable.  Mastoid air cells are clear. IMPRESSION: Small acute infarct of the right posterolateral medulla. Moderate chronic microvascular ischemic changes and multiple chronic small vessel infarcts. Electronically Signed   By: Macy Mis M.D.   On: 12/20/2018 13:26    Procedures Procedures (including critical care time)  Medications Ordered in ED Medications  sodium chloride flush (NS) 0.9 % injection 3 mL (3 mLs Intravenous Given 12/20/18 1345)  meclizine (ANTIVERT) tablet 25 mg (25 mg Oral Given 12/20/18 1400)  prochlorperazine (COMPAZINE) injection 10 mg (10 mg Intravenous Given 12/20/18 1349)  diphenhydrAMINE (BENADRYL) injection 25 mg (25 mg Intravenous Given 12/20/18 1349)  sodium chloride 0.9 % bolus 1,000 mL (1,000 mLs Intravenous New Bag/Given 12/20/18 1348)     Initial Impression / Assessment and Plan / ED Course  I have reviewed the triage vital signs and the nursing notes.  Pertinent labs & imaging results that were available during my care of the patient were reviewed by me and considered in my medical decision making (see chart for details).        63 yo F with a chief complaints of dizziness.  She describes as more of a sensation like she may pass out though has had significant issues ambulating where she consistently leans to the right.  She has that same gait here in the emergency department.  This is concerning for a posterior circulation stroke.  She otherwise has a benign neurologic exam.  Will obtain an MRI.  Symptomatic therapy.  Laboratory evaluation fluids headache cocktail and reassess.  MRI is consistent with a stroke.  Case was discussed with neurology will come and  evaluate at bedside.  Will admit.  The patients results and plan were reviewed and discussed.   Any x-rays performed were independently reviewed by myself.   Differential diagnosis were considered with the presenting HPI.  Medications  sodium chloride flush (NS) 0.9 % injection 3 mL (3 mLs Intravenous Given 12/20/18 1345)  meclizine (ANTIVERT) tablet 25 mg (  25 mg Oral Given 12/20/18 1400)  prochlorperazine (COMPAZINE) injection 10 mg (10 mg Intravenous Given 12/20/18 1349)  diphenhydrAMINE (BENADRYL) injection 25 mg (25 mg Intravenous Given 12/20/18 1349)  sodium chloride 0.9 % bolus 1,000 mL (1,000 mLs Intravenous New Bag/Given 12/20/18 1348)    Vitals:   12/20/18 1049  BP: (!) 166/89  Pulse: 80  Resp: 16  Temp: 97.7 F (36.5 C)  TempSrc: Oral  SpO2: 97%    Final diagnoses:  Posterior circulation stroke (HCC)    Admission/ observation were discussed with the admitting physician, patient and/or family and they are comfortable with the plan.    Final Clinical Impressions(s) / ED Diagnoses   Final diagnoses:  Posterior circulation stroke [facility])    ED Discharge Orders    None       Deno Etienne, DO 12/20/18 1519

## 2018-12-20 NOTE — H&P (Signed)
History and Physical    Ashley Price [id number] DOB: September 26, 1955 DOA: 12/20/2018  PCP: Donald Prose, MD  Patient coming from: Home  I have personally briefly reviewed patient's old medical records in [place]  Chief Complaint: Headache and dizziness since yesterday with difficulty with balance and falls at home  HPI: Ashley Price is a 63 y.o. female with medical history significant of hypertension and osteoarthritis who presents to the emergency department with a chief complaint of dizziness which began yesterday.  It is difficult to describe but when she stands up she feels like she may pass out.  She has had difficulty walking and has been falling to the right side she cannot walk properly.  She has fallen a couple times but has not seriously injured herself.  She has been walking with a walker due to bilateral knee pain.  She has not had any problems with imbalance in the past.  Symptoms began abruptly yesterday when she had a right-sided headache that went to her right ear and then resolved.  She has had no head trauma.  No difficulty with speech or swallowing.  She denies one-sided weakness or numbness.  She denies history of stroke in the past.  Head movement does not seem to make this worse.  She has had no cough, congestion, fever, diarrhea. She has had some mild emesis and has had normal intake.  Symptoms are of moderate severity and have had an abrupt onset.  Been present for 2 days and have been worsening.  ED Course: MRI consistent with acute stroke of the right posterior medulla  Review of Systems: As per HPI otherwise all other systems reviewed and  negative.   Past Medical History:  Diagnosis Date  . Hypertension   . Osteoarthritis     Past Surgical History:  Procedure Laterality Date  . AMPUTATION Left 08/18/2018   Procedure: AMPUTATION LEFT 2ND TOE;  Surgeon: Tania Ade, MD;  Location: [facility];  Service: Orthopedics;  Laterality: Left;  . TUBAL LIGATION  Bilateral     Social History   Social History Narrative   Lives with her children     reports that she has been smoking cigarettes. She has never used smokeless tobacco. She reports that she does not drink alcohol or use drugs.  No Known Allergies  Family History  Problem Relation Age of Onset  . Breast cancer Mother   . Kidney failure Brother     Prior to Admission medications   Medication Sig Start Date End Date Taking? Authorizing Provider  diclofenac sodium (VOLTAREN) 1 % GEL Apply 2 g topically 4 (four) times daily. 03/29/17   Tasia Catchings, Amy V, PA-C  ibuprofen (ADVIL,MOTRIN) 800 MG tablet Take 1 tablet (800 mg total) by mouth 3 (three) times daily. 05/03/17   Zigmund Gottron, NP  lisinopril-hydrochlorothiazide (PRINZIDE,ZESTORETIC) 20-12.5 MG tablet Take 1 tablet by mouth daily.    [provider]  nabumetone (RELAFEN) 500 MG tablet Take 500 mg by mouth 2 (two) times daily.    [provider]    Physical Exam:  Constitutional: NAD, calm, comfortable Vitals:   12/20/18 1049  BP: (!) 166/89  Pulse: 80  Resp: 16  Temp: 97.7 F (36.5 C)  TempSrc: Oral  SpO2: 97%   Eyes: PERRL, lids and conjunctivae normal ENMT: Mucous membranes are moist. Posterior pharynx clear of any exudate or lesions.Normal dentition.  Neck: normal, supple, no masses, no thyromegaly Respiratory: clear to auscultation bilaterally, no wheezing, no crackles.  Normal respiratory effort. No accessory muscle use.  Cardiovascular: Regular rate and rhythm, no murmurs / rubs / gallops. No extremity edema. 2+ pedal pulses. No carotid bruits.  Abdomen: no tenderness, no masses palpated. No hepatosplenomegaly. Bowel sounds positive.  Musculoskeletal: no clubbing / cyanosis. No joint deformity upper and lower extremities. Good ROM, no contractures. Normal muscle tone.  Skin: no rashes, lesions, ulcers. No induration Neurologic: CN 2-12 grossly intact. Sensation intact, DTR normal. Strength 5/5 in left  upper extremity and bilateral lower extremities.  Subtle weakness in the right upper extremity difficult to describe.  No nystagmus Psychiatric: Normal judgment and insight. Alert and oriented x 3. Normal mood.    Labs on Admission: I have personally reviewed following labs and imaging studies  CBC: Recent Labs  Lab 12/20/18 1311  WBC 16.7*  HGB 15.6*  HCT 47.2*  MCV 88.1  PLT [id number]   Basic Metabolic Panel: Recent Labs  Lab 12/20/18 1311  NA 139  K 3.7  CL 99  CO2 27  GLUCOSE 145*  BUN 8  CREATININE 0.97  CALCIUM 9.8   Liver Function Tests: Recent Labs  Lab 12/20/18 1411  AST 15  ALT 10  ALKPHOS 80  BILITOT 0.6  PROT 8.1  ALBUMIN 4.1   CBG: Recent Labs  Lab 12/20/18 1321  GLUCAP 151*   Urine analysis:    Component Value Date/Time   COLORURINE STRAW (A) 08/17/2018 1234   APPEARANCEUR CLEAR 08/17/2018 1234   LABSPEC 1.004 (L) 08/17/2018 1234   PHURINE 6.0 08/17/2018 [place] 08/17/2018 [place] 08/17/2018 1234   [place] 08/17/2018 [place] 08/17/2018 1234   PROTEINUR NEGATIVE 08/17/2018 1234   NITRITE NEGATIVE 08/17/2018 1234   LEUKOCYTESUR TRACE (A) 08/17/2018 1234    Radiological Exams on Admission: Mr Brain Wo Contrast  Result Date: 12/20/2018 CLINICAL DATA:  Nausea and vomiting, dizziness EXAM: MRI HEAD WITHOUT CONTRAST TECHNIQUE: Multiplanar, multiecho pulse sequences of the brain and surrounding structures were obtained without intravenous contrast. COMPARISON:  None. FINDINGS: Brain: Small focus of reduced diffusion is present at the right posterolateral aspect of the medulla. There are chronic small vessel infarcts of the centrum semiovale and corona radiata, basal ganglia, thalamus, pons, and cerebellum. Additional patchy and confluent areas of T2 hyperintensity in the supratentorial white matter are nonspecific but probably reflect moderate chronic microvascular ischemic changes. There  is ex vacuo dilatation of the lateral ventricles. There is no intracranial mass, mass effect, or edema. There is no hydrocephalus or extra-axial fluid collection. Vascular: Major vessel flow voids at the skull base are preserved. Skull and upper cervical spine: Normal marrow signal is preserved. Sinuses/Orbits: Paranasal sinuses are aerated. Orbits are unremarkable. Other: Sella is unremarkable.  Mastoid air cells are clear. IMPRESSION: Small acute infarct of the right posterolateral medulla. Moderate chronic microvascular ischemic changes and multiple chronic small vessel infarcts. Electronically Signed   By: Macy Mis M.D.   On: 12/20/2018 13:26    EKG: Independently reviewed by me shows sinus rhythm with sinus arrhythmia and PVCs there are some abnormal ST-T wave abnormalities which is unchanged from prior.  Assessment/Plan Principal Problem:   Stroke due to thrombosis of right vertebral artery [facility]) Active Problems:   Essential hypertension   Osteoarthritis   Fall at home, initial encounter   Stroke due to embolism of right vertebral artery ([place])   Stroke due to thrombosis of right vertebral artery: -Will admit to inpatient status for stroke -Telemetry  monitoring -Carotid dopplers -Echo -Risk stratification with FLP, A1c; will also check TSH and UDS -ASA daily -Neurology consult -PT/OT/ST/Nutrition Consults   HTN -Allow permissive HTN for now -Treat BP only if >220/120, and then with goal of 15% reduction -Hold  lisinopril hydrochlorothiazide and plan to restart in 48-72 hours   Osteoarthritis: Continue Relafen 500 mg p.o. twice daily    DVT prophylaxis:  Lovenox  Code Status: Full - confirmed with patient/family Family Communication:  No family present at the time of admission.  Patient did not request conversation Disposition Plan:  Home once clinically improved Consults called: Neurology; PT/OT/ST/SW/Nutrition  Admission status: Admit - It is my clinical opinion  that admission to INPATIENT is reasonable and necessary because of the expectation that this patient will require hospital care that crosses at least 2 midnights to treat this condition based on the medical complexity of the problems presented.  Given the aforementioned information, the predictability of an adverse outcome is felt to be significant.   Lady Deutscher MD FACP [facility] Pager [phone]  How to contact the [facility] Attending or Consulting provider [place] or covering provider during after hours [place], for this patient?  1. Check the care team in [facility] and look for a) attending/consulting [facility] provider listed and b) the [facility] team listed 2. Log into [email or web address] and use [place]'s universal password to access. If you do not have the password, please contact the hospital operator. 3. Locate the [facility] provider you are looking for under [facility] and page to a number that you can be directly reached. 4. If you still have difficulty reaching the provider, please page the [facility] (Director on Call) for the Hospitalists listed on amion for assistance.  If 7PM-7AM, please contact night-coverage [email or web address] Password [facility]  12/20/2018, 3:26 PM

## 2018-12-21 ENCOUNTER — Inpatient Hospital Stay (HOSPITAL_COMMUNITY): Payer: Medicare HMO

## 2018-12-21 DIAGNOSIS — I63011 Cerebral infarction due to thrombosis of right vertebral artery: Secondary | ICD-10-CM

## 2018-12-21 DIAGNOSIS — M1990 Unspecified osteoarthritis, unspecified site: Secondary | ICD-10-CM

## 2018-12-21 DIAGNOSIS — I10 Essential (primary) hypertension: Secondary | ICD-10-CM

## 2018-12-21 DIAGNOSIS — E785 Hyperlipidemia, unspecified: Secondary | ICD-10-CM

## 2018-12-21 LAB — HEMOGLOBIN A1C
Hgb A1c MFr Bld: 7 % — ABNORMAL HIGH (ref 4.8–5.6)
Mean Plasma Glucose: 154.2 mg/dL

## 2018-12-21 LAB — GLUCOSE, CAPILLARY: Glucose-Capillary: 107 mg/dL — ABNORMAL HIGH (ref 70–99)

## 2018-12-21 LAB — LIPID PANEL
Cholesterol: 143 mg/dL (ref 0–200)
HDL: 27 mg/dL — ABNORMAL LOW (ref >40)
LDL Cholesterol: 90 mg/dL (ref 0–99)
Total CHOL/HDL Ratio: 5.3 RATIO
Triglycerides: 130 mg/dL (ref <150)
VLDL: 26 mg/dL (ref 0–40)

## 2018-12-21 MED ORDER — INSULIN ASPART 100 UNIT/ML ~~LOC~~ SOLN
0.0000 [IU] | Freq: Three times a day (TID) | SUBCUTANEOUS | Status: DC
  Administered 2018-12-22 – 2018-12-23 (×4): 1 [IU] via SUBCUTANEOUS

## 2018-12-21 MED ORDER — STROKE: EARLY STAGES OF RECOVERY BOOK
Status: AC
  Filled 2018-12-21: qty 1

## 2018-12-21 MED ORDER — ATORVASTATIN CALCIUM 40 MG PO TABS
40.0000 mg | ORAL_TABLET | Freq: Every day | ORAL | Status: DC
  Administered 2018-12-21 – 2018-12-22 (×2): 40 mg via ORAL
  Filled 2018-12-21 (×2): qty 1

## 2018-12-21 MED ORDER — ASPIRIN EC 81 MG PO TBEC
81.0000 mg | DELAYED_RELEASE_TABLET | Freq: Every day | ORAL | Status: DC
  Administered 2018-12-21 – 2018-12-23 (×3): 81 mg via ORAL
  Filled 2018-12-21 (×3): qty 1

## 2018-12-21 MED ORDER — ONDANSETRON HCL 4 MG/2ML IJ SOLN
4.0000 mg | Freq: Four times a day (QID) | INTRAMUSCULAR | Status: DC | PRN
  Administered 2018-12-21: 21:00:00 4 mg via INTRAVENOUS
  Filled 2018-12-21: qty 2

## 2018-12-21 NOTE — Progress Notes (Signed)
Carotid duplex       has been completed. Preliminary results can be found under CV proc through chart review. Eulalio Reamy, BS, RDMS, RVT   

## 2018-12-21 NOTE — Progress Notes (Signed)
Patient c/o nausea feeling requesting medication; on call notified.

## 2018-12-21 NOTE — Progress Notes (Signed)
Occupational Therapy Evaluation Patient Details Name: SYLVIANA SIRMON MRN: [id number] DOB: 12-21-1955 Today's Date: 12/21/2018    History of Present Illness THUYTRANG HARK is a 63 y.o. female with medical history significant of hypertension and osteoarthritis who presents to the emergency department with a chief complaint of dizziness, imbalance, and R sided headache. MRI revealed acute stroke of R posterior medulla.   Clinical Impression   PTA, pt was living in two story home with older children, reporting independence with all BADLs and IADLs. Pt currently presenting with decreased balance, activity tolerance. Pt performed oral care and functional mobility with min A, and LB dressing with min guard A for safety and balance. Pt with episodes of LOB standing at sink, requiring min A to correct.  Pt with poor awareness of deficits and safety as seen by pt reporting that nothing is wrong and believes she is safe to go home. Pt would benefit from continued OT acutely to address deficits and facilitate safe dc. Recommend dc to CIR to due to poor safety awareness and to increase safe performance of ADLs.     Follow Up Recommendations  CIR;Supervision/Assistance - 24 hour    Equipment Recommendations  Other (comment)(defer to next venue)    Recommendations for Other Services Rehab consult;PT consult     Precautions / Restrictions Precautions Precautions: Fall Restrictions Weight Bearing Restrictions: No      Mobility Bed Mobility Overal bed mobility: Modified Independent             General bed mobility comments: Pt walking in hallway with PT upon arrival  Transfers Overall transfer level: Needs assistance Equipment used: Rolling walker (2 wheeled) Transfers: Sit to/from Stand Sit to Stand: Min guard         General transfer comment: Pt requiring min guard A for safety and balance during sit<>stand    Balance Overall balance assessment: Needs assistance Sitting-balance  support: Feet supported Sitting balance-Leahy Scale: Good     Standing balance support: During functional activity;No upper extremity supported Standing balance-Leahy Scale: Poor Standing balance comment: Pt requiring min A for balance standing at sink during oral care task                           ADL either performed or assessed with clinical judgement   ADL Overall ADL's : Needs assistance/impaired Eating/Feeding: Sitting;Independent   Grooming: Oral care;Wash/dry face;Min guard;Minimal assistance;Standing Grooming Details (indicate cue type and reason): Pt performed oral care and face washing standing at sink. Pt required min guard A- min A for balance and stability. Pt had episodes of LOB standing at sink, requiring min A to correct. Pt with slow movements and requiring increased time to perform oral care.   Upper Body Bathing: Min guard;Sitting   Lower Body Bathing: Sit to/from stand;Min guard   Upper Body Dressing : Min guard;Sitting   Lower Body Dressing: Sit to/from stand;Min guard Lower Body Dressing Details (indicate cue type and reason): Pt performed figure 4 method for donning socks and shoes. Pt requiring min guard A for safety and balance during sit <>stand Toilet Transfer: Minimal assistance;Ambulation;RW Toilet Transfer Details (indicate cue type and reason): Pt required min A for balance during functional mobility  Toileting- Clothing Manipulation and Hygiene: Min guard;Sit to/from stand;Minimal assistance       Functional mobility during ADLs: Minimal assistance;Rolling walker General ADL Comments: Pt performing all BADLs with min guard A- min A for safety and balance. Focused session  on oral care standing at sink, had episodes of LOB requiring min A to correct.      Vision Baseline Vision/History: No visual deficits Patient Visual Report: No change from baseline       Perception     Praxis      Pertinent Vitals/Pain Pain Assessment:  No/denies pain     Hand Dominance Right   Extremity/Trunk Assessment Upper Extremity Assessment Upper Extremity Assessment: Overall WFL for tasks assessed   Lower Extremity Assessment Lower Extremity Assessment: Defer to PT evaluation RLE Deficits / Details: Strength 5/5 RLE Coordination: decreased gross motor LLE Deficits / Details: Strength 5/5 LLE Coordination: decreased gross motor   Cervical / Trunk Assessment Cervical / Trunk Assessment: Normal   Communication Communication Communication: No difficulties   Cognition Arousal/Alertness: Awake/alert Behavior During Therapy: WFL for tasks assessed/performed Overall Cognitive Status: Impaired/Different from baseline Area of Impairment: Memory;Following commands;Safety/judgement;Problem solving;Awareness                     Memory: Decreased short-term memory Following Commands: Follows one step commands inconsistently;Follows one step commands with increased time Safety/Judgement: Decreased awareness of safety;Decreased awareness of deficits Awareness: Intellectual Problem Solving: Slow processing;Decreased initiation;Requires verbal cues General Comments: Pt presenting with poor awareness of safety and deficits. Pt had several LOB during standing grooming task at sink, requiring assist to correct. Pt with poor STM as seen by difficulty recalling cues for hand placements ~3 minutes after cue was initially given. Pt reports understanding safety cues for slowing down and using RW, however did not implement cues during functional mobility. Pt required increased time and cues for following simple commands as seen by following cues during sit<>stand   General Comments       Exercises     Shoulder Instructions      Home Living Family/patient expects to be discharged to:: Private residence Living Arrangements: Children Available Help at Discharge: Family Type of Home: House Home Access: Stairs to enter Engineer, site of Steps: 1   Home Layout: Two level;Able to live on main level with bedroom/bathroom     Bathroom Shower/Tub: Occupational psychologist: Standard     Home Equipment: Environmental consultant - 4 wheels;Cane - single point;Shower seat;Wheelchair - manual          Prior Functioning/Environment Level of Independence: Independent        Comments: Pt reports independence with all BADLs and IADLs.        OT Problem List: Decreased strength;Decreased range of motion;Decreased activity tolerance;Impaired balance (sitting and/or standing);Decreased cognition;Decreased safety awareness;Decreased knowledge of use of DME or AE;Decreased knowledge of precautions      OT Treatment/Interventions: Self-care/ADL training;DME and/or AE instruction;Cognitive remediation/compensation;Patient/family education;Balance training    OT Goals(Current goals can be found in the care plan section) Acute Rehab OT Goals Patient Stated Goal: go home OT Goal Formulation: With patient Time For Goal Achievement: 01/04/19 Potential to Achieve Goals: Good  OT Frequency: Min 2X/week   Barriers to D/C:            Co-evaluation              AM-PAC OT "6 Clicks" Daily Activity     Outcome Measure Help from another person eating meals?: None Help from another person taking care of personal grooming?: A Little Help from another person toileting, which includes using toliet, bedpan, or urinal?: A Little Help from another person bathing (including washing, rinsing, drying)?: A Little Help from another person to  put on and taking off regular upper body clothing?: None Help from another person to put on and taking off regular lower body clothing?: A Little 6 Click Score: 20   End of Session Equipment Utilized During Treatment: Gait belt;Rolling walker Nurse Communication: Mobility status  Activity Tolerance: Patient tolerated treatment well Patient left: in chair;with call bell/phone within  reach;with chair alarm set  OT Visit Diagnosis: Unsteadiness on feet (R26.81);Other abnormalities of gait and mobility (R26.89);Repeated falls (R29.6);History of falling (Z91.81);Other symptoms and signs involving cognitive function                Time: [id number] OT Time Calculation (min): 28 min Charges:  OT Evaluation $OT Eval Moderate Complexity: 1 Mod OT Treatments $Self Care/Home Management : 8-22 mins  Gus Rankin, OT Student  Di Kindle Jayle Solarz 12/21/2018, 10:02 AM

## 2018-12-21 NOTE — Progress Notes (Signed)
STROKE TEAM PROGRESS NOTE   HISTORY OF PRESENT ILLNESS (per record) Ashley Price is an 63 y.o. female with PMHx significant for HTN who presented to [facility] ED with c/o dizziness and emesis.   Per patient when she went to bed last night she had dizziness (described as like "floating on a boat" but non-vertiginious) and she had emesis. She went to bed. When she woke up about 0300 this morning to use the bathroom she still had dizziness, felt unsteady on her feet and she fell. Denies hitting her head or LOC. She stated that she has been unsteady while walking with her walker. She felt that she was leaning more to the right but her left leg felt a little weak as well. She also had a HA yesterday during the day, but did not have one when the dizziness began. Denies chest pain. SOB, vision changes, numbness or tingling, ETOH or drug use. Endorses smoking 1/2 pack of cigarettes per day. She does not take an antiplatelet medication.   ED course:  MRI: acute infarct of the right posterolateral medulla BP: 196/93 BG: 151   Date last known well: 12/19/2018 tPA Given: no; outside of window                                          Modified Rankin: Rankin Score=2 NIHSS: 1 right arm drift   INTERVAL HISTORY (subjective) Patient is still in the chair and eating breakfast. States she no longer has any dizziness and is back to baseline. Nurse states PT walked her and said she is unsteady and are recommending CIR    OBJECTIVE Vitals:   12/20/18 2010 12/20/18 2212 12/20/18 2351 12/21/18 0316  BP: (!) 194/82 (!) 196/75 (!) 192/88 (!) 185/92  Pulse: (!) 57 (!) 59 61 62  Resp: 18 18 18 18   Temp: 98 F (36.7 C) 97.9 F (36.6 C) 97.9 F (36.6 C) 98.4 F (36.9 C)  TempSrc: Oral Oral Oral Oral  SpO2: 100% 100% 97% 95%  Weight: 81.4 kg     Height: 5' 3.5" (1.613 m)       CBC:  Recent Labs  Lab 12/20/18 1311  WBC 16.7*  HGB 15.6*  HCT 47.2*  MCV 88.1  PLT [id number]    Basic Metabolic Panel:   Recent Labs  Lab 12/20/18 1311  NA 139  K 3.7  CL 99  CO2 27  GLUCOSE 145*  BUN 8  CREATININE 0.97  CALCIUM 9.8    Lipid Panel:     Component Value Date/Time   CHOL 143 12/21/2018 0315   TRIG 130 12/21/2018 0315   HDL 27 (L) 12/21/2018 0315   CHOLHDL 5.3 12/21/2018 0315   VLDL 26 12/21/2018 0315   LDLCALC 90 12/21/2018 0315   HgbA1c:  Lab Results  Component Value Date   HGBA1C 7.0 (H) 12/21/2018   Urine Drug Screen: No results found for: LABOPIA, COCAINSCRNUR, LABBENZ, AMPHETMU, THCU, LABBARB  Alcohol Level     Component Value Date/Time   [facility] <10 12/20/2018 1312    IMAGING  Mr Brain Wo Contrast 12/20/2018 IMPRESSION:  Small acute infarct of the right posterolateral medulla. Moderate chronic microvascular ischemic changes and multiple chronic small vessel infarcts.   Transthoracic Echocardiogram  12/20/2018 IMPRESSIONS  1. Left ventricular ejection fraction, by visual estimation, is 55 to 60%. The left ventricle has normal function. There is no left  ventricular hypertrophy.  2. The left ventricle has no regional wall motion abnormalities.  3. Global right ventricle has normal systolic function.The right ventricular size is normal. No increase in right ventricular wall thickness.  4. Left atrial size was normal.  5. Right atrial size was normal.  6. Presence of pericardial fat pad.  7. Small pericardial effusion.  8. The mitral valve is normal in structure. No evidence of mitral valve regurgitation. No evidence of mitral stenosis.  9. The tricuspid valve is normal in structure. Tricuspid valve regurgitation is not demonstrated. 10. The aortic valve is normal in structure. Aortic valve regurgitation is not visualized. No evidence of aortic valve sclerosis or stenosis. 11. The pulmonic valve was normal in structure. Pulmonic valve regurgitation is not visualized.  Bilateral Carotid Dopplers  00/00/2020 Pending  ECG - SR rate 84 BPM. (See cardiology reading  for complete details)    PHYSICAL EXAM Blood pressure (!) 185/92, pulse 62, temperature 98.4 F (36.9 C), temperature source Oral, resp. rate 18, height 5' 3.5" (1.613 m), weight 81.4 kg, SpO2 95 %.  NIHSS 0  Mental Status: Alert, oriented, thought content appropriate.  Naming and repetition intact. Speech fluent without evidence of aphasia. No dysarthria noted.  Able to follow commands without difficulty. Cranial Nerves: II:  Visual fields grossly normal, PERRL with no miosis noted.  III,IV, VI: Palpebral fissures equal with no ptosis appreciated. EOMI with no nystagmus seen.  V,VII: Smile symmetric, facial light touch, cool temp and pinprick sensation equal bilaterally VIII: hearing intact to voice IX,X: Palate rises equally.  XI: Symmetric shoulder shrug XII: midline tongue extension Motor: Right :  Upper extremity   5/5              Left:     Upper extremity   5/5             Lower extremity   5/5                          Lower extremity   5/5 Tone and bulk:normal tone throughout; no atrophy noted Sensory: cool, temp, pinprick and light touch intact throughout, bilaterally without asymmetry Deep Tendon Reflexes: 2+ and symmetric biceps and patellae Plantars: Right: downgoing                           Left: downgoing Cerebellar: No ataxia with FNF, RAM or H-S bilaterally  Gait: Deferred due to falls risk concerns      ASSESSMENT/PLAN [name] is a 63 y.o. female with history of Htn, osteoarthritis, and tobacco use presenting with dizziness, emesis and BP196/93.  She did not receive IV t-PA due to late presentation (>4.5 hours from time of onset).   Stroke:  small acute infarct of the right posterolateral medulla - small vessel disease  Code Stroke CT Head - not ordered  CT head - not ordered  MRI head - Small acute infarct of the right posterolateral medulla. Moderate chronic microvascular ischemic changes and multiple chronic small vessel infarcts.   MRA  head - not ordered  CTA H&N - not ordered  CT Perfusion - not ordered  Carotid Doppler - pending  2D Echo - EF 55 to 60%. No cardiac source of emboli identified.   Sars Corona Virus 2 - negative  LDL - 90  HgbA1c - 7.0  UDS - pending  VTE prophylaxis - Lovenox Diet  Diet Order  Diet renal/carb modified with fluid restriction Diet-HS Snack? Nothing; Fluid restriction: 1200 mL Fluid; Room service appropriate? Yes; Fluid consistency: Thin  Diet effective now               No antithrombotic prior to admission, now on No antithrombotic (asa 81 mg daily added)  Ongoing aggressive stroke risk factor management  Therapy recommendations:  pending  Disposition:  Pending  Hypertension  Home BP meds: none   Current BP meds: none   Blood pressure somewhat high at times but within post stroke/TIA parameters . Permissive hypertension (OK if < 220/120) but gradually normalize in 5-7 days  . Long-term BP goal normotensive  Hyperlipidemia  Home Lipid lowering medication: none   LDL 90, goal < 70  Current lipid lowering medication: none (consider Lipitor 40 mg daily)  Continue statin at discharge  Diabetes  Home diabetic meds: none   Current diabetic meds: none  HgbA1c7.0, goal < 7.0 Recent Labs    12/20/18 1321  GLUCAP 151*     Other Stroke Risk Factors  Advanced age  Cigarette smoker - advised to stop smoking  Obesity, Body mass index is 31.29 kg/m., recommend weight loss, diet and exercise as appropriate   Hx stroke/TIA   Other Active Problems  Leukocytosis - 16.7  (afebrile)  Hyperglycemia  PLAN  CTA H&N recommended by Dr Georgiana Shore (would not need carotid dopplers if CTA ordered)  Will start ASA  Will start [facility] day # 1    To contact Stroke Continuity provider, please refer to [email or web address] After hours, contact General Neurology

## 2018-12-21 NOTE — Plan of Care (Signed)
  Problem: Education: Goal: Knowledge of General Education information will improve Description: Including pain rating scale, medication(s)/side effects and non-pharmacologic comfort measures Outcome: Adequate for Discharge   Problem: Health Behavior/Discharge Planning: Goal: Ability to manage health-related needs will improve Outcome: Adequate for Discharge   Problem: Clinical Measurements: Goal: Ability to maintain clinical measurements within normal limits will improve Outcome: Adequate for Discharge Goal: Will remain free from infection Outcome: Adequate for Discharge Goal: Diagnostic test results will improve Outcome: Adequate for Discharge Goal: Respiratory complications will improve Outcome: Adequate for Discharge Goal: Cardiovascular complication will be avoided Outcome: Adequate for Discharge   Problem: Activity: Goal: Risk for activity intolerance will decrease Outcome: Adequate for Discharge   Problem: Nutrition: Goal: Adequate nutrition will be maintained Outcome: Adequate for Discharge   Problem: Coping: Goal: Level of anxiety will decrease Outcome: Adequate for Discharge   Problem: Elimination: Goal: Will not experience complications related to bowel motility Outcome: Adequate for Discharge Goal: Will not experience complications related to urinary retention Outcome: Adequate for Discharge   Problem: Pain Managment: Goal: General experience of comfort will improve Outcome: Adequate for Discharge   Problem: Safety: Goal: Ability to remain free from injury will improve Outcome: Adequate for Discharge   Problem: Skin Integrity: Goal: Risk for impaired skin integrity will decrease Outcome: Adequate for Discharge   Problem: Education: Goal: Knowledge of disease or condition will improve Outcome: Adequate for Discharge Goal: Knowledge of secondary prevention will improve Outcome: Adequate for Discharge Goal: Knowledge of patient specific risk factors  addressed and post discharge goals established will improve Outcome: Adequate for Discharge Goal: Individualized Educational Video(s) Outcome: Adequate for Discharge   Problem: Coping: Goal: Will verbalize positive feelings about self Outcome: Adequate for Discharge Goal: Will identify appropriate support needs Outcome: Adequate for Discharge   

## 2018-12-21 NOTE — Plan of Care (Signed)
  Problem: Education: Goal: Knowledge of General Education information will improve Description: Including pain rating scale, medication(s)/side effects and non-pharmacologic comfort measures Outcome: Progressing   Problem: Health Behavior/Discharge Planning: Goal: Ability to manage health-related needs will improve Outcome: Progressing   Problem: Clinical Measurements: Goal: Ability to maintain clinical measurements within normal limits will improve Outcome: Progressing Goal: Will remain free from infection Outcome: Progressing Goal: Diagnostic test results will improve Outcome: Progressing Goal: Respiratory complications will improve Outcome: Progressing Goal: Cardiovascular complication will be avoided Outcome: Progressing   Problem: Activity: Goal: Risk for activity intolerance will decrease Outcome: Progressing   Problem: Nutrition: Goal: Adequate nutrition will be maintained Outcome: Progressing   Problem: Coping: Goal: Level of anxiety will decrease Outcome: Progressing   Problem: Elimination: Goal: Will not experience complications related to bowel motility Outcome: Progressing Goal: Will not experience complications related to urinary retention Outcome: Progressing   Problem: Pain Managment: Goal: General experience of comfort will improve Outcome: Progressing   Problem: Safety: Goal: Ability to remain free from injury will improve Outcome: Progressing   Problem: Skin Integrity: Goal: Risk for impaired skin integrity will decrease Outcome: Progressing   Problem: Education: Goal: Knowledge of disease or condition will improve Outcome: Progressing Goal: Knowledge of secondary prevention will improve Outcome: Progressing Goal: Knowledge of patient specific risk factors addressed and post discharge goals established will improve Outcome: Progressing Goal: Individualized Educational Video(s) Outcome: Progressing   Problem: Coping: Goal: Will verbalize  positive feelings about self Outcome: Progressing Goal: Will identify appropriate support needs Outcome: Progressing   Alainna Stawicki, BSN, RN 

## 2018-12-21 NOTE — Evaluation (Signed)
Physical Therapy Evaluation Patient Details Name: Ashley Price MRN: [id number] DOB: 06-Nov-1955 Today's Date: 12/21/2018   History of Present Illness  Ashley Price is a 63 y.o. female with medical history significant of hypertension and osteoarthritis who presents to the emergency department with a chief complaint of dizziness, imbalance, and R sided headache. MRI revealed acute stroke of R posterior medulla.  Clinical Impression  Pt admitted with above. Pt presents with decreased functional mobility secondary to decreased coordination, poor dynamic balance, and decreased safety awareness. Pt requiring moderate assist for stability with use of walker with limited hallway distance; often with overt loss of balance, staggering towards right. Education provided re: proper use of DME, supervision/guarding for all mobility. Would benefit from CIR to address deficits and maximize functional independence. Pt is at high risk for recurrent falls based on deficits listed above.      Follow Up Recommendations CIR;Supervision/Assistance - 24 hour (may refuse; will need HHPT/OT)    Equipment Recommendations  Rolling walker with 5" wheels    Recommendations for Other Services Rehab consult     Precautions / Restrictions Precautions Precautions: Fall Restrictions Weight Bearing Restrictions: No      Mobility  Bed Mobility Overal bed mobility: Modified Independent                Transfers Overall transfer level: Needs assistance Equipment used: None Transfers: Sit to/from Stand Sit to Stand: Min guard            Ambulation/Gait Ambulation/Gait assistance: Mod assist Gait Distance (Feet): 160 Feet Assistive device: Rolling walker (2 wheeled) Gait Pattern/deviations: Step-through pattern;Decreased stance time - right;Staggering right;Wide base of support     General Gait Details: Pt requiring up to modA for stability, with 2-3 lateral loss of balance towards right with no  recognition by pt. Requiring cues for shorter step lengths, controlling momentum, stopping to regain balance, keeping feet apart on turns. Overall, decreased coordination and right stance time during single limb support phase.   Stairs            Wheelchair Mobility    Modified Rankin (Stroke Patients Only) Modified Rankin (Stroke Patients Only) Pre-Morbid Rankin Score: No symptoms Modified Rankin: Moderately severe disability     Balance Overall balance assessment: Needs assistance Sitting-balance support: Feet supported Sitting balance-Leahy Scale: Good     Standing balance support: During functional activity;No upper extremity supported Standing balance-Leahy Scale: Fair Standing balance comment: Fair statically, poor dynamically                             Pertinent Vitals/Pain Pain Assessment: No/denies pain    Home Living Family/patient expects to be discharged to:: Private residence Living Arrangements: Children(older) Available Help at Discharge: Family Type of Home: House Home Access: Stairs to enter   Technical brewer of Steps: 1 Home Layout: Able to live on main level with bedroom/bathroom Home Equipment: Walker - 4 wheels;Cane - single point;Wheelchair - manual      Prior Function Level of Independence: Independent         Comments: Community ambulatory; uses Rollator occasionally due to chronic bilateral knee pain     Hand Dominance   Dominant Hand: Right    Extremity/Trunk Assessment   Upper Extremity Assessment Upper Extremity Assessment: Defer to OT evaluation    Lower Extremity Assessment Lower Extremity Assessment: RLE deficits/detail;LLE deficits/detail RLE Deficits / Details: Strength 5/5 RLE Coordination: decreased gross motor LLE Deficits /  Details: Strength 5/5 LLE Coordination: decreased gross motor    Cervical / Trunk Assessment Cervical / Trunk Assessment: Normal  Communication   Communication: No  difficulties  Cognition Arousal/Alertness: Awake/alert Behavior During Therapy: WFL for tasks assessed/performed Overall Cognitive Status: Impaired/Different from baseline Area of Impairment: Safety/judgement                         Safety/Judgement: Decreased awareness of safety;Decreased awareness of deficits     General Comments: Pt with poor awareness of safety/deficits; often with overt loss of balance requiring assist by PT to correct and safety cues for slowing down and regaining balance before continuing      General Comments      Exercises     Assessment/Plan    PT Assessment Patient needs continued PT services  PT Problem List Decreased balance;Decreased mobility;Decreased coordination;Decreased safety awareness       PT Treatment Interventions DME instruction;Gait training;Stair training;Functional mobility training;Therapeutic activities;Therapeutic exercise;Balance training;Patient/family education    PT Goals (Current goals can be found in the Care Plan section)  Acute Rehab PT Goals Patient Stated Goal: "go home." PT Goal Formulation: With patient Time For Goal Achievement: 01/04/19 Potential to Achieve Goals: Good    Frequency Min 4X/week   Barriers to discharge        Co-evaluation               AM-PAC PT "6 Clicks" Mobility  Outcome Measure Help needed turning from your back to your side while in a flat bed without using bedrails?: None Help needed moving from lying on your back to sitting on the side of a flat bed without using bedrails?: None Help needed moving to and from a bed to a chair (including a wheelchair)?: A Little Help needed standing up from a chair using your arms (e.g., wheelchair or bedside chair)?: A Little Help needed to walk in hospital room?: A Lot Help needed climbing 3-5 steps with a railing? : A Lot 6 Click Score: 18    End of Session Equipment Utilized During Treatment: Gait belt Activity Tolerance:  Patient tolerated treatment well Patient left: Other (comment)(handoff to OT) Nurse Communication: Mobility status PT Visit Diagnosis: Unsteadiness on feet (R26.81);Other abnormalities of gait and mobility (R26.89);History of falling (Z91.81);Difficulty in walking, not elsewhere classified (R26.2);Other symptoms and signs involving the nervous system [id number])    Time: [id number] PT Time Calculation (min) (ACUTE ONLY): 22 min   Charges:   PT Evaluation $PT Eval Moderate Complexity: 1 Mod          Ellamae Sia, [place], DPT Acute Rehabilitation Services Pager [phone] Office [phone]   Willy Eddy 12/21/2018, 9:22 AM

## 2018-12-21 NOTE — Progress Notes (Signed)
PROGRESS NOTE   Ashley Price  [id number]    DOB: 04-Jun-1955    DOA: 12/20/2018  PCP: Donald Prose, MD   I have briefly reviewed patients previous medical records in [facility].  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  Admitted for acute stroke.  Neurology consulted.  Therapies recommend CIR.   Assessment & Plan:   Principal Problem:   Stroke due to thrombosis of right vertebral artery [facility]) Active Problems:   Osteoarthritis   Essential hypertension   Fall at home, initial encounter   Stroke due to embolism of right vertebral artery (HCC)   Acute stroke  Resultant dizziness.  No CT head performed.  MRI of brain: Small acute infarct of the right posterior lateral medulla.  Moderate chronic microvascular ischemic changes and multiple chronic small vessel infarcts.  Carotid Dopplers: Bilateral 1-39% ICA stenosis.  TTE: LVEF 55-60%.  Please see detailed report as below.  LDL 90.  A1c 7.0.  Not on antithrombotics PTA, now on aspirin 81 mg daily.  Therapies recommend dc to CIR to due to poor safety awareness and to increase safe performance of ADLs and patient is of high fall risk.  CIR consulted.  Tobacco cessation counseled.  Essential hypertension  Allow permissive hypertension (okay if <220/120) due to recent acute stroke and gradually normalize in 5 to 7 days.  Hold lisinopril-HCTZ.  Dyslipidemia  LDL 90, goal <70.  Started atorvastatin 40 mg daily.  Type II DM,  A1c 7.  Appears to be new diagnosis.  Recommend diabetic diet, lifestyle  modifications, monitor CBGs closely.  DM coordinator consulted.  SSI.  Tobacco abuse  Cessation counseled.  Body mass index is 31.29 kg/m./Obesity   Leukocytosis  Unclear etiology but appears chronic.  Outpatient follow-up.  DVT prophylaxis: Lovenox Code Status: Full Family Communication: None at bedside Disposition: Consult inpatient rehab   Consultants:  Neurology  Procedures:  TTE 12/21/2018:  IMPRESSIONS    1. Left ventricular ejection fraction, by visual estimation, is 55 to 60%. The left ventricle has normal function. There is no left ventricular hypertrophy.  2. The left ventricle has no regional wall motion abnormalities.  3. Global right ventricle has normal systolic function.The right ventricular size is normal. No increase in right ventricular wall thickness.  4. Left atrial size was normal.  5. Right atrial size was normal.  6. Presence of pericardial fat pad.  7. Small pericardial effusion.  8. The mitral valve is normal in structure. No evidence of mitral valve regurgitation. No evidence of mitral stenosis.  9. The tricuspid valve is normal in structure. Tricuspid valve regurgitation is not demonstrated. 10. The aortic valve is normal in structure. Aortic valve regurgitation is not visualized. No evidence of aortic valve sclerosis or stenosis. 11. The pulmonic valve was normal in structure. Pulmonic valve regurgitation is not visualized.   Antimicrobials:  None   Subjective: Denies prior history of unsteadiness or falls.  Has a cane and walker but does not use.  Reports arthritis of both her knees.  Feels better compared to admission.  No dizziness, lightheadedness or other strokelike symptoms.  Objective:  Vitals:   12/21/18 0316 12/21/18 0825 12/21/18 1316 12/21/18 1603  BP: (!) 185/92 (!) 188/86 (!) 180/94 (!) 172/90  Pulse: 62 63 73 74  Resp: 18 16 15 15   Temp: 98.4 F (36.9 C) 98 F (36.7 C) 98.3 F (36.8 C) 98.4 F (36.9 C)    TempSrc: Oral Oral Oral Oral  SpO2: 95% 98% 99% 99%  Weight:      Height:        Examination:  General exam: Pleasant middle-age female, moderately built and obese sitting up comfortably in chair without distress. Respiratory system: Clear to auscultation. Respiratory effort normal. Cardiovascular system: S1 & S2 heard, RRR. No JVD, murmurs, rubs, gallops or clicks. No pedal edema.  Telemetry personally reviewed: Sinus rhythm with occasional PVCs. Gastrointestinal system: Abdomen is nondistended, soft and nontender. No organomegaly or masses felt. Normal bowel sounds heard. Central nervous system: Alert and oriented. No focal neurological deficits.  No pronator drift. Extremities: Symmetric 5 x 5 power. Skin: No rashes, lesions or ulcers Psychiatry: Judgement and insight appear normal. Mood & affect appropriate.     Data Reviewed: I have personally reviewed following labs and imaging studies   CBC: Recent Labs  Lab 12/20/18 1311  WBC 16.7*  HGB 15.6*  HCT 47.2*  MCV 88.1  PLT [id number]    Basic Metabolic Panel: Recent Labs  Lab 12/20/18 1311  NA 139  K 3.7  CL 99  CO2 27  GLUCOSE 145*  BUN 8  CREATININE 0.97  CALCIUM 9.8    Liver Function Tests: Recent Labs  Lab 12/20/18 1411  AST 15  ALT 10  ALKPHOS 80  BILITOT 0.6  PROT 8.1  ALBUMIN 4.1    CBG: Recent Labs  Lab 12/20/18 1321  GLUCAP 151*    Recent Results (from the past 240 hour(s))  SARS CORONAVIRUS 2 (TAT 6-24 HRS) Nasopharyngeal Nasopharyngeal Swab     Status: None   Collection Time: 12/20/18  3:37 PM   Specimen: Nasopharyngeal Swab  Result Value Ref Range Status   SARS Coronavirus 2 NEGATIVE NEGATIVE Final    Comment: (NOTE) SARS-CoV-2 target nucleic acids are NOT DETECTED. The SARS-CoV-2 RNA is generally detectable in upper and lower respiratory specimens during the acute phase of infection. Negative results do not preclude SARS-CoV-2 infection, do not rule out co-infections with other  pathogens, and should not be used as the sole basis for treatment or other patient management decisions. Negative results must be combined with clinical observations, patient history, and epidemiological information. The expected result is Negative. Fact Sheet for Patients: SugarRoll.be Fact Sheet for Healthcare Providers: https://www.woods-mathews.com/ This test is not yet approved or cleared by the [place] FDA and  has been authorized for detection and/or diagnosis of SARS-CoV-2 by FDA under an Emergency Use Authorization (EUA). This EUA will remain  in effect (meaning this test can be used) for the duration of the COVID-19 declaration under Section 56 4(b)(1) of the Act, 21 U.S.C. section 360bbb-3(b)(1), unless the authorization is terminated or revoked sooner. Performed at [facility], [place] [address] [place],  [address]       Radiology Studies: Mr Brain 50 Contrast  Result Date: 12/20/2018 CLINICAL DATA:  Nausea and vomiting, dizziness EXAM: MRI HEAD WITHOUT CONTRAST TECHNIQUE: Multiplanar, multiecho pulse sequences of the brain and surrounding structures were obtained without intravenous contrast. COMPARISON:  None. FINDINGS: Brain: Small focus of reduced diffusion is present at the right posterolateral aspect of the medulla. There are chronic small vessel  infarcts of the centrum semiovale and corona radiata, basal ganglia, thalamus, pons, and cerebellum. Additional patchy and confluent areas of T2 hyperintensity in the supratentorial white matter are nonspecific but probably reflect moderate chronic microvascular ischemic changes. There is ex vacuo dilatation of the lateral ventricles. There is no intracranial mass, mass effect, or edema. There is no hydrocephalus or extra-axial fluid collection. Vascular: Major vessel flow voids at the skull base are preserved. Skull and upper cervical spine: Normal marrow signal is  preserved. Sinuses/Orbits: Paranasal sinuses are aerated. Orbits are unremarkable. Other: Sella is unremarkable.  Mastoid air cells are clear. IMPRESSION: Small acute infarct of the right posterolateral medulla. Moderate chronic microvascular ischemic changes and multiple chronic small vessel infarcts. Electronically Signed   By: Macy Mis M.D.   On: 12/20/2018 13:26   Vas US Carotid (at [facility] Only)  Result Date: 12/21/2018 Carotid Arterial Duplex Study Indications:       CVA. Risk Factors:      Hypertension. Other Factors:     Infarct of the right posterolateral medulla. Comparison Study:  no prior Performing Technologist: June Leap RDMS, RVT  Examination Guidelines: A complete evaluation includes B-mode imaging, spectral Doppler, color Doppler, and power Doppler as needed of all accessible portions of each vessel. Bilateral testing is considered an integral part of a complete examination. Limited examinations for reoccurring indications may be performed as noted.  Right Carotid Findings: +----------+--------+--------+--------+------------------+--------+             PSV cm/s EDV cm/s Stenosis Plaque Description Comments  +----------+--------+--------+--------+------------------+--------+  CCA Prox   71       14                                             +----------+--------+--------+--------+------------------+--------+  CCA Distal 67       13                                             +----------+--------+--------+--------+------------------+--------+  ICA Prox   54       16       1-39%    heterogenous                 +----------+--------+--------+--------+------------------+--------+  ICA Distal 63       15                                             +----------+--------+--------+--------+------------------+--------+  ECA        102      15                heterogenous                 +----------+--------+--------+--------+------------------+--------+  +----------+--------+-------+--------+-------------------+             PSV cm/s EDV cms Describe Arm Pressure (mmHG)  +----------+--------+-------+--------+-------------------+  Subclavian 320              Stenotic                      +----------+--------+-------+--------+-------------------+ +---------+--------+--+--------+-+---------+  Vertebral PSV cm/s 40 EDV cm/s 6 Antegrade  +---------+--------+--+--------+-+---------+  Left Carotid Findings: +----------+--------+--------+--------+------------------+--------+             PSV cm/s EDV cm/s Stenosis Plaque Description Comments  +----------+--------+--------+--------+------------------+--------+  CCA Prox   89       14                                             +----------+--------+--------+--------+------------------+--------+  CCA Distal 74       18                                             +----------+--------+--------+--------+------------------+--------+  ICA Prox   38       14       1-39%    heterogenous                 +----------+--------+--------+--------+------------------+--------+  ICA Distal 70       21                                             +----------+--------+--------+--------+------------------+--------+  ECA        105      16                heterogenous                 +----------+--------+--------+--------+------------------+--------+ +----------+--------+--------+----------------+-------------------+             PSV cm/s EDV cm/s Describe         Arm Pressure (mmHG)  +----------+--------+--------+----------------+-------------------+  Subclavian 139               Multiphasic, WNL                      +----------+--------+--------+----------------+-------------------+ +---------+--------+--+--------+--+---------+  Vertebral PSV cm/s 52 EDV cm/s 22 Antegrade  +---------+--------+--+--------+--+---------+  Summary: Right Carotid: Velocities in the right ICA are consistent with a 1-39% stenosis. Left Carotid: Velocities in the left ICA  are consistent with a 1-39% stenosis.  *See table(s) above for measurements and observations.     Preliminary           Scheduled Meds:  aspirin EC  81 mg Oral Daily   atorvastatin  40 mg Oral q1800   enoxaparin (LOVENOX) injection  40 mg Subcutaneous Q24H   nabumetone  500 mg Oral BID   Continuous Infusions:  sodium chloride 75 mL/hr at 12/21/18 0318     LOS: 1 day     Vernell Leep, MD, [place], [facility]. [facility]  To contact the attending provider between 7A-7P or the covering provider during after hours 7P-7A, please log into the web site [email or web address] and access using universal [place] password for that web site. If you do not have the password, please call the hospital operator.  12/21/2018, 5:31 PM

## 2018-12-21 NOTE — Progress Notes (Signed)
Second time tonight RN has entered paitent room and bed alarm has been turned off.  RN has reset it both times, will continue to monitor patient.  Educated patient about being high fall risk and needing to have assistance with her when moving about the room.  Alarm on and will continue to monitor patient.

## 2018-12-22 LAB — GLUCOSE, CAPILLARY
Glucose-Capillary: 149 mg/dL — ABNORMAL HIGH (ref 70–99)
Glucose-Capillary: 100 mg/dL — ABNORMAL HIGH (ref 70–99)
Glucose-Capillary: 127 mg/dL — ABNORMAL HIGH (ref 70–99)
Glucose-Capillary: 115 mg/dL — ABNORMAL HIGH (ref 70–99)

## 2018-12-22 MED ORDER — LIVING WELL WITH DIABETES BOOK
Freq: Once | Status: AC
  Administered 2018-12-22: 09:00:00
  Filled 2018-12-22: qty 1

## 2018-12-22 NOTE — Consult Note (Addendum)
Neurology Short Note  Carotid US complete, can consider outpatient MRA head or CTA head to look at intracranial circulation   Newly diagnosed DM  Stoke workup complete   Follow PT/OT/ST recommendations  Continue ASA/Statin  Will sign off, please call back with any questions

## 2018-12-22 NOTE — Progress Notes (Addendum)
PROGRESS NOTE   Ashley Price  [id number]    DOB: 09/20/1955    DOA: 12/20/2018  PCP: Donald Prose, MD   I have briefly reviewed patients previous medical records in [facility].  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  Admitted for acute stroke.  Neurology consulted.  Therapies recommend CIR.   Assessment & Plan:   Principal Problem:   Stroke due to thrombosis of right vertebral artery [place]) Active Problems:   Osteoarthritis   Essential hypertension   Fall at home, initial encounter   Stroke due to embolism of right vertebral artery (HCC)   Acute stroke  Resultant dizziness.  No CT head performed.  MRI of brain: Small acute infarct of the right posterior lateral medulla.  Moderate chronic microvascular ischemic changes and multiple chronic small vessel infarcts.  Carotid Dopplers: Bilateral 1-39% ICA stenosis.  TTE: LVEF 55-60%.  Please see detailed report as below.  LDL 90.  A1c 7.0.  Not on antithrombotics PTA, now on aspirin 81 mg daily.  Therapies recommend dc to CIR to due to poor safety awareness and to increase safe performance of ADLs and patient is of high fall risk.  CIR consulted.  Tobacco cessation counseled.  Neurology signed off note appreciated.  Recommend outpatient MRA head or CTA head to look for intracranial circulation.  Outpatient follow-up with neurology in 4 weeks.  Await Rehab MD consultation.  Essential hypertension  Allow permissive hypertension (okay if <220/120) due to recent acute stroke and gradually normalize in 5 to 7  days.  Hold lisinopril-HCTZ.  Dyslipidemia  LDL 90, goal <70.  Started atorvastatin 40 mg daily.  Type II DM,  A1c 7.  Appears to be new diagnosis.  Recommend diabetic diet, lifestyle modifications, monitor CBGs closely.  DM coordinator input appreciated, plan to see closer to discharge.  SSI.  Reasonable inpatient control.  Consider Metformin at discharge.  Tobacco abuse  Cessation counseled.  Body mass index is 31.29 kg/m./Obesity   Leukocytosis  Unclear etiology but appears chronic.  Outpatient follow-up.  DVT prophylaxis: Lovenox Code Status: Full Family Communication: None at bedside.  I discussed in detail with patient's daughter, updated care and answered questions. Disposition: Pending determination by rehab MD.   Consultants:  Neurology  Procedures:  TTE 12/21/2018:  IMPRESSIONS    1. Left ventricular ejection fraction, by visual estimation, is 55 to 60%. The left ventricle has normal function. There is no left ventricular hypertrophy.  2. The left ventricle has no regional wall motion abnormalities.  3. Global right ventricle has normal systolic function.The right ventricular size is normal. No increase in right ventricular wall thickness.  4. Left atrial size was normal.  5. Right atrial size was normal.  6. Presence of pericardial fat pad.  7. Small pericardial effusion.  8. The mitral valve is normal in structure. No evidence of mitral valve regurgitation. No evidence of mitral stenosis.  9. The tricuspid valve is normal in structure. Tricuspid valve regurgitation is not demonstrated. 10. The aortic valve is normal in structure. Aortic valve regurgitation is not visualized. No evidence of aortic valve sclerosis or  stenosis. 11. The pulmonic valve was normal in structure. Pulmonic valve regurgitation is not visualized.   Antimicrobials:  None   Subjective: Reports ongoing unsteadiness with physical therapy.  They told me that I am  "wobbly".  Objective:  Vitals:   12/21/18 2319 12/22/18 0410 12/22/18 0850 12/22/18 1203  BP: (!) 150/83 (!) 161/95 (!) 188/88 (!) 197/80  Pulse: 75 77 66 68  Resp: 17 18 15 15   Temp: 98.2 F (36.8 C) 97.8 F (36.6 C) (!) 97.5 F (36.4 C) 97.8 F (36.6 C)  TempSrc: Oral Oral Oral Oral  SpO2: 100% 98% 99% 100%  Weight:      Height:        Examination:  General exam: Pleasant middle-age female, moderately built and obese lying comfortably propped up in bed. Respiratory system: Clear to auscultation. Respiratory effort normal. Cardiovascular system: S1 & S2 heard, RRR. No JVD, murmurs, rubs, gallops or clicks. No pedal edema.  Telemetry personally reviewed: Sinus rhythm with occasional PVCs and trigeminy. Gastrointestinal system: Abdomen is nondistended, soft and nontender. No organomegaly or masses felt. Normal bowel sounds heard. Central nervous system: Alert and oriented. No focal neurological deficits.  No pronator drift. Extremities: Symmetric 5 x 5 power. Skin: No rashes, lesions or ulcers Psychiatry: Judgement and insight appear normal. Mood & affect appropriate.     Data Reviewed: I have personally reviewed following labs and imaging studies   CBC: Recent Labs  Lab 12/20/18 1311  WBC 16.7*  HGB 15.6*  HCT 47.2*  MCV 88.1  PLT [id number]    Basic Metabolic Panel: Recent Labs  Lab 12/20/18 1311  NA 139  K 3.7  CL 99  CO2 27  GLUCOSE 145*  BUN 8  CREATININE 0.97  CALCIUM 9.8    Liver Function Tests: Recent Labs  Lab 12/20/18 1411  AST 15  ALT 10  ALKPHOS 80  BILITOT 0.6  PROT 8.1  ALBUMIN 4.1    CBG: Recent Labs  Lab 12/20/18 1321 12/21/18 2124 12/22/18 0636 12/22/18 1216  GLUCAP 151* 107* 149* 100*    Recent Results (from the past 240 hour(s))  SARS CORONAVIRUS 2 (TAT 6-24 HRS) Nasopharyngeal Nasopharyngeal Swab     Status: None   Collection Time: 12/20/18  3:37 PM   Specimen: Nasopharyngeal Swab  Result Value Ref Range Status   SARS  Coronavirus 2 NEGATIVE NEGATIVE Final    Comment: (NOTE) SARS-CoV-2 target nucleic acids are NOT DETECTED. The SARS-CoV-2 RNA is generally detectable in upper and lower respiratory specimens during the acute phase of infection. Negative results do not preclude SARS-CoV-2 infection, do not rule out co-infections with other pathogens, and should not be used as the sole basis for treatment or other patient management decisions. Negative results must be combined with clinical observations, patient history, and epidemiological information. The expected result is Negative. Fact Sheet for Patients: SugarRoll.be Fact Sheet for Healthcare Providers: https://www.woods-mathews.com/ This test is not yet approved or cleared by the [place] FDA and  has been authorized for detection and/or diagnosis of SARS-CoV-2 by FDA under an Emergency Use Authorization (EUA). This EUA will remain  in effect (meaning this test can be used) for the duration of the COVID-19 declaration under Section 56 4(b)(1) of the Act, 21 U.S.C. section 360bbb-3(b)(1), unless the authorization is terminated or revoked sooner. Performed at [facility], [place] [address] [place], [place] [address]       Radiology Studies: Vas US Carotid (at [place] Only)  Result Date:  12/21/2018 Carotid Arterial Duplex Study Indications:       CVA. Risk Factors:      Hypertension. Other Factors:     Infarct of the right posterolateral medulla. Comparison Study:  no prior Performing Technologist: June Leap RDMS, RVT  Examination Guidelines: A complete evaluation includes B-mode imaging, spectral Doppler, color Doppler, and power Doppler as needed of all accessible portions of each vessel. Bilateral testing is considered an integral part of a complete examination. Limited examinations for reoccurring indications may be performed as noted.  Right Carotid Findings:  +----------+--------+--------+--------+------------------+--------+             PSV cm/s EDV cm/s Stenosis Plaque Description Comments  +----------+--------+--------+--------+------------------+--------+  CCA Prox   71       14                                             +----------+--------+--------+--------+------------------+--------+  CCA Distal 67       13                                             +----------+--------+--------+--------+------------------+--------+  ICA Prox   54       16       1-39%    heterogenous                 +----------+--------+--------+--------+------------------+--------+  ICA Distal 63       15                                             +----------+--------+--------+--------+------------------+--------+  ECA        102      15                heterogenous                 +----------+--------+--------+--------+------------------+--------+ +----------+--------+-------+--------+-------------------+             PSV cm/s EDV cms Describe Arm Pressure (mmHG)  +----------+--------+-------+--------+-------------------+  Subclavian 320              Stenotic                      +----------+--------+-------+--------+-------------------+ +---------+--------+--+--------+-+---------+  Vertebral PSV cm/s 40 EDV cm/s 6 Antegrade  +---------+--------+--+--------+-+---------+  Left Carotid Findings: +----------+--------+--------+--------+------------------+--------+             PSV cm/s EDV cm/s Stenosis Plaque Description Comments  +----------+--------+--------+--------+------------------+--------+  CCA Prox   89       14                                             +----------+--------+--------+--------+------------------+--------+  CCA Distal 74       18                                             +----------+--------+--------+--------+------------------+--------+  ICA Prox   38  14       1-39%    heterogenous                 +----------+--------+--------+--------+------------------+--------+   ICA Distal 70       21                                             +----------+--------+--------+--------+------------------+--------+  ECA        105      16                heterogenous                 +----------+--------+--------+--------+------------------+--------+ +----------+--------+--------+----------------+-------------------+             PSV cm/s EDV cm/s Describe         Arm Pressure (mmHG)  +----------+--------+--------+----------------+-------------------+  Subclavian 139               Multiphasic, WNL                      +----------+--------+--------+----------------+-------------------+ +---------+--------+--+--------+--+---------+  Vertebral PSV cm/s 52 EDV cm/s 22 Antegrade  +---------+--------+--+--------+--+---------+  Summary: Right Carotid: Velocities in the right ICA are consistent with a 1-39% stenosis. Left Carotid: Velocities in the left ICA are consistent with a 1-39% stenosis.  *See table(s) above for measurements and observations.     Preliminary           Scheduled Meds:  aspirin EC  81 mg Oral Daily   atorvastatin  40 mg Oral q1800   enoxaparin (LOVENOX) injection  40 mg Subcutaneous Q24H   insulin aspart  0-9 Units Subcutaneous TID WC   nabumetone  500 mg Oral BID   Continuous Infusions:    LOS: 2 days     Vernell Leep, MD, [place], [facility]. [facility]  To contact the attending provider between 7A-7P or the covering provider during after hours 7P-7A, please log into the web site [email or web address] and access using universal [place] password for that web site. If you do not have the password, please call the hospital operator.  12/22/2018, 2:36 PM

## 2018-12-22 NOTE — Progress Notes (Signed)
Physical Therapy Treatment Patient Details Name: Ashley Price MRN: [id number] DOB: July 14, 1955 Today's Date: 12/22/2018    History of Present Illness Ashley Price is a 63 y.o. female with medical history significant of hypertension and osteoarthritis who presents to the emergency department with a chief complaint of dizziness, imbalance, and R sided headache. MRI revealed acute stroke of R posterior medulla.    PT Comments    Patient seen for mobility progression. Pt agreeable to participate in therapy. Pt continues to present with significant balance deficits and decreased safety awareness. Pt with several R lateral LOB and requires assistance to recover and prevent falls.  Pt requires single UE or assistance from therapist for Romberg with eyes open. Continue to recommend CIR for further skilled PT services to maximize independence and safety with mobility.    Follow Up Recommendations  CIR;Supervision/Assistance - 24 hour     Equipment Recommendations  Rolling walker with 5" wheels    Recommendations for Other Services Rehab consult     Precautions / Restrictions Precautions Precautions: Fall Restrictions Weight Bearing Restrictions: No    Mobility  Bed Mobility Overal bed mobility: Modified Independent             General bed mobility comments: increased time and use of rail  Transfers Overall transfer level: Needs assistance Equipment used: Rolling walker (2 wheeled) Transfers: Sit to/from Stand Sit to Stand: Min guard;Min assist         General transfer comment: assistance for safety and balance  Ambulation/Gait Ambulation/Gait assistance: Mod assist Gait Distance (Feet): 150 Feet Assistive device: Rolling walker (2 wheeled) Gait Pattern/deviations: Step-through pattern;Decreased stance time - right;Staggering right;Wide base of support     General Gait Details: pt with R lateral bias and several LOB to R side; cues required for safe use of AD and  for cadence; assistance required to recover from each LOB   Stairs             Wheelchair Mobility    Modified Rankin (Stroke Patients Only) Modified Rankin (Stroke Patients Only) Pre-Morbid Rankin Score: No symptoms Modified Rankin: Moderately severe disability     Balance Overall balance assessment: Needs assistance Sitting-balance support: Feet supported Sitting balance-Leahy Scale: Good     Standing balance support: During functional activity;Single extremity supported;Bilateral upper extremity supported Standing balance-Leahy Scale: Poor           Rhomberg - Eyes Opened: (pt unable to maintain balance without single UE support)   High level balance activites: Backward walking High Level Balance Comments: cues for step lengths and assistance for balance; pt taking very large steps backward with R LE            Cognition Arousal/Alertness: Awake/alert Behavior During Therapy: WFL for tasks assessed/performed Overall Cognitive Status: Impaired/Different from baseline Area of Impairment: Safety/judgement;Problem solving                         Safety/Judgement: Decreased awareness of safety;Decreased awareness of deficits   Problem Solving: Requires verbal cues General Comments: pt reports awareness of impaired balance however is impulsive to mobilize and requires cues throughout for safety      Exercises      General Comments        Pertinent Vitals/Pain Pain Assessment: No/denies pain    Home Living                      Prior Function  PT Goals (current goals can now be found in the care plan section) Acute Rehab PT Goals Patient Stated Goal: go home Progress towards PT goals: Progressing toward goals    Frequency    Min 4X/week      PT Plan Current plan remains appropriate    Co-evaluation              AM-PAC PT "6 Clicks" Mobility   Outcome Measure  Help needed turning from your back to  your side while in a flat bed without using bedrails?: None Help needed moving from lying on your back to sitting on the side of a flat bed without using bedrails?: None Help needed moving to and from a bed to a chair (including a wheelchair)?: A Little Help needed standing up from a chair using your arms (e.g., wheelchair or bedside chair)?: A Little Help needed to walk in hospital room?: A Lot Help needed climbing 3-5 steps with a railing? : A Lot 6 Click Score: 18    End of Session Equipment Utilized During Treatment: Gait belt Activity Tolerance: Patient tolerated treatment well Patient left: in chair;with call bell/phone within reach;with chair alarm set Nurse Communication: Mobility status PT Visit Diagnosis: Unsteadiness on feet (R26.81);Other abnormalities of gait and mobility (R26.89);History of falling (Z91.81);Difficulty in walking, not elsewhere classified (R26.2);Other symptoms and signs involving the nervous system (R29.898)     Time: 1340-1355 PT Time Calculation (min) (ACUTE ONLY): 15 min  Charges:  $Gait Training: 8-22 mins                     Earney Navy, PTA Acute Rehabilitation Services Pager: [phone] Office: [phone]     Darliss Cheney 12/22/2018, 2:07 PM

## 2018-12-22 NOTE — Evaluation (Signed)
Speech Language Pathology Evaluation Patient Details Name: Ashley Price MRN: [id number] DOB: 05-06-1955 Today's Date: 12/22/2018 Time: 1415-1440 SLP Time Calculation (min) (ACUTE ONLY): 25 min  Problem List:  Patient Active Problem List   Diagnosis Date Noted  . Stroke due to thrombosis of right vertebral artery ([place]) 12/20/2018  . Essential hypertension 12/20/2018  . Fall at home, initial encounter 12/20/2018  . Stroke due to embolism of right vertebral artery ([place]) 12/20/2018  . Osteomyelitis ([place]) 08/17/2018  . Cellulitis 08/17/2018  . Hypertensive urgency 08/17/2018  . Osteoarthritis 08/17/2018   Past Medical History:  Past Medical History:  Diagnosis Date  . Hypertension   . Osteoarthritis    Past Surgical History:  Past Surgical History:  Procedure Laterality Date  . AMPUTATION Left 08/18/2018   Procedure: AMPUTATION LEFT 2ND TOE;  Surgeon: Tania Ade, MD;  Location: [place];  Service: Orthopedics;  Laterality: Left;  . TUBAL LIGATION Bilateral    HPI:  Ashley Price is a 63 y.o. female with medical history significant of hypertension and osteoarthritis who presents to the emergency department with a chief complaint of dizziness, imbalance, and R sided headache. MRI revealed acute stroke of R posterior medulla.   Assessment / Plan / Recommendation Clinical Impression  Pt was seen for a cognitive-linguistic evaluation in the setting of a R posterior medulla infarct.  Pt was encountered awake/alert and she was agreeable to ST evaluation.  Pt reported that she has two grown children who live with her and that she is responsible for all ADLs and IADLs at baseline.  Pt is a retired Network engineer.  She completed the [facility] Cognitive Assessment [facility]) in addition to informal evaluation.  Pt exhibited mild difficulty with executive functions and short-term memory; however, she scored 26/30 overall on the [facility] which is WNL (>/=26/30).  Pt was observed to be mildly impulsive  evidenced by attempting to stand up and walk by herself to the bed despite being a moderate fall risk.  Expressive/receptive language is functional and no dysarthria was observed.  No further skilled ST is warranted at this time; however recommend supervision with IADLs (medications, finances, etc.) when pt is first discharged.  Pt was educated regarding all recommendation and she verbalized understanding.  Please re-consult if additional needs arise.      SLP Assessment  SLP Recommendation/Assessment: Patient does not need any further Speech Lanaguage Pathology Services SLP Visit Diagnosis: Cognitive communication deficit (R41.841)    Follow Up Recommendations       Frequency and Duration           SLP Evaluation Cognition  Overall Cognitive Status: Impaired/Different from baseline Arousal/Alertness: Awake/alert Orientation Level: Oriented X4 Attention: Sustained Sustained Attention: Appears intact Memory: Impaired Memory Impairment: Retrieval deficit;Decreased short term memory Decreased Short Term Memory: Verbal complex Awareness: Appears intact Problem Solving: Appears intact Executive Function: Organizing;Reasoning Reasoning: Appears intact Organizing: Impaired Organizing Impairment: Functional complex Safety/Judgment: Impaired       Comprehension  Auditory Comprehension Overall Auditory Comprehension: Appears within functional limits for tasks assessed    Expression Expression Primary Mode of Expression: Verbal Verbal Expression Overall Verbal Expression: Appears within functional limits for tasks assessed Written Expression Dominant Hand: Right   Oral / Motor  Oral Motor/Sensory Function Overall Oral Motor/Sensory Function: Within functional limits Motor Speech Overall Motor Speech: Appears within functional limits for tasks assessed   GO                   Colin Mulders., M.S., [id number]  Acute Rehabilitation Services Office: [phone]  [place] 12/22/2018, 2:55 PM

## 2018-12-22 NOTE — Progress Notes (Signed)
Inpatient Diabetes Program Recommendations  AACE/ADA: New Consensus Statement on Inpatient Glycemic Control (2015)  Target Ranges:  Prepandial:   less than 140 mg/dL      Peak postprandial:   less than 180 mg/dL (1-2 hours)      Critically ill patients:  140 - 180 mg/dL   Lab Results  Component Value Date   GLUCAP 149 (H) 12/22/2018   HGBA1C 7.0 (H) 12/21/2018    Review of Glycemic Control  Consult for new diabetes diagnosis  Diabetes history: New diagnosis this admission  Current orders for Inpatient glycemic control: Novolog 0-9 units tid  A1c 7%  Inpatient Diabetes Program Recommendations:    Will order the living well with diabetes booklet for pt review. Noted CIR is being recommended for pt. Will see pt this admission and will continue to follow through CIR and touch base closer to d/c as well if needed.  Note Diabetes Coordinator is not on campus over the weekend but available by pager from 8am to 5pm for questions or concerns.Chart reviewed.   Thanks,  Tama Headings RN, MSN, BC-ADM Inpatient Diabetes Coordinator Team Pager [phone] (8a-5p)

## 2018-12-23 DIAGNOSIS — E1169 Type 2 diabetes mellitus with other specified complication: Secondary | ICD-10-CM

## 2018-12-23 DIAGNOSIS — I639 Cerebral infarction, unspecified: Secondary | ICD-10-CM | POA: Diagnosis present

## 2018-12-23 DIAGNOSIS — I63011 Cerebral infarction due to thrombosis of right vertebral artery: Secondary | ICD-10-CM

## 2018-12-23 LAB — GLUCOSE, CAPILLARY
Glucose-Capillary: 124 mg/dL — ABNORMAL HIGH (ref 70–99)
Glucose-Capillary: 108 mg/dL — ABNORMAL HIGH (ref 70–99)

## 2018-12-23 MED ORDER — BLOOD GLUCOSE MONITOR KIT: PACK | 0 refills | Status: DC

## 2018-12-23 MED ORDER — METFORMIN HCL 500 MG PO TABS: 500.0000 mg | ORAL_TABLET | Freq: Every day | ORAL | 0 refills | Status: DC

## 2018-12-23 MED ORDER — ATORVASTATIN CALCIUM 40 MG PO TABS: 40.0000 mg | ORAL_TABLET | Freq: Every day | ORAL | 0 refills | Status: DC

## 2018-12-23 MED ORDER — ASPIRIN 81 MG PO TBEC: 81.0000 mg | DELAYED_RELEASE_TABLET | Freq: Every day | ORAL | 0 refills | Status: DC

## 2018-12-23 NOTE — Progress Notes (Addendum)
Inpatient Diabetes Program Recommendations  AACE/ADA: New Consensus Statement on Inpatient Glycemic Control (2015)  Target Ranges:  Prepandial:   less than 140 mg/dL      Peak postprandial:   less than 180 mg/dL (1-2 hours)      Critically ill patients:  140 - 180 mg/dL   Results for Ashley Price, Ashley Price (MRN [id number]) as of 12/23/2018 12:30  Ref. Range 12/22/2018 06:36 12/22/2018 12:16 12/22/2018 15:57 12/22/2018 21:39  Glucose-Capillary Latest Ref Range: 70 - 99 mg/dL 149 (H) 100 (H) 127 (H) 115 (H)   Results for Ashley Price, Ashley Price (MRN [id number]) as of 12/23/2018 12:30  Ref. Range 12/23/2018 06:25 12/23/2018 11:08  Glucose-Capillary Latest Ref Range: 70 - 99 mg/dL 124 (H) 108 (H)   Results for Ashley Price, Ashley Price (MRN [id number]) as of 12/23/2018 12:30  Ref. Range 12/21/2018 03:15  Hemoglobin A1C Latest Ref Range: 4.8 - 5.6 % 7.0 (H)  (154 mg/dl)    Admit with: CVA/ New Diagnosis of Diabetes   Current Orders: Novolog Sensitive Correction Scale/ SSI (0-9 units) TID AC     Recommending CIR after discharge.  Retired from [employer] 2011--lives with adult children per MD notes.    MD- Pt requiring very little Novolog insulin in hospital.  A1c of 7%--Recommend Metformin 500 mg BID for home  Patient will need CBG meter for home use as well Order [id number]  Will speak w/ pt today about her new diagnosis.    Addendum 12:50pm- Spoke with pt about new diagnosis.  Discussed A1C results with her and explained what an A1C is, basic pathophysiology of DM Type 2, basic home care, basic diabetes diet nutrition principles, importance of checking CBGs and maintaining good CBG control to prevent long-term and short-term complications.  Reviewed signs and symptoms of hyperglycemia and hypoglycemia and how to treat hypoglycemia at home.  Also reviewed blood sugar goals and A1c goals for home.    RNs to provide ongoing basic DM education at bedside with this patient.  Have ordered educational  booklet, CBG meter teaching, and RD consult for DM diet education for this patient.  Encouraged pt to follow up with her PCP Dr. Nancy Fetter shortly after d/c and alert her PCP to her CVA diagnosis and new diabetes diagnosis.  Encouraged pt to check her CBGs at least TID ac meals for the 1st few weeks after returning home and then can reduce her CBG checks to BID--recommend 1st thing in the AM and vary the 2nd check of the day either before a meal or 2 hours after a meal.     --Will follow patient during hospitalization--  Wyn Quaker RN, MSN, CDE Diabetes Coordinator Inpatient Glycemic Control Team Team Pager: [phone] (8a-5p)

## 2018-12-23 NOTE — Consult Note (Signed)
Physical Medicine and Rehabilitation Consult Reason for Consult: Decreased functional mobility Referring Physician: [facility]   HPI: Ashley Price is a 63 y.o. right-handed female with history of hypertension, tobacco abuse as well as amputation left second toe.  Per chart review she lives with her children.  Independent prior to admission uses a Rollator occasionally due to chronic bilateral knee pain.  Two-level home with bed and bath on main level and one-step to entry.  Presented 12/20/2018 with dizziness, nausea, right side headache and poor balance with fall.  SARS coronavirus negative, elevated hemoglobin A1c 7.0, WBC 16,700, alcohol negative, urine drug screen pending, urinalysis pending.  MRI of the brain showed small acute infarct of the right posterior lateral medulla.  Patient did not receive TPA.  Echocardiogram with ejection fraction of 60% without emboli.  Carotid Dopplers with no ICA stenosis.  Neurology service follow-up placed on aspirin for CVA prophylaxis recommendations for outpatient MRA of the head.  Subcutaneous Lovenox for DVT prophylaxis.  Therapy evaluation completed with recommendations of physical medicine rehab consult.  Pt amb with PT this am with supervision No longer has dizziness, N/V No numbness, no problem with swallow, no facial pain, no hoarseness  Review of Systems  Constitutional: Negative for fever.  HENT: Negative for hearing loss.   Eyes: Negative for blurred vision and double vision.  Respiratory: Negative for cough and shortness of breath.   Cardiovascular: Negative for chest pain and palpitations.  Gastrointestinal: Positive for nausea.  Genitourinary: Negative for dysuria, flank pain and hematuria.  Musculoskeletal: Positive for myalgias.  Skin: Negative for rash.  Neurological: Positive for dizziness and headaches.  All other systems reviewed and are negative.  Past Medical History:  Diagnosis Date  . Hypertension   . Osteoarthritis     Past Surgical History:  Procedure Laterality Date  . AMPUTATION Left 08/18/2018   Procedure: AMPUTATION LEFT 2ND TOE;  Surgeon: Tania Ade, MD;  Location: [facility];  Service: Orthopedics;  Laterality: Left;  . TUBAL LIGATION Bilateral    Family History  Problem Relation Age of Onset  . Breast cancer Mother   . Kidney failure Brother    Social History:  reports that she has been smoking cigarettes. She has never used smokeless tobacco. She reports that she does not drink alcohol or use drugs. Allergies: No Known Allergies Medications Prior to Admission  Medication Sig Dispense Refill  . diclofenac sodium (VOLTAREN) 1 % GEL Apply 2 g topically 4 (four) times daily. 1 Tube 0  . lisinopril-hydrochlorothiazide (PRINZIDE,ZESTORETIC) 20-12.5 MG tablet Take 1 tablet by mouth daily.    . nabumetone (RELAFEN) 500 MG tablet Take 500 mg by mouth 2 (two) times daily.    Marland Kitchen ibuprofen (ADVIL,MOTRIN) 800 MG tablet Take 1 tablet (800 mg total) by mouth 3 (three) times daily. (Patient not taking: Reported on 12/20/2018) 21 tablet 0    Home: Home Living Family/patient expects to be discharged to:: Private residence Living Arrangements: Children Available Help at Discharge: Family Type of Home: House Home Access: Stairs to enter Technical brewer of Steps: 1 Home Layout: Two level, Able to live on main level with bedroom/bathroom Bathroom Shower/Tub: Multimedia programmer: [place]: Environmental consultant - 4 wheels, [employer] - single point, Civil engineer, contracting, Wheelchair - manual  Lives With: Family  Functional History: Prior Function Level of Independence: Independent Comments: Pt reports independence with all BADLs and IADLs. Functional Status:  Mobility: Bed Mobility Overal bed mobility: Modified Independent General bed mobility comments:  increased time and use of rail Transfers Overall transfer level: Needs assistance Equipment used: Rolling walker (2 wheeled) Transfers: Sit  to/from Stand Sit to Stand: Min guard, Min assist General transfer comment: assistance for safety and balance Ambulation/Gait Ambulation/Gait assistance: Mod assist Gait Distance (Feet): 150 Feet Assistive device: Rolling walker (2 wheeled) Gait Pattern/deviations: Step-through pattern, Decreased stance time - right, Staggering right, Wide base of support General Gait Details: pt with R lateral bias and several LOB to R side; cues required for safe use of AD and for cadence; assistance required to recover from each LOB    ADL: ADL Overall ADL's : Needs assistance/impaired Eating/Feeding: Sitting, Independent Grooming: Oral care, Wash/dry face, Min guard, Minimal assistance, Standing Grooming Details (indicate cue type and reason): Pt performed oral care and face washing standing at sink. Pt required min guard A- min A for balance and stability. Pt had episodes of LOB standing at sink, requiring min A to correct. Pt with slow movements and requiring increased time to perform oral care.   Upper Body Bathing: Min guard, Sitting Lower Body Bathing: Sit to/from stand, Min guard Upper Body Dressing : Min guard, Sitting Lower Body Dressing: Sit to/from stand, Min guard Lower Body Dressing Details (indicate cue type and reason): Pt performed figure 4 method for donning socks and shoes. Pt requiring min guard A for safety and balance during sit <>stand Toilet Transfer: Minimal assistance, Ambulation, RW Toilet Transfer Details (indicate cue type and reason): Pt required min A for balance during functional mobility  Toileting- Clothing Manipulation and Hygiene: Min guard, Sit to/from stand, Minimal assistance Functional mobility during ADLs: Minimal assistance, Rolling walker General ADL Comments: Pt performing all BADLs with min guard A- min A for safety and balance. Focused session on oral care standing at sink, had episodes of LOB requiring min A to correct.   Cognition: Cognition Overall  Cognitive Status: Impaired/Different from baseline Arousal/Alertness: Awake/alert Orientation Level: Oriented X4 Attention: Sustained Sustained Attention: Appears intact Memory: Impaired Memory Impairment: Retrieval deficit, Decreased short term memory Decreased Short Term Memory: Verbal complex Awareness: Appears intact Problem Solving: Appears intact Executive Function: Organizing, Reasoning Reasoning: Appears intact Organizing: Impaired Organizing Impairment: Functional complex Safety/Judgment: Impaired Cognition Arousal/Alertness: Awake/alert Behavior During Therapy: WFL for tasks assessed/performed Overall Cognitive Status: Impaired/Different from baseline Area of Impairment: Safety/judgement, Problem solving Memory: Decreased short-term memory Following Commands: Follows one step commands inconsistently, Follows one step commands with increased time Safety/Judgement: Decreased awareness of safety, Decreased awareness of deficits Awareness: Intellectual Problem Solving: Requires verbal cues General Comments: pt reports awareness of impaired balance however is impulsive to mobilize and requires cues throughout for safety  Blood pressure (!) 174/86, pulse 65, temperature 97.9 F (36.6 C), temperature source Oral, resp. rate 17, height 5' 3.5" (1.613 m), weight 81.4 kg, SpO2 96 %. Physical Exam  Neurological:  Patient is alert no acute distress follows commands fair awareness of deficits.   General: No acute distress Mood and affect are appropriate Heart: Regular rate and rhythm no rubs murmurs or extra sounds Lungs: Clear to auscultation, breathing unlabored, no rales or wheezes Abdomen: Positive bowel sounds, soft nontender to palpation, nondistended Extremities: No clubbing, cyanosis, or edema Skin: No evidence of breakdown, no evidence of rash Neurologic: Cranial nerves II through XII intact, motor strength is 5/5 in bilateral deltoid, bicep, tricep, grip, hip flexor,  knee extensors, ankle dorsiflexor and plantar flexor Sensory exam normal sensation to light touch a in bilateral upper and lower extremities Cerebellar exam normal finger to  nose to finger as well as heel to shin in bilateral upper and lower extremities Musculoskeletal: Full range of motion in all 4 extremities. No joint swelling Sitting balance leans mildly to the left Standing balance is fair amb with walker, sup , some LOB with turns Results for orders placed or performed during the hospital encounter of 12/20/18 (from the past 24 hour(s))  Glucose, capillary     Status: Abnormal   Collection Time: 12/22/18  6:36 AM  Result Value Ref Range   Glucose-Capillary 149 (H) 70 - 99 mg/dL  Glucose, capillary     Status: Abnormal   Collection Time: 12/22/18 12:16 PM  Result Value Ref Range   Glucose-Capillary 100 (H) 70 - 99 mg/dL  Glucose, capillary     Status: Abnormal   Collection Time: 12/22/18  3:57 PM  Result Value Ref Range   Glucose-Capillary 127 (H) 70 - 99 mg/dL  Glucose, capillary     Status: Abnormal   Collection Time: 12/22/18  9:39 PM  Result Value Ref Range   Glucose-Capillary 115 (H) 70 - 99 mg/dL   Comment 1 Notify RN    Comment 2 Document in Chart    Vas US Carotid (at [place] Only)  Result Date: 12/21/2018 Carotid Arterial Duplex Study Indications:       CVA. Risk Factors:      Hypertension. Other Factors:     Infarct of the right posterolateral medulla. Comparison Study:  no prior Performing Technologist: June Leap RDMS, RVT  Examination Guidelines: A complete evaluation includes B-mode imaging, spectral Doppler, color Doppler, and power Doppler as needed of all accessible portions of each vessel. Bilateral testing is considered an integral part of a complete examination. Limited examinations for reoccurring indications may be performed as noted.  Right Carotid Findings: +----------+--------+--------+--------+------------------+--------+           PSV cm/sEDV  cm/sStenosisPlaque DescriptionComments +----------+--------+--------+--------+------------------+--------+ CCA Prox  71      14                                         +----------+--------+--------+--------+------------------+--------+ CCA Distal67      13                                         +----------+--------+--------+--------+------------------+--------+ ICA Prox  54      16      1-39%   heterogenous               +----------+--------+--------+--------+------------------+--------+ ICA Distal63      15                                         +----------+--------+--------+--------+------------------+--------+ ECA       102     15              heterogenous               +----------+--------+--------+--------+------------------+--------+ +----------+--------+-------+--------+-------------------+           PSV cm/sEDV cmsDescribeArm Pressure (mmHG) +----------+--------+-------+--------+-------------------+ Subclavian320            Stenotic                    +----------+--------+-------+--------+-------------------+ +---------+--------+--+--------+-+---------+  VertebralPSV cm/s40EDV cm/s6Antegrade +---------+--------+--+--------+-+---------+  Left Carotid Findings: +----------+--------+--------+--------+------------------+--------+           PSV cm/sEDV cm/sStenosisPlaque DescriptionComments +----------+--------+--------+--------+------------------+--------+ CCA Prox  89      14                                         +----------+--------+--------+--------+------------------+--------+ CCA Distal74      18                                         +----------+--------+--------+--------+------------------+--------+ ICA Prox  38      14      1-39%   heterogenous               +----------+--------+--------+--------+------------------+--------+ ICA Distal70      21                                          +----------+--------+--------+--------+------------------+--------+ ECA       105     16              heterogenous               +----------+--------+--------+--------+------------------+--------+ +----------+--------+--------+----------------+-------------------+           PSV cm/sEDV cm/sDescribe        Arm Pressure (mmHG) +----------+--------+--------+----------------+-------------------+ [id number]             Multiphasic, WNL                    +----------+--------+--------+----------------+-------------------+ +---------+--------+--+--------+--+---------+ VertebralPSV cm/s52EDV cm/s22Antegrade +---------+--------+--+--------+--+---------+  Summary: Right Carotid: Velocities in the right ICA are consistent with a 1-39% stenosis. Left Carotid: Velocities in the left ICA are consistent with a 1-39% stenosis.  *See table(s) above for measurements and observations.     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 Does the need for close, 24 hr/day medical supervision in concert with the patient's rehab needs make it unreasonable for this patient to be served in a less intensive setting? No 2. Co-Morbidities requiring supervision/potential complications: new onset diabetes 3. Due to safety, does the patient require 24 hr/day rehab nursing? No 4. Does the patient require coordinated care of a physician, rehab nurse, therapy disciplines of PT, OT to address physical and functional deficits in the context of the above medical diagnosis(es)? No Addressing deficits in the following areas: balance 5. Can the patient actively participate in an intensive therapy program of at least 3 hrs of therapy per day at least 5 days per week? Yes 6. The potential for patient to make measurable gains while on inpatient rehab is limited given high functional status 7. Anticipated functional outcomes upon discharge from inpatient rehab  are n/a  with PT, n/a with OT, n/a with SLP. 8. Estimated rehab length of stay to reach the above functional goals is: NA 9. Anticipated discharge destination: Home 10. Overall Rehab/Functional Prognosis: excellent  RECOMMENDATIONS: This patient's condition is appropriate for continued rehabilitative care in the following setting: [facility] Therapy Patient has agreed to participate in recommended program. Yes Note that insurance prior authorization may be required for reimbursement for recommended care.  Comment: already at supervision level, may  go home with family support of 4 children , has RW, no outpt rehab f/u needed  "I have personally performed a face to face diagnostic evaluation of this patient.  Additionally, I have reviewed and concur with the physician assistant's documentation above."  Charlett Blake M.D. [facility] FAAPM&R (Sports Med, Neuromuscular Med) Diplomate Am Board of Electrodiagnostic Med  Ashley Price 12/23/2018

## 2018-12-23 NOTE — Plan of Care (Signed)
Patient's respiration rate remains in normal limits even while ambulating in the room.

## 2018-12-23 NOTE — TOC Transition Note (Signed)
Transition of Care [facility]) - CM/SW Discharge Note   Patient Details  Name: ZHAMIRA RAJSKI MRN: [id number] Date of Birth: Aug 21, 1955  [facility] of [facility]) CM/SW Contact:  Pollie Friar, RN Phone Number: 12/23/2018, 1:59 PM   Clinical Narrative:    Pt discharging home with home health services through [facility].  Brit[facility] with [facility] accepted the referral.  DME to be delivered to the room prior to d/c.  Pt has supervision at home and transportation to home.   Final next level of care: Home w Home Health Services Barriers to Discharge: No Barriers Identified   Patient Goals and CMS Choice   CMS Medicare.gov Compare Post Acute Care list provided to:: Patient Choice offered to / list presented to : Patient  Discharge Placement                       Discharge Plan and Services                DME Arranged: 3-N-1, Walker rolling DME Agency: [employer] Date DME Agency Contacted: 12/23/18   Representative spoke with at DME Agency: [place]: PT, OT, Speech Therapy HH Agency: [facility] Date [place]: 12/23/18   Representative spoke with at [place]: [place] ([place]) Interventions     Readmission Risk Interventions No flowsheet data found.

## 2018-12-23 NOTE — Discharge Summary (Signed)
Physician Discharge Summary  Ashley Price [id number] DOB: 1955/04/30  PCP: Donald Prose, MD  Admitted from: Home Discharged to: Home  Admit date: 12/20/2018 Discharge date: 12/23/2018  Recommendations for Outpatient Follow-up:   Follow-up Information    Donald Prose, MD. Schedule an appointment as soon as possible for a visit in 1 week(s).   Specialty: Family Medicine Why: To be seen with repeat labs (CBC & BMP).  Kindly assist with further management of newly diagnosed type 2 diabetes mellitus. Contact information: [place] [address] [facility]. Schedule an appointment as soon as possible for a visit in 2 week(s).   Why: Hospital follow-up. Contact information: [place] [address]  Phone number: [place]: PT, OT and SLP Equipment/Devices: None  Discharge Condition: Improved and stable CODE STATUS: DNR Diet recommendation: Heart healthy & diabetic diet.  Discharge Diagnoses:  Principal Problem:   Acute ischemic stroke 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  Admitted for acute stroke.  Neurology consulted.    Assessment & Plan:  Acute stroke  Resultant dizziness, resolved.  No CT head performed.  MRI of brain: Small acute infarct of the right posterior lateral medulla.  Moderate chronic microvascular ischemic changes and multiple chronic small vessel infarcts.  Carotid Dopplers: Bilateral  1-39% ICA stenosis.  TTE: LVEF 55-60%.  Please see detailed report as below.  LDL 90.  A1c 7.0.  Not on antithrombotics PTA, now on aspirin 81 mg daily.  Therapies recommend CIR consultation.  Rehab MD consultation and communication appreciated and indicate that she is already at supervision level and recommend discharging home with family support of 4 children, has a rolling walker, no outpatient rehab follow-up needed.  As per discussion with case management, PT, OT and SLP ordered for home.  Tobacco cessation counseled.  Neurology signed off note appreciated.  Recommend outpatient MRA head or CTA head to look for intracranial circulation which can be pursued either by PCP or neurology.  Outpatient follow-up with neurology in 2 weeks.  Ambulatory referral sent to [facility].  Improved..  Essential hypertension  Allowed permissive hypertension (okay if <220/120) due to recent acute stroke and gradually normalize in 5 to 7 days.  Held lisinopril-HCTZ while hospitalized, resume at discharge.  Orthostatic blood pressure checks yesterday negative.  Dyslipidemia  LDL 90, goal <70.  Started atorvastatin 40 mg daily.  Periodically follow LFTs.  Type II DM,  A1c 7.  Newly diagnosed DM.  Recommend diabetic diet, lifestyle modifications, monitor CBGs closely.  DM coordinator input appreciated.  SSI.  Reasonable inpatient control.    Recommend starting with low-dose Metformin 500 mg daily with breakfast and then titrating up based on her tolerance.  She wishes to start right away and follow-up with her PCP.  Tobacco abuse  Cessation counseled.  Body mass index is 31.29 kg/m./Obesity   Leukocytosis  Unclear etiology but appears chronic.  Outpatient follow-up.  Consultants:  Neurology  Procedures:  TTE 12/21/2018:  IMPRESSIONS   1. Left ventricular ejection fraction, by visual estimation, is 55 to 60%. The left ventricle has normal  function. There is no left ventricular hypertrophy. 2. The left ventricle has no regional wall motion abnormalities. 3. Global right ventricle has normal systolic function.The right ventricular size is normal. No increase in right ventricular wall thickness. 4. Left atrial size was normal. 5. Right atrial size was normal. 6. Presence of pericardial fat pad. 7. Small pericardial effusion. 8. The mitral valve is normal in structure. No evidence of mitral valve regurgitation. No evidence of mitral stenosis. 9. The tricuspid valve is normal in structure. Tricuspid valve regurgitation is not demonstrated. 10. The aortic valve is normal in structure. Aortic valve regurgitation is not visualized. No evidence of aortic valve sclerosis or stenosis. 11. The pulmonic valve was normal in structure. Pulmonic valve regurgitation is not visualized.    Discharge Instructions  Discharge Instructions    Ambulatory referral to Neurology   Complete by: As directed    An appointment is requested in approximately: 2 weeks   Call MD for:   Complete by: As directed    Recurrent strokelike symptoms.   Diet - low sodium heart healthy   Complete by: As directed    Diet Carb Modified   Complete by: As directed    Increase activity slowly   Complete by: As directed        Medication List    STOP taking these medications   ibuprofen 800 MG tablet Commonly known as: ADVIL     TAKE these medications   aspirin 81 MG EC tablet Take 1 tablet (81 mg total) by mouth daily. Start taking on: December 24, 2018   atorvastatin 40 MG tablet Commonly known as: LIPITOR Take 1 tablet (40 mg total) by mouth daily at 6 PM.   blood glucose meter kit and supplies Kit Dispense based on patient and insurance preference. Use up to four times daily as directed. (FOR ICD-9 250.00, 250.01).   diclofenac sodium 1 % Gel Commonly known as: VOLTAREN Apply 2 g topically 4 (four) times daily.    lisinopril-hydrochlorothiazide 20-12.5 MG tablet Commonly known as: ZESTORETIC Take 1 tablet by mouth daily.   metFORMIN 500 MG tablet Commonly known as: Glucophage Take 1 tablet (500 mg total) by mouth daily with breakfast.   nabumetone 500 MG tablet Commonly known as: RELAFEN Take 500 mg by mouth 2 (two) times daily.      No Known Allergies    Procedures/Studies: Mr Brain Wo Contrast  Result Date: 12/20/2018 CLINICAL DATA:  Nausea and vomiting, dizziness EXAM: MRI HEAD WITHOUT CONTRAST TECHNIQUE: Multiplanar, multiecho pulse sequences of the brain and surrounding structures were obtained without intravenous contrast. COMPARISON:  None. FINDINGS: Brain: Small focus of reduced diffusion is present at the right posterolateral aspect of the medulla. There are chronic small vessel infarcts of the centrum semiovale and corona radiata, basal ganglia, thalamus, pons, and cerebellum. Additional patchy and confluent areas of T2 hyperintensity in the supratentorial white matter are nonspecific but probably reflect moderate chronic microvascular ischemic changes. There is ex vacuo dilatation of the lateral ventricles. There is no intracranial mass, mass effect, or edema. There is no hydrocephalus or extra-axial fluid collection. Vascular: Major vessel flow voids at the skull base are preserved. Skull and upper cervical spine: Normal marrow signal is preserved. Sinuses/Orbits: Paranasal sinuses are aerated. Orbits are unremarkable. Other: Sella is unremarkable.  Mastoid air cells are clear. IMPRESSION: Small acute infarct of  the right posterolateral medulla. Moderate chronic microvascular ischemic changes and multiple chronic small vessel infarcts. Electronically Signed   By: Macy Mis M.D.   On: 12/20/2018 13:26   Vas US Carotid (at [place] Only)  Result Date: 12/23/2018 Carotid Arterial Duplex Study Indications:       CVA. Risk Factors:      Hypertension. Other Factors:     Infarct of the  right posterolateral medulla. Comparison Study:  no prior Performing Technologist: June Leap RDMS, RVT  Examination Guidelines: A complete evaluation includes B-mode imaging, spectral Doppler, color Doppler, and power Doppler as needed of all accessible portions of each vessel. Bilateral testing is considered an integral part of a complete examination. Limited examinations for reoccurring indications may be performed as noted.  Right Carotid Findings: +----------+--------+--------+--------+------------------+--------+           PSV cm/sEDV cm/sStenosisPlaque DescriptionComments +----------+--------+--------+--------+------------------+--------+ CCA Prox  71      14                                         +----------+--------+--------+--------+------------------+--------+ CCA Distal67      13                                         +----------+--------+--------+--------+------------------+--------+ ICA Prox  54      16      1-39%   heterogenous               +----------+--------+--------+--------+------------------+--------+ ICA Distal63      15                                         +----------+--------+--------+--------+------------------+--------+ ECA       102     15              heterogenous               +----------+--------+--------+--------+------------------+--------+ +----------+--------+-------+--------+-------------------+           PSV cm/sEDV cmsDescribeArm Pressure (mmHG) +----------+--------+-------+--------+-------------------+ Subclavian320            Stenotic                    +----------+--------+-------+--------+-------------------+ +---------+--------+--+--------+-+---------+ VertebralPSV cm/s40EDV cm/s6Antegrade +---------+--------+--+--------+-+---------+  Left Carotid Findings: +----------+--------+--------+--------+------------------+--------+           PSV cm/sEDV cm/sStenosisPlaque DescriptionComments  +----------+--------+--------+--------+------------------+--------+ CCA Prox  89      14                                         +----------+--------+--------+--------+------------------+--------+ CCA Distal74      18                                         +----------+--------+--------+--------+------------------+--------+ ICA Prox  38      14      1-39%   heterogenous               +----------+--------+--------+--------+------------------+--------+ ICA Distal70      21                                         +----------+--------+--------+--------+------------------+--------+  ECA       105     16              heterogenous               +----------+--------+--------+--------+------------------+--------+ +----------+--------+--------+----------------+-------------------+           PSV cm/sEDV cm/sDescribe        Arm Pressure (mmHG) +----------+--------+--------+----------------+-------------------+ [id number]             Multiphasic, WNL                    +----------+--------+--------+----------------+-------------------+ +---------+--------+--+--------+--+---------+ VertebralPSV cm/s52EDV cm/s22Antegrade +---------+--------+--+--------+--+---------+  Summary: Right Carotid: Velocities in the right ICA are consistent with a 1-39% stenosis. Left Carotid: Velocities in the left ICA are consistent with a 1-39% stenosis. Vertebrals:  Bilateral vertebral arteries demonstrate antegrade flow. Subclavians: Right subclavian artery was stenotic. Normal flow hemodynamics were              seen in the left subclavian artery. *See table(s) above for measurements and observations.  Electronically signed by Antony Contras MD on 12/23/2018 at 12:41:00 PM.    Final       Subjective: Patient denies complaints.  Denies dizziness.  States that she has been ambulating more steadily.  Anxious to go home.  Discharge Exam:  Vitals:   12/22/18 2332 12/23/18 0345 12/23/18 0857  12/23/18 1141  BP: (!) 162/76 (!) 174/86 (!) 179/96 (!) 181/90  Pulse: 71 65 63 62  Resp: '16 17 19 16  '$ Temp: 98.5 F (36.9 C) 97.9 F (36.6 C) 97.9 F (36.6 C) 98 F (36.7 C)  TempSrc: Oral Oral Oral Oral  SpO2: 96% 96% 96% 100%  Weight:      Height:        General exam: Pleasant middle-age female, moderately built and obese lying comfortably propped up in bed. Respiratory system: Clear to auscultation. Respiratory effort normal. Cardiovascular system: S1 & S2 heard, RRR. No JVD, murmurs, rubs, gallops or clicks. No pedal edema.  Telemetry personally reviewed: Sinus rhythm with occasional PVCs and trigeminy. Gastrointestinal system: Abdomen is nondistended, soft and nontender. No organomegaly or masses felt. Normal bowel sounds heard. Central nervous system: Alert and oriented. No focal neurological deficits.  No pronator drift. Extremities: Symmetric 5 x 5 power. Skin: No rashes, lesions or ulcers Psychiatry: Judgement and insight appear normal. Mood & affect appropriate.     The results of significant diagnostics from this hospitalization (including imaging, microbiology, ancillary and laboratory) are listed below for reference.     Microbiology: Recent Results (from the past 240 hour(s))  SARS CORONAVIRUS 2 (TAT 6-24 HRS) Nasopharyngeal Nasopharyngeal Swab     Status: None   Collection Time: 12/20/18  3:37 PM   Specimen: Nasopharyngeal Swab  Result Value Ref Range Status   SARS Coronavirus 2 NEGATIVE NEGATIVE Final    Comment: (NOTE) SARS-CoV-2 target nucleic acids are NOT DETECTED. The SARS-CoV-2 RNA is generally detectable in upper and lower respiratory specimens during the acute phase of infection. Negative results do not preclude SARS-CoV-2 infection, do not rule out co-infections with other pathogens, and should not be used as the sole basis for treatment or other patient management decisions. Negative results must be combined with clinical observations, patient  history, and epidemiological information. The expected result is Negative. Fact Sheet for Patients: SugarRoll.be Fact Sheet for Healthcare Providers: https://www.woods-mathews.com/ This test is not yet approved or cleared by the [place] FDA and  has been authorized for detection and/or diagnosis  of SARS-CoV-2 by FDA under an Emergency Use Authorization (EUA). This EUA will remain  in effect (meaning this test can be used) for the duration of the COVID-19 declaration under Section 56 4(b)(1) of the Act, 21 U.S.C. section 360bbb-3(b)(1), unless the authorization is terminated or revoked sooner. Performed at [facility], [place] [facility] [address] [place], [place] [address]      Labs: CBC: Recent Labs  Lab 12/20/18 1311  WBC 16.7*  HGB 15.6*  HCT 47.2*  MCV 88.1  PLT 675    Basic Metabolic Panel: Recent Labs  Lab 12/20/18 1311  NA 139  K 3.7  CL 99  CO2 27  GLUCOSE 145*  BUN 8  CREATININE 0.97  CALCIUM 9.8    Liver Function Tests: Recent Labs  Lab 12/20/18 1411  AST 15  ALT 10  ALKPHOS 80  BILITOT 0.6  PROT 8.1  ALBUMIN 4.1    CBG: Recent Labs  Lab 12/22/18 1216 12/22/18 1557 12/22/18 2139 12/23/18 0625 12/23/18 1108  GLUCAP 100* 127* 115* 124* 108*    Hgb A1c Recent Labs    12/21/18 0315  HGBA1C 7.0*    Lipid Profile Recent Labs    12/21/18 0315  CHOL 143  HDL 27*  LDLCALC 90  TRIG 130  CHOLHDL 5.3     Time coordinating discharge: 40 minutes  SIGNED:  Vernell Leep, MD, [place], [facility]. [facility]  To contact the attending provider between 7A-7P or the covering provider during after hours 7P-7A, please log into the web site [email or web address] and access using universal [place] password for that web site. If you do not have the password, please call the hospital operator.

## 2018-12-23 NOTE — Progress Notes (Addendum)
Physical Therapy Treatment Patient Details Name: Ashley Price MRN: [id number] DOB: [birth year] Today's Date: 12/23/2018    History of Present Illness Ashley Price is a 63 y.o. female with medical history significant of hypertension and osteoarthritis who presents to the emergency department with a chief complaint of dizziness, imbalance, and R sided headache. MRI revealed acute stroke of R posterior medulla.    PT Comments    Pt performed gt training and progression to stair training.  Pt required cues for safety throughout session as she continues to present with LOB with ambulation to the R.  She required a lot of visual input to maintain balance and her balance is more impaired with head turns.  She experienced LOB x2 with moderate assistance to correct.  Based on CIR denial she will return home with HHPT.  Spoke with her Son Yasheka Halleran who reports he will be able to assist her.  Will inform supervising PT to cosign change in recommendations at this time.     Follow Up Recommendations  Supervision/Assistance - 24 hour;Home health PT     Equipment Recommendations  Rolling walker with 5" wheels    Recommendations for Other Services       Precautions / Restrictions Precautions Precautions: Fall Restrictions Weight Bearing Restrictions: No    Mobility  Bed Mobility Overal bed mobility: Modified Independent             General bed mobility comments: use of rail  Transfers Overall transfer level: Needs assistance Equipment used: Rolling walker (2 wheeled) Transfers: Sit to/from Stand Sit to Stand: Supervision         General transfer comment: assistance for safety and balance  Ambulation/Gait Ambulation/Gait assistance: Supervision;Mod assist(supervision initially and with progression of gt and L head turn she presents with LOB to the R require moderated assistance to correct.) Gait Distance (Feet): 120 Feet Assistive device: Rolling walker (2 wheeled) Gait  Pattern/deviations: Step-through pattern;Decreased stance time - right;Staggering right;Wide base of support     General Gait Details: pt with R lateral bias and several LOB to R side; cues required for safe use of AD and for cadence; assistance required to recover from each LOB.  Cues for visual input and to stop motion with head turns until balance improves.   Stairs Stairs: Yes Stairs assistance: Supervision Stair Management: Backwards;With walker Number of Stairs: 1 General stair comments: Cues for sequencing and hand placement to negotiate x1 stair.   Wheelchair Mobility    Modified Rankin (Stroke Patients Only) Modified Rankin (Stroke Patients Only) Pre-Morbid Rankin Score: No symptoms Modified Rankin: Moderately severe disability     Balance Overall balance assessment: Needs assistance Sitting-balance support: Feet supported Sitting balance-Leahy Scale: Good       Standing balance-Leahy Scale: Poor Standing balance comment: Pt requiring min A for balance standing at sink during oral care task               High Level Balance Comments: cues for step lengths and assistance for balance; pt taking very large steps backward with R LE            Cognition Arousal/Alertness: Awake/alert Behavior During Therapy: WFL for tasks assessed/performed Overall Cognitive Status: Impaired/Different from baseline Area of Impairment: Safety/judgement;Problem solving                     Memory: Decreased short-term memory Following Commands: Follows one step commands inconsistently;Follows one step commands with increased time Safety/Judgement: Decreased awareness of safety;Decreased  awareness of deficits Awareness: Intellectual Problem Solving: Requires verbal cues General Comments: pt reports awareness of impaired balance however is impulsive to mobilize and requires cues throughout for safety      Exercises      General Comments        Pertinent  Vitals/Pain Pain Assessment: No/denies pain    Home Living                      Prior Function            PT Goals (current goals can now be found in the care plan section) Acute Rehab PT Goals Patient Stated Goal: go home Potential to Achieve Goals: Good Progress towards PT goals: Progressing toward goals    Frequency    Min 4X/week      PT Plan Current plan remains appropriate    Co-evaluation              AM-PAC PT "6 Clicks" Mobility   Outcome Measure  Help needed turning from your back to your side while in a flat bed without using bedrails?: None Help needed moving from lying on your back to sitting on the side of a flat bed without using bedrails?: None Help needed moving to and from a bed to a chair (including a wheelchair)?: A Little Help needed standing up from a chair using your arms (e.g., wheelchair or bedside chair)?: A Little Help needed to walk in hospital room?: A Lot Help needed climbing 3-5 steps with a railing? : A Little 6 Click Score: 19    End of Session Equipment Utilized During Treatment: Gait belt Activity Tolerance: Patient tolerated treatment well Patient left: in chair;with call bell/phone within reach;with chair alarm set Nurse Communication: Mobility status PT Visit Diagnosis: Unsteadiness on feet (R26.81);Other abnormalities of gait and mobility (R26.89);History of falling (Z91.81);Difficulty in walking, not elsewhere classified (R26.2);Other symptoms and signs involving the nervous system (R29.898)     Time: [id number] PT Time Calculation (min) (ACUTE ONLY): 10 min  Charges:  $Gait Training: 8-22 mins                     Erasmo Leventhal , PTA Acute Rehabilitation Services Pager [phone] Office [phone]     Jazzy Parmer Eli Hose 12/23/2018, 2:51 PM

## 2018-12-23 NOTE — Discharge Instructions (Signed)

## 2018-12-23 NOTE — Care Management Important Message (Signed)
Important Message  Patient Details  Name: Ashley Price MRN: [id number] Date of Birth: 1955-04-15   Medicare Important Message Given:  Yes     Orbie Pyo 12/23/2018, 3:01 PM

## 2018-12-23 NOTE — Progress Notes (Signed)
Inpatient Rehabilitation-Admissions Coordinator   Please see consult note from PM&R MD for details. Pt is progressing well with therapies and is at a level where she is able to go home with family support. Pt prefers home at this time. I have notified TOC team regarding Dr. Dianna Limbo recommendation.   AC will sign off.   Please call if questions.   Jhonnie Garner, OTR/L  Rehab Admissions Coordinator  [phone] 12/23/2018 12:56 PM

## 2018-12-23 NOTE — Progress Notes (Signed)
Patient IV removed without complication, patient tolerated it well. Patient discharge instructions reviewed and patient verbalized understanding. Patient has walker and bedside commode at discharge. All questions answered and patient picked up by son at the main entrance.

## 2018-12-23 NOTE — Progress Notes (Signed)
Occupational Therapy Treatment Patient Details Name: Ashley Price MRN: [id number] DOB: [birth year] Today's Date: 12/23/2018    History of present illness ALEERA BINKOWSKI is a 63 y.o. female with medical history significant of hypertension and osteoarthritis who presents to the emergency department with a chief complaint of dizziness, imbalance, and R sided headache. MRI revealed acute stroke of R posterior medulla.   OT comments  Treatment session with focus on dynamic standing balance and safety awareness during functional mobility and self-care tasks.  Engaged in dynamic reaching task, incorporating reaching towards floor to retrieve items from floor.  Supervision with ambulation with RW and Min guard when bending to retrieve dropped items.  Pt somewhat impulsive with pushing RW aside when approaching sink and toilet, Min cues for safety.  Pt asking "why won't you guys let me go home?" discussed concerns noted on eval and safety concerns.  Pt with improved balance throughout session.  Pt has 24/7 supervision/assist at home.  Follow Up Recommendations  Supervision/Assistance - 24 hour;Home health OT    Equipment Recommendations   TBD    Recommendations for Other Services      Precautions / Restrictions Precautions Precautions: Fall Restrictions Weight Bearing Restrictions: No       Mobility Bed Mobility Overal bed mobility: Modified Independent             General bed mobility comments: use of rail  Transfers   Equipment used: Rolling walker (2 wheeled) Transfers: Sit to/from Stand Sit to Stand: Supervision                  ADL either performed or assessed with clinical judgement   ADL       Grooming: Oral care;Wash/dry face;Standing;Supervision/safety   Upper Body Bathing: Supervision/ safety;Sitting   Lower Body Bathing: Sit to/from stand;Supervison/ safety   Upper Body Dressing : Sitting;Supervision/safety   Lower Body Dressing: Sit to/from  stand;Min guard   Toilet Transfer: Supervision/safety;Ambulation;RW Toilet Transfer Details (indicate cue type and reason): Min cues for safety during transfer as pt attempting to leave RW behind Toileting- Clothing Manipulation and Hygiene: Modified independent;Sit to/from stand       Functional mobility during ADLs: Supervision/safety;Rolling walker General ADL Comments: Focused session on safety awareness as pt attempting to push RW aside during transfers               Cognition Arousal/Alertness: Awake/alert Behavior During Therapy: WFL for tasks assessed/performed Overall Cognitive Status: Impaired/Different from baseline Area of Impairment: Safety/judgement;Problem solving                         Safety/Judgement: Decreased awareness of safety;Decreased awareness of deficits   Problem Solving: Requires verbal cues General Comments: pt reports awareness of impaired balance however is impulsive to mobilize and requires cues throughout for safety                   Pertinent Vitals/ Pain       Pain Assessment: No/denies pain         Frequency  Min 2X/week        Progress Toward Goals  OT Goals(current goals can now be found in the care plan section)  Progress towards OT goals: Progressing toward goals  Acute Rehab OT Goals Patient Stated Goal: go home OT Goal Formulation: With patient Time For Goal Achievement: 01/04/19 Potential to Achieve Goals: Good  Plan Discharge plan remains appropriate       AM-PAC OT "  6 Clicks" Daily Activity     Outcome Measure   Help from another person eating meals?: None Help from another person taking care of personal grooming?: None Help from another person toileting, which includes using toliet, bedpan, or urinal?: None Help from another person bathing (including washing, rinsing, drying)?: A Little Help from another person to put on and taking off regular upper body clothing?: None Help from another  person to put on and taking off regular lower body clothing?: A Little 6 Click Score: 22    End of Session Equipment Utilized During Treatment: Gait belt;Rolling walker  OT Visit Diagnosis: Unsteadiness on feet (R26.81);Other abnormalities of gait and mobility (R26.89);Repeated falls (R29.6);History of falling (Z91.81);Other symptoms and signs involving cognitive function   Activity Tolerance Patient tolerated treatment well   Patient Left in bed;with call bell/phone within reach;with bed alarm set   Nurse Communication Mobility status        Time: [id number] OT Time Calculation (min): 21 min  Charges: OT General Charges $OT Visit: 1 Visit OT Treatments $Therapeutic Activity: 8-22 mins    Simonne Come [phone] 12/23/2018, 12:05 PM

## 2018-12-27 DIAGNOSIS — I10 Essential (primary) hypertension: Secondary | ICD-10-CM | POA: Diagnosis not present

## 2018-12-27 DIAGNOSIS — R2689 Other abnormalities of gait and mobility: Secondary | ICD-10-CM | POA: Diagnosis not present

## 2018-12-27 DIAGNOSIS — Z7984 Long term (current) use of oral hypoglycemic drugs: Secondary | ICD-10-CM | POA: Diagnosis not present

## 2018-12-27 DIAGNOSIS — R69 Illness, unspecified: Secondary | ICD-10-CM | POA: Diagnosis not present

## 2018-12-27 DIAGNOSIS — Z89422 Acquired absence of other left toe(s): Secondary | ICD-10-CM | POA: Diagnosis not present

## 2018-12-27 DIAGNOSIS — I69398 Other sequelae of cerebral infarction: Secondary | ICD-10-CM | POA: Diagnosis not present

## 2018-12-27 DIAGNOSIS — M170 Bilateral primary osteoarthritis of knee: Secondary | ICD-10-CM | POA: Diagnosis not present

## 2018-12-27 DIAGNOSIS — Z9181 History of falling: Secondary | ICD-10-CM | POA: Diagnosis not present

## 2018-12-27 DIAGNOSIS — Z7982 Long term (current) use of aspirin: Secondary | ICD-10-CM | POA: Diagnosis not present

## 2018-12-27 DIAGNOSIS — N170 Acute kidney failure with tubular necrosis: Secondary | ICD-10-CM | POA: Diagnosis not present

## 2018-12-27 DIAGNOSIS — E119 Type 2 diabetes mellitus without complications: Secondary | ICD-10-CM | POA: Diagnosis not present

## 2018-12-31 DIAGNOSIS — R2689 Other abnormalities of gait and mobility: Secondary | ICD-10-CM | POA: Diagnosis not present

## 2018-12-31 DIAGNOSIS — Z9181 History of falling: Secondary | ICD-10-CM | POA: Diagnosis not present

## 2018-12-31 DIAGNOSIS — Z7984 Long term (current) use of oral hypoglycemic drugs: Secondary | ICD-10-CM | POA: Diagnosis not present

## 2018-12-31 DIAGNOSIS — E119 Type 2 diabetes mellitus without complications: Secondary | ICD-10-CM | POA: Diagnosis not present

## 2018-12-31 DIAGNOSIS — M170 Bilateral primary osteoarthritis of knee: Secondary | ICD-10-CM | POA: Diagnosis not present

## 2018-12-31 DIAGNOSIS — Z7982 Long term (current) use of aspirin: Secondary | ICD-10-CM | POA: Diagnosis not present

## 2018-12-31 DIAGNOSIS — I69398 Other sequelae of cerebral infarction: Secondary | ICD-10-CM | POA: Diagnosis not present

## 2018-12-31 DIAGNOSIS — I10 Essential (primary) hypertension: Secondary | ICD-10-CM | POA: Diagnosis not present

## 2018-12-31 DIAGNOSIS — Z89422 Acquired absence of other left toe(s): Secondary | ICD-10-CM | POA: Diagnosis not present

## 2018-12-31 DIAGNOSIS — R69 Illness, unspecified: Secondary | ICD-10-CM | POA: Diagnosis not present

## 2019-01-01 DIAGNOSIS — E6609 Other obesity due to excess calories: Secondary | ICD-10-CM | POA: Diagnosis not present

## 2019-01-01 DIAGNOSIS — Z8673 Personal history of transient ischemic attack (TIA), and cerebral infarction without residual deficits: Secondary | ICD-10-CM | POA: Diagnosis not present

## 2019-01-01 DIAGNOSIS — I10 Essential (primary) hypertension: Secondary | ICD-10-CM | POA: Diagnosis not present

## 2019-01-01 DIAGNOSIS — E1169 Type 2 diabetes mellitus with other specified complication: Secondary | ICD-10-CM | POA: Diagnosis not present

## 2019-01-01 DIAGNOSIS — Z1389 Encounter for screening for other disorder: Secondary | ICD-10-CM | POA: Diagnosis not present

## 2019-01-02 DIAGNOSIS — R69 Illness, unspecified: Secondary | ICD-10-CM | POA: Diagnosis not present

## 2019-01-02 DIAGNOSIS — M170 Bilateral primary osteoarthritis of knee: Secondary | ICD-10-CM | POA: Diagnosis not present

## 2019-01-02 DIAGNOSIS — I10 Essential (primary) hypertension: Secondary | ICD-10-CM | POA: Diagnosis not present

## 2019-01-02 DIAGNOSIS — Z89422 Acquired absence of other left toe(s): Secondary | ICD-10-CM | POA: Diagnosis not present

## 2019-01-02 DIAGNOSIS — I69398 Other sequelae of cerebral infarction: Secondary | ICD-10-CM | POA: Diagnosis not present

## 2019-01-02 DIAGNOSIS — E119 Type 2 diabetes mellitus without complications: Secondary | ICD-10-CM | POA: Diagnosis not present

## 2019-01-02 DIAGNOSIS — R2689 Other abnormalities of gait and mobility: Secondary | ICD-10-CM | POA: Diagnosis not present

## 2019-01-02 DIAGNOSIS — Z7984 Long term (current) use of oral hypoglycemic drugs: Secondary | ICD-10-CM | POA: Diagnosis not present

## 2019-01-02 DIAGNOSIS — Z9181 History of falling: Secondary | ICD-10-CM | POA: Diagnosis not present

## 2019-01-02 DIAGNOSIS — Z7982 Long term (current) use of aspirin: Secondary | ICD-10-CM | POA: Diagnosis not present

## 2019-01-03 DIAGNOSIS — R2689 Other abnormalities of gait and mobility: Secondary | ICD-10-CM | POA: Diagnosis not present

## 2019-01-03 DIAGNOSIS — M170 Bilateral primary osteoarthritis of knee: Secondary | ICD-10-CM | POA: Diagnosis not present

## 2019-01-03 DIAGNOSIS — Z9181 History of falling: Secondary | ICD-10-CM | POA: Diagnosis not present

## 2019-01-03 DIAGNOSIS — R69 Illness, unspecified: Secondary | ICD-10-CM | POA: Diagnosis not present

## 2019-01-03 DIAGNOSIS — I10 Essential (primary) hypertension: Secondary | ICD-10-CM | POA: Diagnosis not present

## 2019-01-03 DIAGNOSIS — E119 Type 2 diabetes mellitus without complications: Secondary | ICD-10-CM | POA: Diagnosis not present

## 2019-01-03 DIAGNOSIS — Z7984 Long term (current) use of oral hypoglycemic drugs: Secondary | ICD-10-CM | POA: Diagnosis not present

## 2019-01-03 DIAGNOSIS — Z7982 Long term (current) use of aspirin: Secondary | ICD-10-CM | POA: Diagnosis not present

## 2019-01-03 DIAGNOSIS — Z89422 Acquired absence of other left toe(s): Secondary | ICD-10-CM | POA: Diagnosis not present

## 2019-01-03 DIAGNOSIS — I69398 Other sequelae of cerebral infarction: Secondary | ICD-10-CM | POA: Diagnosis not present

## 2019-01-06 DIAGNOSIS — Z89422 Acquired absence of other left toe(s): Secondary | ICD-10-CM | POA: Diagnosis not present

## 2019-01-06 DIAGNOSIS — Z7984 Long term (current) use of oral hypoglycemic drugs: Secondary | ICD-10-CM | POA: Diagnosis not present

## 2019-01-06 DIAGNOSIS — Z7982 Long term (current) use of aspirin: Secondary | ICD-10-CM | POA: Diagnosis not present

## 2019-01-06 DIAGNOSIS — R2689 Other abnormalities of gait and mobility: Secondary | ICD-10-CM | POA: Diagnosis not present

## 2019-01-06 DIAGNOSIS — M170 Bilateral primary osteoarthritis of knee: Secondary | ICD-10-CM | POA: Diagnosis not present

## 2019-01-06 DIAGNOSIS — I69398 Other sequelae of cerebral infarction: Secondary | ICD-10-CM | POA: Diagnosis not present

## 2019-01-06 DIAGNOSIS — R69 Illness, unspecified: Secondary | ICD-10-CM | POA: Diagnosis not present

## 2019-01-06 DIAGNOSIS — E119 Type 2 diabetes mellitus without complications: Secondary | ICD-10-CM | POA: Diagnosis not present

## 2019-01-06 DIAGNOSIS — Z9181 History of falling: Secondary | ICD-10-CM | POA: Diagnosis not present

## 2019-01-06 DIAGNOSIS — I10 Essential (primary) hypertension: Secondary | ICD-10-CM | POA: Diagnosis not present

## 2019-01-09 DIAGNOSIS — R2689 Other abnormalities of gait and mobility: Secondary | ICD-10-CM | POA: Diagnosis not present

## 2019-01-09 DIAGNOSIS — Z7984 Long term (current) use of oral hypoglycemic drugs: Secondary | ICD-10-CM | POA: Diagnosis not present

## 2019-01-09 DIAGNOSIS — I69398 Other sequelae of cerebral infarction: Secondary | ICD-10-CM | POA: Diagnosis not present

## 2019-01-09 DIAGNOSIS — Z9181 History of falling: Secondary | ICD-10-CM | POA: Diagnosis not present

## 2019-01-09 DIAGNOSIS — M170 Bilateral primary osteoarthritis of knee: Secondary | ICD-10-CM | POA: Diagnosis not present

## 2019-01-09 DIAGNOSIS — E119 Type 2 diabetes mellitus without complications: Secondary | ICD-10-CM | POA: Diagnosis not present

## 2019-01-09 DIAGNOSIS — R69 Illness, unspecified: Secondary | ICD-10-CM | POA: Diagnosis not present

## 2019-01-09 DIAGNOSIS — I10 Essential (primary) hypertension: Secondary | ICD-10-CM | POA: Diagnosis not present

## 2019-01-09 DIAGNOSIS — Z89422 Acquired absence of other left toe(s): Secondary | ICD-10-CM | POA: Diagnosis not present

## 2019-01-09 DIAGNOSIS — Z7982 Long term (current) use of aspirin: Secondary | ICD-10-CM | POA: Diagnosis not present

## 2019-01-13 DIAGNOSIS — I69398 Other sequelae of cerebral infarction: Secondary | ICD-10-CM | POA: Diagnosis not present

## 2019-01-13 DIAGNOSIS — Z7982 Long term (current) use of aspirin: Secondary | ICD-10-CM | POA: Diagnosis not present

## 2019-01-13 DIAGNOSIS — Z7984 Long term (current) use of oral hypoglycemic drugs: Secondary | ICD-10-CM | POA: Diagnosis not present

## 2019-01-13 DIAGNOSIS — E119 Type 2 diabetes mellitus without complications: Secondary | ICD-10-CM | POA: Diagnosis not present

## 2019-01-13 DIAGNOSIS — R69 Illness, unspecified: Secondary | ICD-10-CM | POA: Diagnosis not present

## 2019-01-13 DIAGNOSIS — I10 Essential (primary) hypertension: Secondary | ICD-10-CM | POA: Diagnosis not present

## 2019-01-13 DIAGNOSIS — Z89422 Acquired absence of other left toe(s): Secondary | ICD-10-CM | POA: Diagnosis not present

## 2019-01-13 DIAGNOSIS — M170 Bilateral primary osteoarthritis of knee: Secondary | ICD-10-CM | POA: Diagnosis not present

## 2019-01-13 DIAGNOSIS — R2689 Other abnormalities of gait and mobility: Secondary | ICD-10-CM | POA: Diagnosis not present

## 2019-01-13 DIAGNOSIS — Z9181 History of falling: Secondary | ICD-10-CM | POA: Diagnosis not present

## 2019-01-15 DIAGNOSIS — R69 Illness, unspecified: Secondary | ICD-10-CM | POA: Diagnosis not present

## 2019-01-15 DIAGNOSIS — R2689 Other abnormalities of gait and mobility: Secondary | ICD-10-CM | POA: Diagnosis not present

## 2019-01-15 DIAGNOSIS — E119 Type 2 diabetes mellitus without complications: Secondary | ICD-10-CM | POA: Diagnosis not present

## 2019-01-15 DIAGNOSIS — M170 Bilateral primary osteoarthritis of knee: Secondary | ICD-10-CM | POA: Diagnosis not present

## 2019-01-15 DIAGNOSIS — Z7984 Long term (current) use of oral hypoglycemic drugs: Secondary | ICD-10-CM | POA: Diagnosis not present

## 2019-01-15 DIAGNOSIS — I69398 Other sequelae of cerebral infarction: Secondary | ICD-10-CM | POA: Diagnosis not present

## 2019-01-15 DIAGNOSIS — Z9181 History of falling: Secondary | ICD-10-CM | POA: Diagnosis not present

## 2019-01-15 DIAGNOSIS — Z89422 Acquired absence of other left toe(s): Secondary | ICD-10-CM | POA: Diagnosis not present

## 2019-01-15 DIAGNOSIS — I10 Essential (primary) hypertension: Secondary | ICD-10-CM | POA: Diagnosis not present

## 2019-01-15 DIAGNOSIS — Z7982 Long term (current) use of aspirin: Secondary | ICD-10-CM | POA: Diagnosis not present

## 2019-01-21 DIAGNOSIS — M170 Bilateral primary osteoarthritis of knee: Secondary | ICD-10-CM | POA: Diagnosis not present

## 2019-01-21 DIAGNOSIS — E119 Type 2 diabetes mellitus without complications: Secondary | ICD-10-CM | POA: Diagnosis not present

## 2019-01-21 DIAGNOSIS — Z9181 History of falling: Secondary | ICD-10-CM | POA: Diagnosis not present

## 2019-01-21 DIAGNOSIS — I69398 Other sequelae of cerebral infarction: Secondary | ICD-10-CM | POA: Diagnosis not present

## 2019-01-21 DIAGNOSIS — R69 Illness, unspecified: Secondary | ICD-10-CM | POA: Diagnosis not present

## 2019-01-21 DIAGNOSIS — Z7984 Long term (current) use of oral hypoglycemic drugs: Secondary | ICD-10-CM | POA: Diagnosis not present

## 2019-01-21 DIAGNOSIS — Z89422 Acquired absence of other left toe(s): Secondary | ICD-10-CM | POA: Diagnosis not present

## 2019-01-21 DIAGNOSIS — R2689 Other abnormalities of gait and mobility: Secondary | ICD-10-CM | POA: Diagnosis not present

## 2019-01-21 DIAGNOSIS — Z7982 Long term (current) use of aspirin: Secondary | ICD-10-CM | POA: Diagnosis not present

## 2019-01-21 DIAGNOSIS — I10 Essential (primary) hypertension: Secondary | ICD-10-CM | POA: Diagnosis not present

## 2019-02-08 DIAGNOSIS — R69 Illness, unspecified: Secondary | ICD-10-CM | POA: Diagnosis not present

## 2019-02-11 ENCOUNTER — Encounter: Payer: Self-pay | Admitting: Neurology

## 2019-02-11 ENCOUNTER — Other Ambulatory Visit: Payer: Self-pay

## 2019-02-11 ENCOUNTER — Ambulatory Visit: Payer: Medicare HMO | Admitting: Neurology

## 2019-02-11 VITALS — BP 114/73 | HR 74 | Temp 97.2°F | Wt 177.0 lb

## 2019-02-11 DIAGNOSIS — I6381 Other cerebral infarction due to occlusion or stenosis of small artery: Secondary | ICD-10-CM

## 2019-02-11 NOTE — Patient Instructions (Signed)
I had a long d/w patient about her recent lacunar stroke, risk for recurrent stroke/TIAs, personally independently reviewed imaging studies and stroke evaluation results and answered questions.Continue aspirin 81 mg daily  for secondary stroke prevention and maintain strict control of hypertension with blood pressure goal below 130/90, diabetes with hemoglobin A1c goal below 6.5% and lipids with LDL cholesterol goal below 70 mg/dL. I also advised the patient to eat a healthy diet with plenty of whole grains, cereals, fruits and vegetables, exercise regularly and maintain ideal body weight I complemented her on smoking cessation and advised her to continue to do so.  Check CT angiogram of the brain and neck as well as follow-up lipid profile and hemoglobin A1c..Followup in the future with my nurse practitioner Janett Billow in 3 months or call earlier if necessary.  Stroke Prevention Some medical conditions and behaviors are associated with a higher chance of having a stroke. You can help prevent a stroke by making nutrition, lifestyle, and other changes, including managing any medical conditions you may have. What nutrition changes can be made?   Eat healthy foods. You can do this by: ? Choosing foods high in fiber, such as fresh fruits and vegetables and whole grains. ? Eating at least 5 or more servings of fruits and vegetables a day. Try to fill half of your plate at each meal with fruits and vegetables. ? Choosing lean protein foods, such as lean cuts of meat, poultry without skin, fish, tofu, beans, and nuts. ? Eating low-fat dairy products. ? Avoiding foods that are high in salt (sodium). This can help lower blood pressure. ? Avoiding foods that have saturated fat, trans fat, and cholesterol. This can help prevent high cholesterol. ? Avoiding processed and premade foods.  Follow your health care provider's specific guidelines for losing weight, controlling high blood pressure (hypertension), lowering  high cholesterol, and managing diabetes. These may include: ? Reducing your daily calorie intake. ? Limiting your daily sodium intake to 1,500 milligrams (mg). ? Using only healthy fats for cooking, such as olive oil, canola oil, or sunflower oil. ? Counting your daily carbohydrate intake. What lifestyle changes can be made?  Maintain a healthy weight. Talk to your health care provider about your ideal weight.  Get at least 30 minutes of moderate physical activity at least 5 days a week. Moderate activity includes brisk walking, biking, and swimming.  Do not use any products that contain nicotine or tobacco, such as cigarettes and e-cigarettes. If you need help quitting, ask your health care provider. It may also be helpful to avoid exposure to secondhand smoke.  Limit alcohol intake to no more than 1 drink a day for nonpregnant women and 2 drinks a day for men. One drink equals 12 oz of beer, 5 oz of wine, or 1 oz of hard liquor.  Stop any illegal drug use.  Avoid taking birth control pills. Talk to your health care provider about the risks of taking birth control pills if: ? You are over 51 years old. ? You smoke. ? You get migraines. ? You have ever had a blood clot. What other changes can be made?  Manage your cholesterol levels. ? Eating a healthy diet is important for preventing high cholesterol. If cholesterol cannot be managed through diet alone, you may also need to take medicines. ? Take any prescribed medicines to control your cholesterol as told by your health care provider.  Manage your diabetes. ? Eating a healthy diet and exercising regularly are important parts  of managing your blood sugar. If your blood sugar cannot be managed through diet and exercise, you may need to take medicines. ? Take any prescribed medicines to control your diabetes as told by your health care provider.  Control your hypertension. ? To reduce your risk of stroke, try to keep your blood  pressure below 130/80. ? Eating a healthy diet and exercising regularly are an important part of controlling your blood pressure. If your blood pressure cannot be managed through diet and exercise, you may need to take medicines. ? Take any prescribed medicines to control hypertension as told by your health care provider. ? Ask your health care provider if you should monitor your blood pressure at home. ? Have your blood pressure checked every year, even if your blood pressure is normal. Blood pressure increases with age and some medical conditions.  Get evaluated for sleep disorders (sleep apnea). Talk to your health care provider about getting a sleep evaluation if you snore a lot or have excessive sleepiness.  Take over-the-counter and prescription medicines only as told by your health care provider. Aspirin or blood thinners (antiplatelets or anticoagulants) may be recommended to reduce your risk of forming blood clots that can lead to stroke.  Make sure that any other medical conditions you have, such as atrial fibrillation or atherosclerosis, are managed. What are the warning signs of a stroke? The warning signs of a stroke can be easily remembered as BEFAST.  B is for balance. Signs include: ? Dizziness. ? Loss of balance or coordination. ? Sudden trouble walking.  E is for eyes. Signs include: ? A sudden change in vision. ? Trouble seeing.  F is for face. Signs include: ? Sudden weakness or numbness of the face. ? The face or eyelid drooping to one side.  A is for arms. Signs include: ? Sudden weakness or numbness of the arm, usually on one side of the body.  S is for speech. Signs include: ? Trouble speaking (aphasia). ? Trouble understanding.  T is for time. ? These symptoms may represent a serious problem that is an emergency. Do not wait to see if the symptoms will go away. Get medical help right away. Call your local emergency services (911 in the [place] Do not drive  yourself to the hospital.  Other signs of stroke may include: ? A sudden, severe headache with no known cause. ? Nausea or vomiting. ? Seizure. Where to find more information For more information, visit:  American Stroke Association: www.strokeassociation.org  National Stroke Association: www.stroke.org Summary  You can prevent a stroke by eating healthy, exercising, not smoking, limiting alcohol intake, and managing any medical conditions you may have.  Do not use any products that contain nicotine or tobacco, such as cigarettes and e-cigarettes. If you need help quitting, ask your health care provider. It may also be helpful to avoid exposure to secondhand smoke.  Remember BEFAST for warning signs of stroke. Get help right away if you or a loved one has any of these signs. This information is not intended to replace advice given to you by your health care provider. Make sure you discuss any questions you have with your health care provider. Document Revised: 12/29/2016 Document Reviewed: 02/22/2016 Elsevier Patient Education  2020 [employer].

## 2019-02-11 NOTE — Progress Notes (Signed)
[facility] [address] [place]. [place] [address] [phone]       OFFICE CONSULT NOTE  [name] Date of Birth:  1955/03/07 Medical Record Number:  [id number]   Referring MD: Vernell Leep   Reason for Referral: Stroke HPI: Ashley Price is a pleasant 64 year old African-American lady seen today for initial office consultation visit for stroke.  History is obtained from the patient, review of electronic medical records and I personally reviewed imaging films in PACS.  She is a pleasant lady with no significant past medical history except for hypertension who presented with sudden onset of dizziness which she described as sensation of floating on board and walking off balance like a drunk.  She felt nauseous and threw up.  She went to bed and then she woke up early in the morning to use the bathroom she was still dizzy off balance and felt unsteady on her feet and fell down.  She did not lose consciousness.  She denied significant headache, loss of vision, double vision slurred speech or focal extremity weakness or numbness.  CT scan of the head was unremarkable but MRI scan showed a tiny punctate right lateral medullary infarct.  Carotid is also multiple old remote is basal ganglia lacunar infarcts noted.  Carotid ultrasound was unremarkable.  CT angiogram or MR angiogram were not obtained.  Transthoracic echo showed normal ejection fraction without cardiac source of embolism.  Hemoglobin A1c was 7.2.  LDL cholesterol was 90 mg percent.  Patient was asked to date on aspirin which she is taking and tolerating well without side effects.  She was also started on Lipitor which she is tolerating well without muscle aches and pains.  She states her gait and balance have improved however states she is still using a cane but because of osteoarthritis in the knees and she plans to have surgery soon.  She has quit smoking completely.  She had previously smoked half pack per day for  40 years.  She has no new complaints.  She feels she is fully recovered from her dizziness and gait ataxia from a stroke in a couple of days after her stroke ROS:   14 system review of systems is positive for dizziness, gait ataxia, imbalance, knee pain, gait difficulty all other systems negative  PMH:  Past Medical History:  Diagnosis Date  . Hypertension   . Osteoarthritis   . Stroke [facility])     Social History:  Social History   Socioeconomic History  . Marital status: Single    Spouse name: Not on file  . Number of children: Not on file  . Years of education: Not on file  . Highest education level: Not on file  Occupational History  . Not on file  Tobacco Use  . Smoking status: Former Smoker    Types: Cigarettes    Quit date: 12/30/2018    Years since quitting: 0.1  . Smokeless tobacco: Never Used  Substance and Sexual Activity  . Alcohol use: No  . Drug use: No  . Sexual activity: Not on file  Other Topics Concern  . Not on file  Social History Narrative   Lives with her children   Social Determinants of Health   Financial Resource Strain:   . Difficulty of Paying Living Expenses: Not on file  Food Insecurity:   . Worried About Charity fundraiser in the Last Year: Not on file  . Ran Out of Food in the Last Year: Not  on file  Transportation Needs:   . Lack of Transportation (Medical): Not on file  . Lack of Transportation (Non-Medical): Not on file  Physical Activity:   . Days of Exercise per Week: Not on file  . Minutes of Exercise per Session: Not on file  Stress:   . Feeling of Stress : Not on file  Social Connections:   . Frequency of Communication with Friends and Family: Not on file  . Frequency of Social Gatherings with Friends and Family: Not on file  . Attends Religious Services: Not on file  . Active Member of Clubs or Organizations: Not on file  . Attends Archivist Meetings: Not on file  . Marital Status: Not on file  Intimate  Partner Violence:   . Fear of Current or Ex-Partner: Not on file  . Emotionally Abused: Not on file  . Physically Abused: Not on file  . Sexually Abused: Not on file    Medications:   Current Outpatient Medications on File Prior to Visit  Medication Sig Dispense Refill  . aspirin EC 81 MG EC tablet Take 1 tablet (81 mg total) by mouth daily. 30 tablet 0  . atorvastatin (LIPITOR) 40 MG tablet Take 1 tablet (40 mg total) by mouth daily at 6 PM. 30 tablet 0  . blood glucose meter kit and supplies KIT Dispense based on patient and insurance preference. Use up to four times daily as directed. (FOR ICD-9 250.00, 250.01). 1 each 0  . diclofenac sodium (VOLTAREN) 1 % GEL Apply 2 g topically 4 (four) times daily. 1 Tube 0  . lisinopril-hydrochlorothiazide (PRINZIDE,ZESTORETIC) 20-12.5 MG tablet Take 1 tablet by mouth daily.    . metFORMIN (GLUCOPHAGE) 500 MG tablet Take 1 tablet (500 mg total) by mouth daily with breakfast. 30 tablet 0  . nabumetone (RELAFEN) 500 MG tablet Take 500 mg by mouth 2 (two) times daily.     No current facility-administered medications on file prior to visit.    Allergies:  No Known Allergies  Physical Exam General: Pleasant middle-aged African-American lady, seated, in no evident distress Head: head normocephalic and atraumatic.   Neck: supple with no carotid or supraclavicular bruits Cardiovascular: regular rate and rhythm, no murmurs Musculoskeletal: no deformity Skin:  no rash/petichiae Vascular:  Normal pulses all extremities  Neurologic Exam Mental Status: Awake and fully alert. Oriented to place and time. Recent and remote memory intact. Attention span, concentration and fund of knowledge appropriate. Mood and affect appropriate.  Cranial Nerves: Fundoscopic exam reveals sharp disc margins. Pupils equal, briskly reactive to light. Extraocular movements full without nystagmus. Visual fields full to confrontation. Hearing intact. Facial sensation intact. Face,  tongue, palate moves normally and symmetrically.  Motor: Normal bulk and tone. Normal strength in all tested extremity muscles. Sensory.: intact to touch , pinprick , position and vibratory sensation.  Coordination: Rapid alternating movements normal in all extremities. Finger-to-nose and heel-to-shin performed accurately bilaterally. Gait and Station: Arises from chair with  difficulty. Stance is leaning to the left. Gait demonstrates favoring of the right knee due to pain.  Unable to walk tandem.   Reflexes: 1+ and symmetric. Toes downgoing.   NIHSS  0 Modified Rankin  2   ASSESSMENT: 64 year old African-American lady with small right lateral medullary lacunar infarct in November 2020 secondary to small vessel disease.  Vascular risk factors hyperlipidemia, diabetes, smoking and hypertension.     PLAN: I had a long d/w patient about her recent lacunar stroke, risk for recurrent stroke/TIAs, personally  independently reviewed imaging studies and stroke evaluation results and answered questions.Continue aspirin 81 mg daily  for secondary stroke prevention and maintain strict control of hypertension with blood pressure goal below 130/90, diabetes with hemoglobin A1c goal below 6.5% and lipids with LDL cholesterol goal below 70 mg/dL. I also advised the patient to eat a healthy diet with plenty of whole grains, cereals, fruits and vegetables, exercise regularly and maintain ideal body weight I complemented her on smoking cessation and advised her to continue to do so.  Check CT angiogram of the brain and neck as well as follow-up lipid profile and hemoglobin A1c.. Greater than 50% time during this 45-minute consultation visit was spent in counseling and coordination of care about lacunar stroke and discussion about stroke prevention and treatment and answering questions followup in the future with my nurse practitioner Janett Billow in 3 months or call earlier if necessary. Antony Contras, MD  [facility] [address] [place], [place] [address]  Phone [phone] Fax [phone] Note: This document was prepared with digital dictation and possible smart phrase technology. Any transcriptional errors that result from this process are unintentional.

## 2019-02-12 LAB — LIPID PANEL
Chol/HDL Ratio: 2.3 ratio (ref 0.0–4.4)
Cholesterol, Total: 82 mg/dL — ABNORMAL LOW (ref 100–199)
HDL: 36 mg/dL — ABNORMAL LOW (ref >39)
LDL Chol Calc (NIH): 29 mg/dL (ref 0–99)
Triglycerides: 80 mg/dL (ref 0–149)
VLDL Cholesterol Cal: 17 mg/dL (ref 5–40)

## 2019-02-12 LAB — HEMOGLOBIN A1C
Est. average glucose Bld gHb Est-mCnc: 154 mg/dL
Hgb A1c MFr Bld: 7 % — ABNORMAL HIGH (ref 4.8–5.6)

## 2019-02-19 DIAGNOSIS — Z803 Family history of malignant neoplasm of breast: Secondary | ICD-10-CM | POA: Diagnosis not present

## 2019-02-19 DIAGNOSIS — E1165 Type 2 diabetes mellitus with hyperglycemia: Secondary | ICD-10-CM | POA: Diagnosis not present

## 2019-02-19 DIAGNOSIS — Z809 Family history of malignant neoplasm, unspecified: Secondary | ICD-10-CM | POA: Diagnosis not present

## 2019-02-19 DIAGNOSIS — Z7984 Long term (current) use of oral hypoglycemic drugs: Secondary | ICD-10-CM | POA: Diagnosis not present

## 2019-02-19 DIAGNOSIS — E785 Hyperlipidemia, unspecified: Secondary | ICD-10-CM | POA: Diagnosis not present

## 2019-02-19 DIAGNOSIS — R69 Illness, unspecified: Secondary | ICD-10-CM | POA: Diagnosis not present

## 2019-02-19 DIAGNOSIS — E1151 Type 2 diabetes mellitus with diabetic peripheral angiopathy without gangrene: Secondary | ICD-10-CM | POA: Diagnosis not present

## 2019-02-19 DIAGNOSIS — Z7982 Long term (current) use of aspirin: Secondary | ICD-10-CM | POA: Diagnosis not present

## 2019-02-19 DIAGNOSIS — M1990 Unspecified osteoarthritis, unspecified site: Secondary | ICD-10-CM | POA: Diagnosis not present

## 2019-02-19 DIAGNOSIS — I10 Essential (primary) hypertension: Secondary | ICD-10-CM | POA: Diagnosis not present

## 2019-02-19 DIAGNOSIS — Z008 Encounter for other general examination: Secondary | ICD-10-CM | POA: Diagnosis not present

## 2019-02-26 ENCOUNTER — Other Ambulatory Visit: Payer: Self-pay | Admitting: Family Medicine

## 2019-02-26 DIAGNOSIS — I10 Essential (primary) hypertension: Secondary | ICD-10-CM | POA: Diagnosis not present

## 2019-02-26 DIAGNOSIS — Z13220 Encounter for screening for lipoid disorders: Secondary | ICD-10-CM | POA: Diagnosis not present

## 2019-02-26 DIAGNOSIS — Z89422 Acquired absence of other left toe(s): Secondary | ICD-10-CM | POA: Diagnosis not present

## 2019-02-26 DIAGNOSIS — R69 Illness, unspecified: Secondary | ICD-10-CM | POA: Diagnosis not present

## 2019-02-26 DIAGNOSIS — Z1389 Encounter for screening for other disorder: Secondary | ICD-10-CM | POA: Diagnosis not present

## 2019-02-26 DIAGNOSIS — Z1231 Encounter for screening mammogram for malignant neoplasm of breast: Secondary | ICD-10-CM

## 2019-02-26 DIAGNOSIS — Z0000 Encounter for general adult medical examination without abnormal findings: Secondary | ICD-10-CM | POA: Diagnosis not present

## 2019-02-26 DIAGNOSIS — I739 Peripheral vascular disease, unspecified: Secondary | ICD-10-CM | POA: Diagnosis not present

## 2019-02-26 DIAGNOSIS — K635 Polyp of colon: Secondary | ICD-10-CM | POA: Diagnosis not present

## 2019-02-26 DIAGNOSIS — E1169 Type 2 diabetes mellitus with other specified complication: Secondary | ICD-10-CM | POA: Diagnosis not present

## 2019-02-26 DIAGNOSIS — Z8673 Personal history of transient ischemic attack (TIA), and cerebral infarction without residual deficits: Secondary | ICD-10-CM | POA: Diagnosis not present

## 2019-02-27 ENCOUNTER — Ambulatory Visit
Admission: RE | Admit: 2019-02-27 | Discharge: 2019-02-27 | Disposition: A | Discharge status: Home / Self Care | Payer: Medicare HMO | Source: Ambulatory Visit | Attending: Neurology | Admitting: Neurology

## 2019-02-27 DIAGNOSIS — I6381 Other cerebral infarction due to occlusion or stenosis of small artery: Secondary | ICD-10-CM

## 2019-02-27 MED ORDER — IOPAMIDOL (ISOVUE-370) INJECTION 76%
75.0000 mL | Freq: Once | INTRAVENOUS | Status: AC | PRN
  Administered 2019-02-27: 75 mL via INTRAVENOUS

## 2019-03-05 ENCOUNTER — Ambulatory Visit
Admission: RE | Admit: 2019-03-05 | Discharge: 2019-03-05 | Disposition: A | Discharge status: Home / Self Care | Payer: Medicare HMO | Source: Ambulatory Visit | Attending: Family Medicine | Admitting: Family Medicine

## 2019-03-05 ENCOUNTER — Other Ambulatory Visit: Payer: Self-pay

## 2019-03-05 DIAGNOSIS — Z1231 Encounter for screening mammogram for malignant neoplasm of breast: Secondary | ICD-10-CM

## 2019-03-06 ENCOUNTER — Other Ambulatory Visit: Payer: Self-pay | Admitting: Family Medicine

## 2019-03-06 DIAGNOSIS — R928 Other abnormal and inconclusive findings on diagnostic imaging of breast: Secondary | ICD-10-CM

## 2019-03-06 NOTE — Progress Notes (Signed)
Kindly inform pt that ct angio of brain and neck did not show any major narrowings to worry about

## 2019-03-14 ENCOUNTER — Other Ambulatory Visit: Payer: Self-pay | Admitting: Family Medicine

## 2019-03-14 ENCOUNTER — Ambulatory Visit
Admission: RE | Admit: 2019-03-14 | Discharge: 2019-03-14 | Disposition: A | Discharge status: Home / Self Care | Payer: Medicare HMO | Source: Ambulatory Visit | Attending: Family Medicine | Admitting: Family Medicine

## 2019-03-14 ENCOUNTER — Other Ambulatory Visit: Payer: Self-pay

## 2019-03-14 DIAGNOSIS — R928 Other abnormal and inconclusive findings on diagnostic imaging of breast: Secondary | ICD-10-CM

## 2019-03-14 DIAGNOSIS — N6320 Unspecified lump in the left breast, unspecified quadrant: Secondary | ICD-10-CM

## 2019-03-14 DIAGNOSIS — N6489 Other specified disorders of breast: Secondary | ICD-10-CM | POA: Diagnosis not present

## 2019-03-14 DIAGNOSIS — N6001 Solitary cyst of right breast: Secondary | ICD-10-CM | POA: Diagnosis not present

## 2019-03-19 DIAGNOSIS — M1711 Unilateral primary osteoarthritis, right knee: Secondary | ICD-10-CM | POA: Diagnosis not present

## 2019-03-19 DIAGNOSIS — M25561 Pain in right knee: Secondary | ICD-10-CM | POA: Diagnosis not present

## 2019-04-17 DIAGNOSIS — R69 Illness, unspecified: Secondary | ICD-10-CM | POA: Diagnosis not present

## 2019-05-09 DIAGNOSIS — Z1159 Encounter for screening for other viral diseases: Secondary | ICD-10-CM | POA: Diagnosis not present

## 2019-05-14 DIAGNOSIS — D122 Benign neoplasm of ascending colon: Secondary | ICD-10-CM | POA: Diagnosis not present

## 2019-05-14 DIAGNOSIS — D120 Benign neoplasm of cecum: Secondary | ICD-10-CM | POA: Diagnosis not present

## 2019-05-14 DIAGNOSIS — Z86010 Personal history of colonic polyps: Secondary | ICD-10-CM | POA: Diagnosis not present

## 2019-05-14 LAB — HM COLONOSCOPY

## 2019-05-16 DIAGNOSIS — D120 Benign neoplasm of cecum: Secondary | ICD-10-CM | POA: Diagnosis not present

## 2019-05-16 DIAGNOSIS — D122 Benign neoplasm of ascending colon: Secondary | ICD-10-CM | POA: Diagnosis not present

## 2019-05-21 ENCOUNTER — Encounter: Payer: Self-pay | Admitting: Adult Health

## 2019-05-21 ENCOUNTER — Ambulatory Visit: Payer: Medicare HMO | Admitting: Adult Health

## 2019-05-21 VITALS — BP 134/80 | HR 69 | Temp 96.9°F | Ht 63.5 in | Wt 173.5 lb

## 2019-05-21 DIAGNOSIS — E785 Hyperlipidemia, unspecified: Secondary | ICD-10-CM

## 2019-05-21 DIAGNOSIS — I10 Essential (primary) hypertension: Secondary | ICD-10-CM

## 2019-05-21 DIAGNOSIS — E119 Type 2 diabetes mellitus without complications: Secondary | ICD-10-CM | POA: Diagnosis not present

## 2019-05-21 DIAGNOSIS — I639 Cerebral infarction, unspecified: Secondary | ICD-10-CM | POA: Diagnosis not present

## 2019-05-21 NOTE — Patient Instructions (Signed)
Continue aspirin 81 mg daily  and atorvastatin for secondary stroke prevention  Continue to follow up with PCP regarding cholesterol, blood pressure and diabetes management   Maintain strict control of hypertension with blood pressure goal below 130/90, diabetes with hemoglobin A1c goal below 6.5% and cholesterol with LDL cholesterol (bad cholesterol) goal below 70 mg/dL. I also advised the patient to eat a healthy diet with plenty of whole grains, cereals, fruits and vegetables, exercise regularly and maintain ideal body weight.  Overall stable from a stroke standpoint and recommend follow-up as needed       Thank you for coming to see [place] at [facility]. I hope we have been able to provide you high quality care today.  You may receive a patient satisfaction survey over the next few weeks. We would appreciate your feedback and comments so that we may continue to improve ourselves and the health of our patients.

## 2019-05-21 NOTE — Progress Notes (Signed)
[facility] [address] [place]. [place] [address] [phone]       OFFICE FOLLOW UP NOTE  [name] Date of Birth:  Jan 10, 1956 Medical Record Number:  [id number]   Referring MD: Vernell Leep  Reason for Referral: Stroke [facility] provider: Dr. Leonie Man   SUBJECTIVE:  Chief complaint: Chief Complaint  Patient presents with  . Follow-up    Rm 9, alone No concerns   . Cerebrovascular Accident      HPI:   Today, 05/21/2019, Ashley Price returns for stroke follow-up.  She has been stable from a stroke standpoint without residual deficits and denies new or reoccurring stroke/TIA symptoms.  CTA head/neck completed on 02/27/2019 which showed bilateral CCA and ICA mild plaque but no significant stenosis and mild intracranial stenosis but overall unremarkable. Continues on aspirin 81 mg daily and atorvastatin without side effects.  Recent LDL 29.  Blood pressure today satisfactory at 134/80.  Glucose levels stable with recent A1c 7.0.  No concerns at this time.    History provided for reference purposes only Initial visit 02/11/2019 Dr. Leonie Man: Ashley Price is a pleasant 64 year old African-American lady seen today for initial office consultation visit for stroke.  History is obtained from the patient, review of electronic medical records and I personally reviewed imaging films in PACS.  She is a pleasant lady with no significant past medical history except for hypertension who presented with sudden onset of dizziness which she described as sensation of floating on board and walking off balance like a drunk.  She felt nauseous and threw up.  She went to bed and then she woke up early in the morning to use the bathroom she was still dizzy off balance and felt unsteady on her feet and fell down.  She did not lose consciousness.  She denied significant headache, loss of vision, double vision slurred speech or focal extremity weakness or numbness.  CT scan of the head was  unremarkable but MRI scan showed a tiny punctate right lateral medullary infarct.  Carotid is also multiple old remote is basal ganglia lacunar infarcts noted.  Carotid ultrasound was unremarkable.  CT angiogram or MR angiogram were not obtained.  Transthoracic echo showed normal ejection fraction without cardiac source of embolism.  Hemoglobin A1c was 7.2.  LDL cholesterol was 90 mg percent.  Patient was asked to date on aspirin which she is taking and tolerating well without side effects.  She was also started on Lipitor which she is tolerating well without muscle aches and pains.  She states her gait and balance have improved however states she is still using a cane but because of osteoarthritis in the knees and she plans to have surgery soon.  She has quit smoking completely.  She had previously smoked half pack per day for 40 years.  She has no new complaints.  She feels she is fully recovered from her dizziness and gait ataxia from a stroke in a couple of days after her stroke     ROS:   14 system review of systems is positive for joint pain all other systems negative  PMH:  Past Medical History:  Diagnosis Date  . Hypertension   . Osteoarthritis   . Stroke [facility])     Social History:  Social History   Socioeconomic History  . Marital status: Single    Spouse name: Not on file  . Number of children: Not on file  . Years of education: Not on file  . Highest  education level: Not on file  Occupational History  . Not on file  Tobacco Use  . Smoking status: Former Smoker    Types: Cigarettes    Quit date: 12/30/2018    Years since quitting: 0.3  . Smokeless tobacco: Never Used  Substance and Sexual Activity  . Alcohol use: No  . Drug use: No  . Sexual activity: Not on file  Other Topics Concern  . Not on file  Social History Narrative   Lives with her children   Social Determinants of Health   Financial Resource Strain:   . Difficulty of Paying Living Expenses:   Food  Insecurity:   . Worried About Charity fundraiser in the Last Year:   . Arboriculturist in the Last Year:   Transportation Needs:   . Film/video editor (Medical):   Mar[place] Kitchen Lack of Transportation (Non-Medical):   Physical Activity:   . Days of Exercise per Week:   . Minutes of Exercise per Session:   Stress:   . Feeling of Stress :   Social Connections:   . Frequency of Communication with Friends and Family:   . Frequency of Social Gatherings with Friends and Family:   . Attends Religious Services:   . Active Member of Clubs or Organizations:   . Attends Archivist Meetings:   Marland Kitchen Marital Status:   Intimate Partner Violence:   . Fear of Current or Ex-Partner:   . Emotionally Abused:   Mar[place] Kitchen Physically Abused:   . Sexually Abused:     Medications:   Current Outpatient Medications on File Prior to Visit  Medication Sig Dispense Refill  . aspirin EC 81 MG EC tablet Take 1 tablet (81 mg total) by mouth daily. 30 tablet 0  . atorvastatin (LIPITOR) 40 MG tablet Take 1 tablet (40 mg total) by mouth daily at 6 PM. 30 tablet 0  . blood glucose meter kit and supplies KIT Dispense based on patient and insurance preference. Use up to four times daily as directed. (FOR ICD-9 250.00, 250.01). 1 each 0  . diclofenac sodium (VOLTAREN) 1 % GEL Apply 2 g topically 4 (four) times daily. 1 Tube 0  . lisinopril-hydrochlorothiazide (PRINZIDE,ZESTORETIC) 20-12.5 MG tablet Take 1 tablet by mouth daily.    . metFORMIN (GLUCOPHAGE) 500 MG tablet Take 1 tablet (500 mg total) by mouth daily with breakfast. 30 tablet 0  . nabumetone (RELAFEN) 500 MG tablet Take 500 mg by mouth 2 (two) times daily.     No current facility-administered medications on file prior to visit.    Allergies:  No Known Allergies   SUBJECTIVE:    Vitals Today's Vitals   05/21/19 0850  Height: 5' 3.5" (1.613 m)   Body mass index is 30.86 kg/m.   Physical Exam General: Pleasant middle-aged African-American lady,  seated, in no evident distress Head: head normocephalic and atraumatic.   Neck: supple with no carotid or supraclavicular bruits Cardiovascular: regular rate and rhythm, no murmurs Musculoskeletal: no deformity Skin:  no rash/petichiae Vascular:  Normal pulses all extremities  Neurologic Exam Mental Status: Awake and fully alert.  Normal speech and language.  Oriented to place and time. Recent and remote memory intact. Attention span, concentration and fund of knowledge appropriate. Mood and affect appropriate.  Cranial Nerves: Pupils equal, briskly reactive to light. Extraocular movements full without nystagmus. Visual fields full to confrontation. Hearing intact. Facial sensation intact. Face, tongue, palate moves normally and symmetrically.  Motor: Normal bulk and tone.  Normal strength in all tested extremity muscles. Sensory.: intact to touch , pinprick , position and vibratory sensation.  Coordination: Rapid alternating movements normal in all extremities. Finger-to-nose and heel-to-shin performed accurately bilaterally. Gait and Station: Arises from chair without difficulty. Stance is normal. Gait demonstrates normal stride length and balance without use of assistive device with subjective knee pain bilaterally Reflexes: 1+ and symmetric. Toes downgoing.       ASSESSMENT:   64 year old African-American lady with small right lateral medullary lacunar infarct in November 2020 secondary to small vessel disease.  Vascular risk factors hyperlipidemia, diabetes, smoking and hypertension.  Recovered well from a stroke standpoint without residual deficits     PLAN:  -Continue aspirin 81 mg daily and atorvastatin 40 mg daily for secondary stroke prevention -Continue to follow with PCP for HTN, HLD and DM management -Continue to monitor blood pressure at home -maintain strict control of hypertension with blood pressure goal below 130/90, diabetes with hemoglobin A1c goal below 6.5% and  lipids with LDL cholesterol goal below 70 mg/dL. I also advised the patient to eat a healthy diet with plenty of whole grains, cereals, fruits and vegetables, exercise regularly and maintain ideal body weight   Overall stable from stroke standpoint and follows with PCP regularly therefore recommend follow-up as needed at this time   I spent 22 minutes of face-to-face and non-face-to-face time with patient.  This included previsit chart review, lab review, study review, order entry, electronic health record documentation, patient education regarding prior stroke, importance of managing stroke risk factors and answered all questions to patient satisfaction    Frann Rider, [facility]  [facility] [address] [place], [place] [address]  Phone [phone] Fax [phone] Note: This document was prepared with digital dictation and possible smart phrase technology. Any transcriptional errors that result from this process are unintentional.

## 2019-05-21 NOTE — Progress Notes (Signed)
I agree with the above plan 

## 2019-06-29 DIAGNOSIS — R69 Illness, unspecified: Secondary | ICD-10-CM | POA: Diagnosis not present

## 2019-07-08 DIAGNOSIS — M25562 Pain in left knee: Secondary | ICD-10-CM | POA: Diagnosis not present

## 2019-07-08 DIAGNOSIS — M25561 Pain in right knee: Secondary | ICD-10-CM | POA: Diagnosis not present

## 2019-07-08 DIAGNOSIS — M170 Bilateral primary osteoarthritis of knee: Secondary | ICD-10-CM | POA: Diagnosis not present

## 2019-07-24 DIAGNOSIS — M1711 Unilateral primary osteoarthritis, right knee: Secondary | ICD-10-CM | POA: Diagnosis not present

## 2019-07-24 DIAGNOSIS — M1712 Unilateral primary osteoarthritis, left knee: Secondary | ICD-10-CM | POA: Diagnosis not present

## 2019-09-11 ENCOUNTER — Other Ambulatory Visit: Payer: Medicare HMO

## 2020-03-01 DIAGNOSIS — I10 Essential (primary) hypertension: Secondary | ICD-10-CM | POA: Diagnosis not present

## 2020-03-01 DIAGNOSIS — E1169 Type 2 diabetes mellitus with other specified complication: Secondary | ICD-10-CM | POA: Diagnosis not present

## 2020-03-01 DIAGNOSIS — Z0000 Encounter for general adult medical examination without abnormal findings: Secondary | ICD-10-CM | POA: Diagnosis not present

## 2020-03-01 DIAGNOSIS — Z8673 Personal history of transient ischemic attack (TIA), and cerebral infarction without residual deficits: Secondary | ICD-10-CM | POA: Diagnosis not present

## 2020-03-01 DIAGNOSIS — I739 Peripheral vascular disease, unspecified: Secondary | ICD-10-CM | POA: Diagnosis not present

## 2020-03-01 DIAGNOSIS — Z1159 Encounter for screening for other viral diseases: Secondary | ICD-10-CM | POA: Diagnosis not present

## 2020-03-01 DIAGNOSIS — Z1389 Encounter for screening for other disorder: Secondary | ICD-10-CM | POA: Diagnosis not present

## 2020-03-01 DIAGNOSIS — F17210 Nicotine dependence, cigarettes, uncomplicated: Secondary | ICD-10-CM | POA: Diagnosis not present

## 2020-03-01 DIAGNOSIS — Z89422 Acquired absence of other left toe(s): Secondary | ICD-10-CM | POA: Diagnosis not present

## 2020-03-01 DIAGNOSIS — Z7984 Long term (current) use of oral hypoglycemic drugs: Secondary | ICD-10-CM | POA: Diagnosis not present

## 2020-04-22 DIAGNOSIS — M25562 Pain in left knee: Secondary | ICD-10-CM | POA: Diagnosis not present

## 2020-04-22 DIAGNOSIS — M1711 Unilateral primary osteoarthritis, right knee: Secondary | ICD-10-CM | POA: Diagnosis not present

## 2020-04-22 DIAGNOSIS — M25561 Pain in right knee: Secondary | ICD-10-CM | POA: Diagnosis not present

## 2020-04-22 DIAGNOSIS — M1712 Unilateral primary osteoarthritis, left knee: Secondary | ICD-10-CM | POA: Diagnosis not present

## 2020-05-20 ENCOUNTER — Other Ambulatory Visit: Payer: Self-pay | Admitting: Family Medicine

## 2020-05-20 DIAGNOSIS — Z1231 Encounter for screening mammogram for malignant neoplasm of breast: Secondary | ICD-10-CM

## 2020-06-22 ENCOUNTER — Other Ambulatory Visit: Payer: Self-pay | Admitting: Family Medicine

## 2020-06-22 DIAGNOSIS — N63 Unspecified lump in unspecified breast: Secondary | ICD-10-CM

## 2020-06-25 ENCOUNTER — Ambulatory Visit: Payer: Medicare Other

## 2020-06-25 ENCOUNTER — Other Ambulatory Visit: Payer: Self-pay

## 2020-06-25 ENCOUNTER — Ambulatory Visit
Admission: RE | Admit: 2020-06-25 | Discharge: 2020-06-25 | Disposition: A | Discharge status: Home / Self Care | Payer: Medicare Other | Source: Ambulatory Visit | Attending: Family Medicine | Admitting: Family Medicine

## 2020-06-25 DIAGNOSIS — N63 Unspecified lump in unspecified breast: Secondary | ICD-10-CM

## 2020-07-15 ENCOUNTER — Encounter: Payer: Self-pay | Admitting: Sports Medicine

## 2020-07-15 ENCOUNTER — Ambulatory Visit (INDEPENDENT_AMBULATORY_CARE_PROVIDER_SITE_OTHER): Payer: Medicare Other | Admitting: Sports Medicine

## 2020-07-15 ENCOUNTER — Other Ambulatory Visit: Payer: Self-pay

## 2020-07-15 DIAGNOSIS — M79675 Pain in left toe(s): Secondary | ICD-10-CM

## 2020-07-15 DIAGNOSIS — I739 Peripheral vascular disease, unspecified: Secondary | ICD-10-CM | POA: Diagnosis not present

## 2020-07-15 DIAGNOSIS — E1169 Type 2 diabetes mellitus with other specified complication: Secondary | ICD-10-CM

## 2020-07-15 DIAGNOSIS — B351 Tinea unguium: Secondary | ICD-10-CM | POA: Diagnosis not present

## 2020-07-15 DIAGNOSIS — M79674 Pain in right toe(s): Secondary | ICD-10-CM

## 2020-07-15 DIAGNOSIS — Z89422 Acquired absence of other left toe(s): Secondary | ICD-10-CM

## 2020-07-15 DIAGNOSIS — Z8673 Personal history of transient ischemic attack (TIA), and cerebral infarction without residual deficits: Secondary | ICD-10-CM | POA: Insufficient documentation

## 2020-07-15 DIAGNOSIS — Z720 Tobacco use: Secondary | ICD-10-CM | POA: Insufficient documentation

## 2020-07-15 DIAGNOSIS — E6609 Other obesity due to excess calories: Secondary | ICD-10-CM | POA: Insufficient documentation

## 2020-07-15 DIAGNOSIS — Z860100 Personal history of colon polyps, unspecified: Secondary | ICD-10-CM | POA: Insufficient documentation

## 2020-07-15 MED ORDER — CICLOPIROX 8 % EX SOLN: Freq: Every day | CUTANEOUS | 0 refills | Status: DC

## 2020-07-15 NOTE — Progress Notes (Signed)
Subjective: Ashley Price is a 65 y.o. female patient with history of diabetes who presents to office today complaining of long,mildly painful nails  while ambulating in shoes; unable to trim. Patient states that the glucose reading this morning was no recorded.  Admits to a history of previous toe amputation at the left second toe no other pedal complaints noted. Patient Active Problem List   Diagnosis Date Noted   Obesity due to excess calories 07/15/2020   Peripheral vascular disease ([place]) 07/15/2020   Personal history of colonic polyps 07/15/2020   Personal history of transient ischemic attack (TIA), and cerebral infarction without residual deficits 07/15/2020   Status post amputation of lesser toe of left foot ([place]) 07/15/2020   Tobacco user 07/15/2020   Type 2 diabetes mellitus with other specified complication ([place]) 81/27/5170   Acute ischemic stroke ([place]) 12/23/2018   Stroke due to thrombosis of right vertebral artery ([place]) 12/20/2018   Essential hypertension 12/20/2018   Fall at home, initial encounter 12/20/2018   Stroke due to embolism of right vertebral artery ([place]) 12/20/2018   Osteomyelitis ([place]) 08/17/2018   Cellulitis 08/17/2018   Hypertensive urgency 08/17/2018   Osteoarthritis 08/17/2018   Current Outpatient Medications on File Prior to Visit  Medication Sig Dispense Refill   aspirin EC 81 MG tablet 1 tablet     aspirin EC 81 MG EC tablet Take 1 tablet (81 mg total) by mouth daily. 30 tablet 0   atorvastatin (LIPITOR) 40 MG tablet Take 1 tablet (40 mg total) by mouth daily at 6 PM. 30 tablet 0   atorvastatin (LIPITOR) 40 MG tablet 1 tablet at 6 pm     blood glucose meter kit and supplies KIT Dispense based on patient and insurance preference. Use up to four times daily as directed. (FOR ICD-9 250.00, 250.01). 1 each 0   diclofenac sodium (VOLTAREN) 1 % GEL Apply 2 g topically 4 (four) times daily. 1 Tube 0   lisinopril-hydrochlorothiazide (PRINZIDE,ZESTORETIC) 20-12.5  MG tablet Take 1 tablet by mouth daily.     metFORMIN (GLUCOPHAGE) 500 MG tablet Take 1 tablet (500 mg total) by mouth daily with breakfast. 30 tablet 0   metFORMIN (GLUCOPHAGE) 500 MG tablet 1 tablet with a meal     nabumetone (RELAFEN) 500 MG tablet Take 500 mg by mouth 2 (two) times daily.     No current facility-administered medications on file prior to visit.   No Known Allergies  No results found for this or any previous visit (from the past 2160 hour(s)).  Objective: General: Patient is awake, alert, and oriented x 3 and in no acute distress.  Integument: Skin is warm, dry and supple bilateral. Nails are tender, long, thickened and  dystrophic with subungual debris, consistent with onychomycosis, 1-5 right and 1,3-5 on left No signs of infection. No open lesions or preulcerative lesions present bilateral. Remaining integument unremarkable.  Vasculature:  Dorsalis Pedis pulse 1/4 bilateral. Posterior Tibial pulse  1/4 bilateral.  Capillary fill time <5 sec 1-5 bilateral.  No hair growth to the level of the digits. Temperature gradient within normal limits. No varicosities present bilateral. No edema present bilateral.   Neurology: The patient has diminished sensation bilateral assist with Semmes Weinstein monofilament and tuning fork..   Musculoskeletal: Status post left second toe amputation area well-healed.  Assessment and Plan: Problem List Items Addressed This Visit       Cardiovascular and Mediastinum   Peripheral vascular disease ([place])   Relevant Medications   aspirin EC  81 MG tablet   atorvastatin (LIPITOR) 40 MG tablet     Endocrine   Type 2 diabetes mellitus with other specified complication ([facility])   Relevant Medications   aspirin EC 81 MG tablet   atorvastatin (LIPITOR) 40 MG tablet   metFORMIN (GLUCOPHAGE) 500 MG tablet   Other Visit Diagnoses     Pain due to onychomycosis of toenails of both feet    -  Primary   Relevant Medications   ciclopirox  (PENLAC) 8 % solution   History of amputation of lesser toe of left foot ([place])           -Examined patient. -Discussed and educated patient on diabetic foot care, especially with  regards to the vascular, neurological and musculoskeletal systems.  -Stressed the importance of good glycemic control and the detriment of not  controlling glucose levels in relation to the foot. -Mechanically debrided all nails 1-5 rignt and 1,3-5 on left using sterile nail nipper and filed with dremel without incident  -Rx Penlac to use as directed -Answered all patient questions -Patient to return  in 3 months for at risk foot care -Patient advised to call the office if any problems or questions arise in the meantime.  Landis Martins, DPM

## 2020-08-27 DIAGNOSIS — F17210 Nicotine dependence, cigarettes, uncomplicated: Secondary | ICD-10-CM | POA: Diagnosis not present

## 2020-08-27 DIAGNOSIS — E1169 Type 2 diabetes mellitus with other specified complication: Secondary | ICD-10-CM | POA: Diagnosis not present

## 2020-08-27 DIAGNOSIS — I739 Peripheral vascular disease, unspecified: Secondary | ICD-10-CM | POA: Diagnosis not present

## 2020-08-27 DIAGNOSIS — Z1389 Encounter for screening for other disorder: Secondary | ICD-10-CM | POA: Diagnosis not present

## 2020-08-27 DIAGNOSIS — Z7984 Long term (current) use of oral hypoglycemic drugs: Secondary | ICD-10-CM | POA: Diagnosis not present

## 2020-08-27 DIAGNOSIS — I10 Essential (primary) hypertension: Secondary | ICD-10-CM | POA: Diagnosis not present

## 2020-10-21 ENCOUNTER — Ambulatory Visit: Payer: Medicare Other | Admitting: Sports Medicine

## 2020-11-09 IMAGING — MG DIGITAL DIAGNOSTIC BILAT W/ TOMO W/ CAD
6 of 12 series · 6 of 36 positions shown · non-contrast
Comparison: Baseline mammogram 03/05/2019.

CLINICAL DATA: Recall from baseline screening mammography with
tomosynthesis, possible mass involving the INNER RIGHT breast at
ANTERIOR depth and possible asymmetry involving the INNER LEFT
breast at MIDDLE depth which was visible only on the CC images.

EXAM:
DIGITAL DIAGNOSTIC BILATERAL MAMMOGRAM WITH CAD
LIMITED ULTRASOUND BILATERAL BREASTS

[L CC synth-2D (1 of 2)]
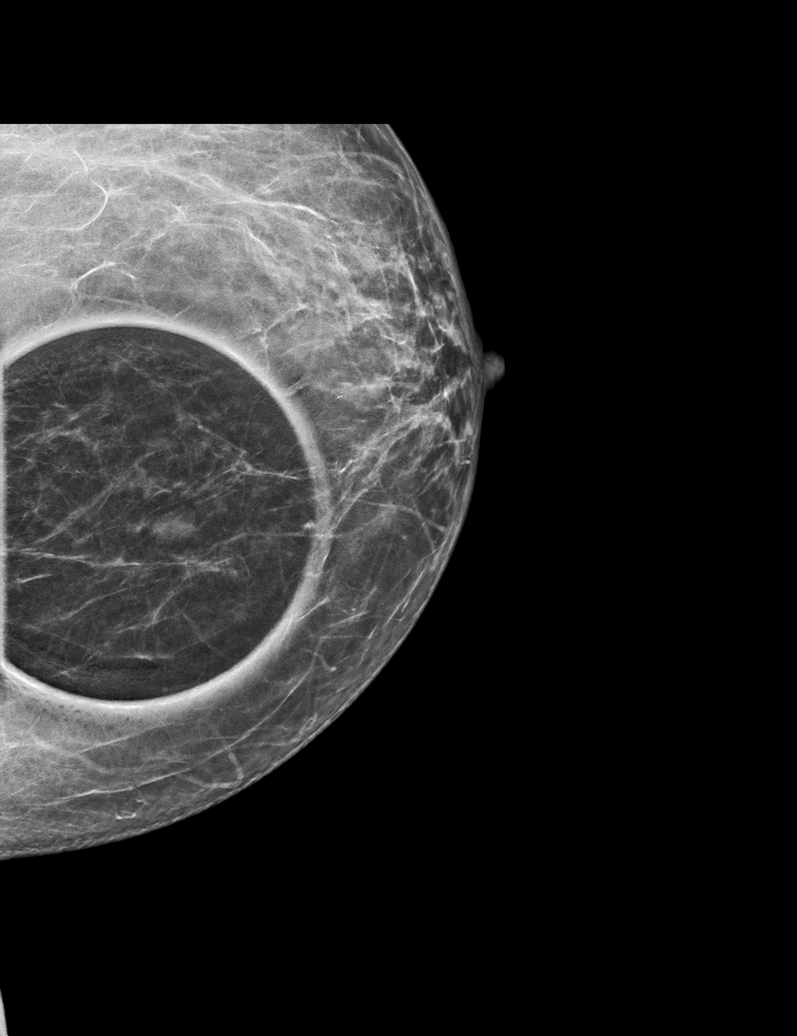

[R MLO synth-2D]
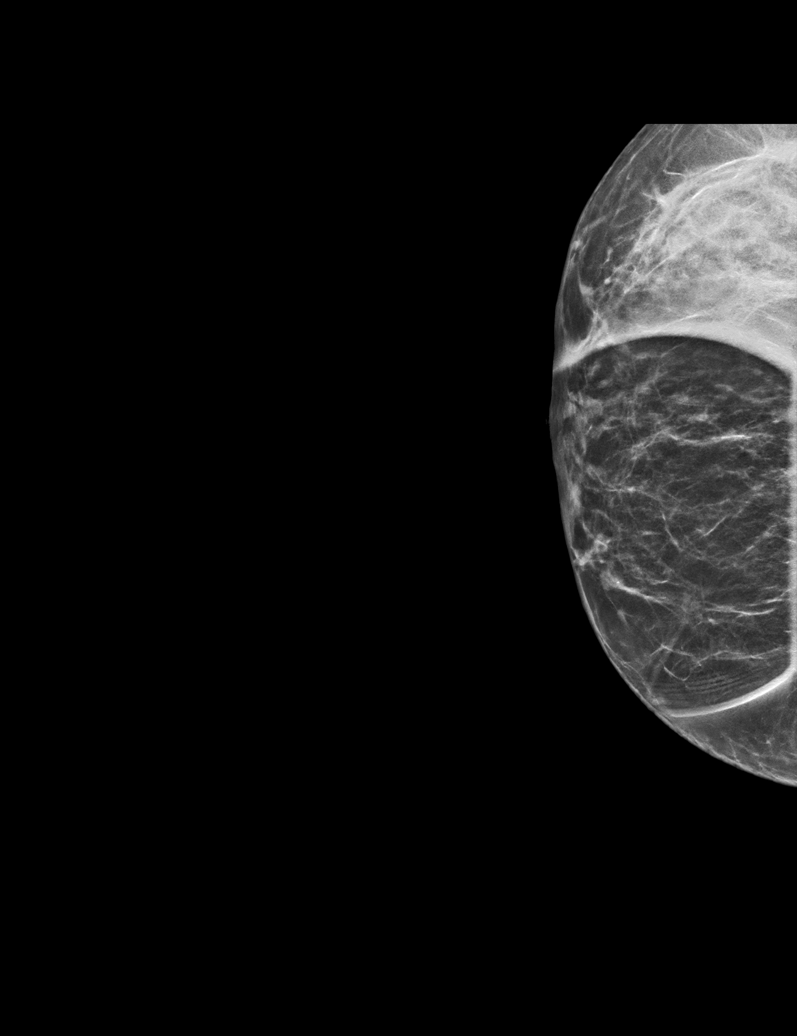

[L ML synth-2D (1 of 2)]
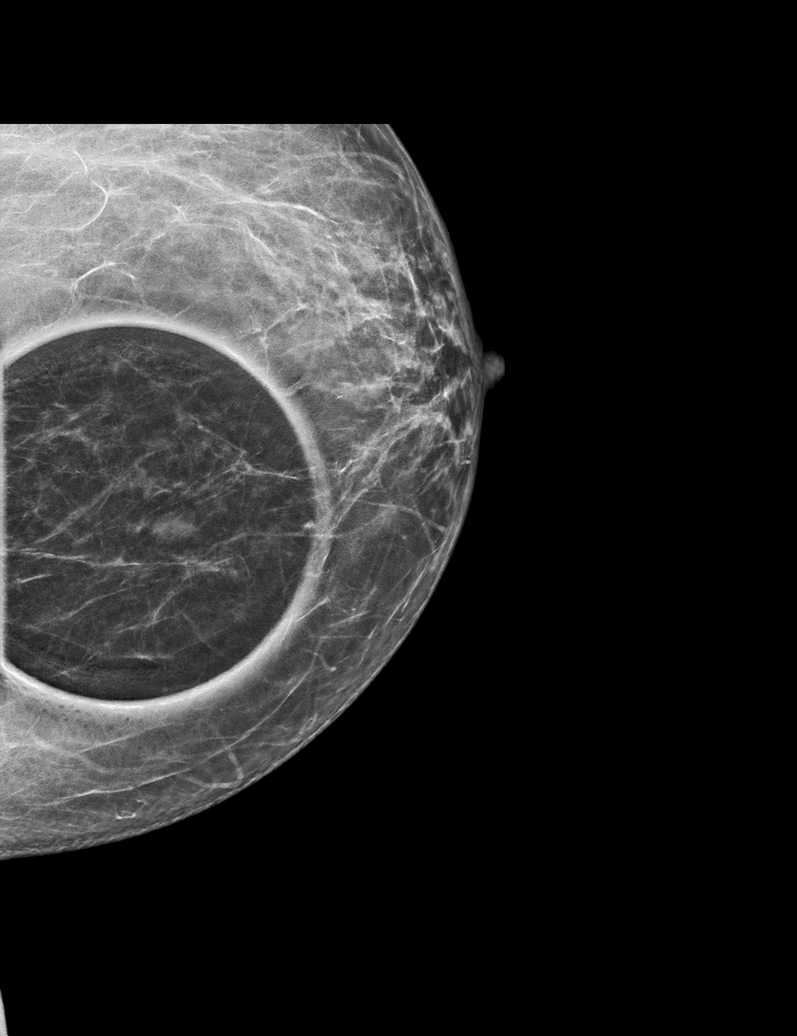

[L CC synth-2D (2 of 2)]
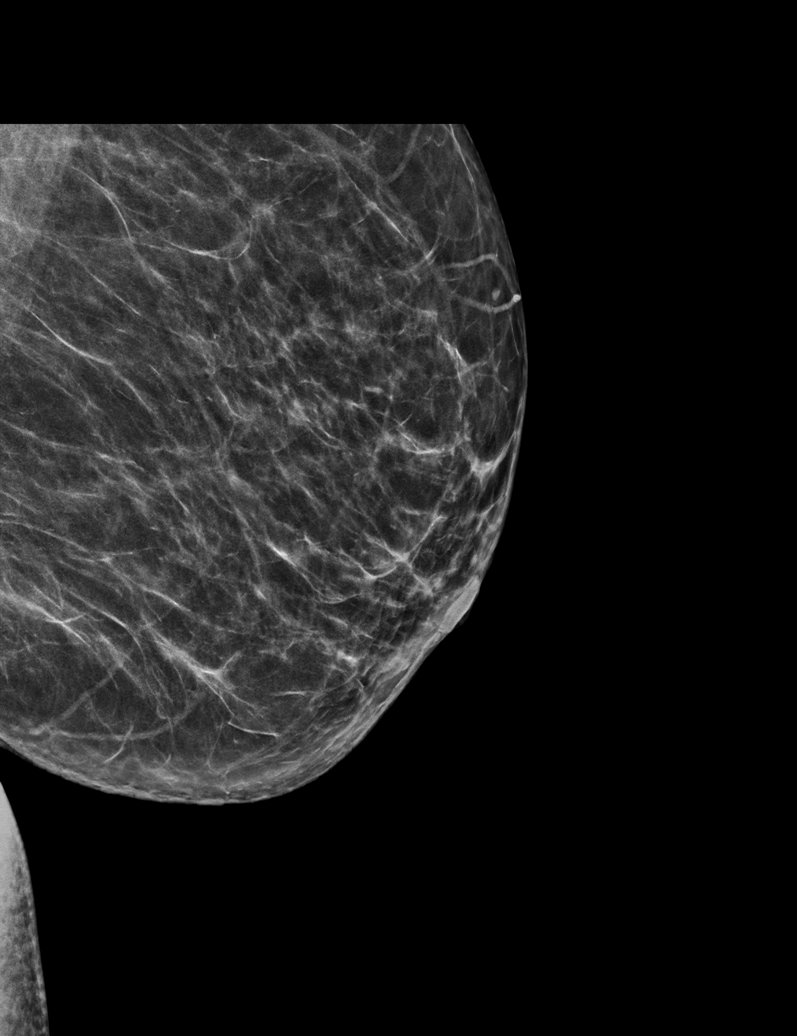

[R CC synth-2D]
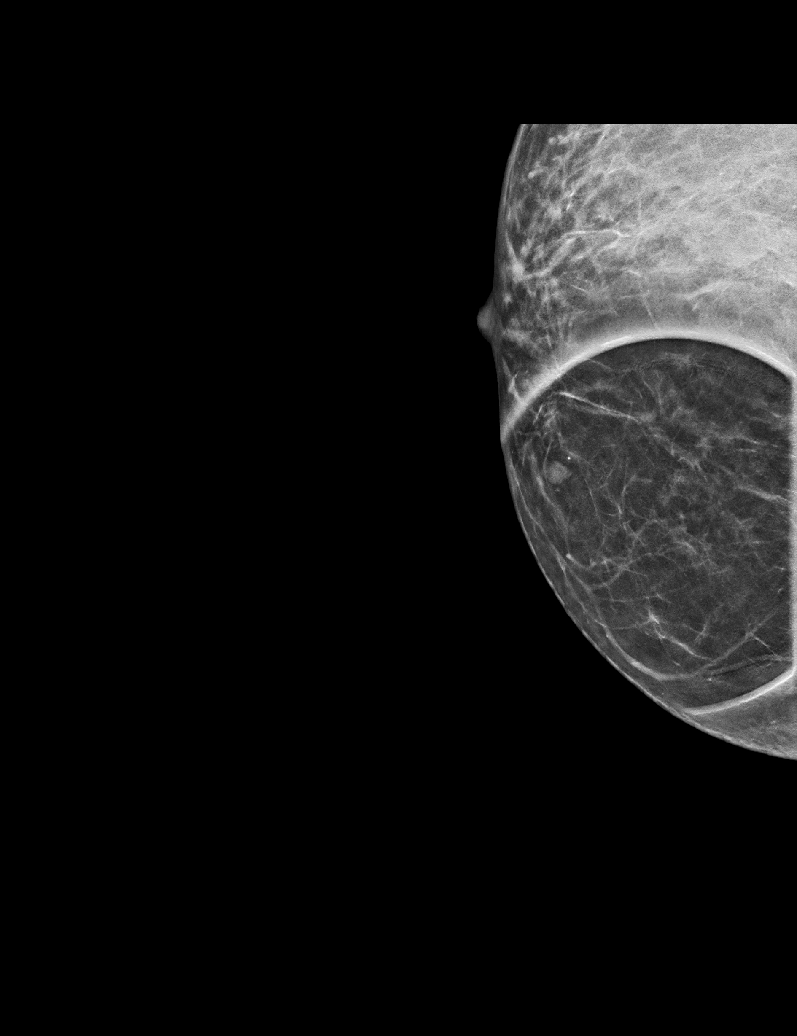

[L ML synth-2D (2 of 2)]
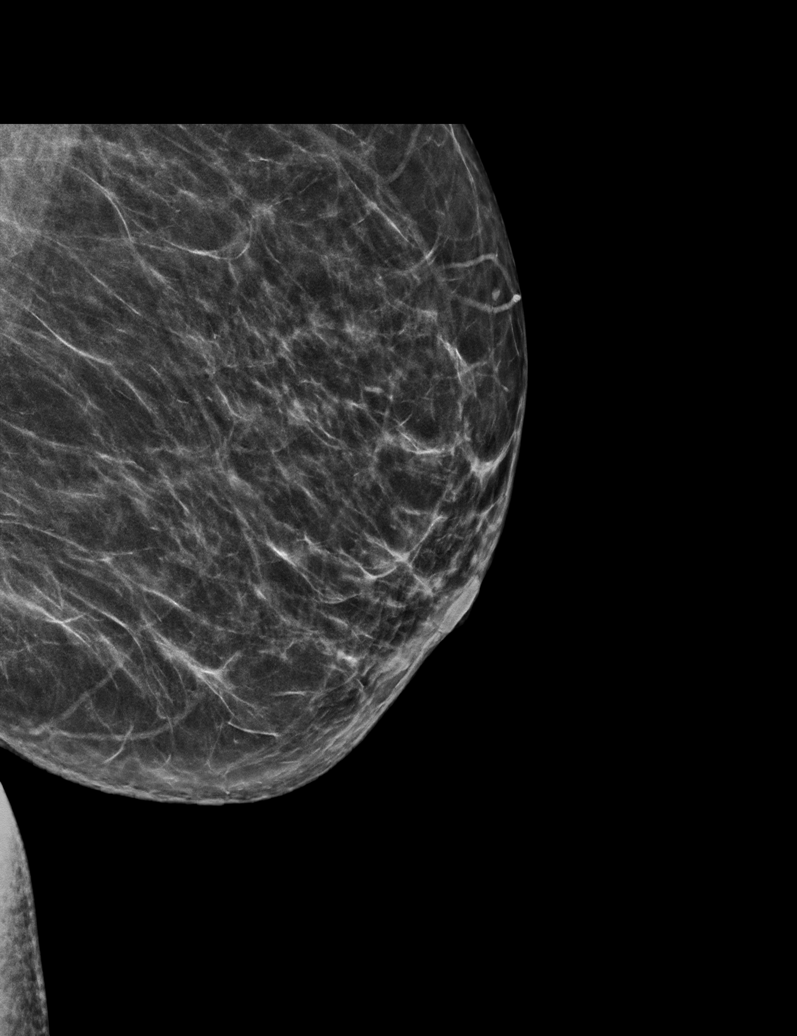

[6 of 36 positions shown; findings below may reference images not displayed]

No prior ultrasound.

ACR Breast Density Category b: There are scattered areas of
fibroglandular density.
FINDINGS: RIGHT: Tomosynthesis and synthesized spot-compression CC and MLO
views of the area of concern in the RIGHT breast were obtained.

These confirm a circumscribed isodense mass measuring approximately
6 mm in the INNER breast at ANTERIOR depth. There is no associated
architectural distortion or suspicious calcifications. A benign
punctate calcification is present in the tissues immediately
adjacent to the mass.

LEFT: Tomosynthesis and synthesized spot-compression CC view of the
area of concern in the LEFT breast and a tomosynthesis and
synthesized full field mediolateral view of the LEFT breast were
obtained.

The asymmetry in the INNER breast partially disperses with
compression, measuring approximately 1 cm in size. There is no
associated architectural distortion or suspicious calcifications.
The mass localizes to the UPPER breast on full field mediolateral
images, indicating its location in the UPPER INNER QUADRANT.

The full field mediolateral image of the LEFT breast was processed
with CAD.

Targeted RIGHT breast ultrasound is performed, showing a benign
simple cyst at the 3 o'clock position approximately 2 cm from nipple
at ANTERIOR depth, measuring approximately 3 x 5 x 4 mm,
demonstrating posterior acoustic enhancement and no internal power
Doppler flow, corresponding to the screening mammographic finding.
No suspicious solid mass or abnormal acoustic shadowing is
identified.

Targeted LEFT breast ultrasound is performed, showing circumscribed
oval parallel likely benign clustered cysts at the 10 o'clock
position approximately 6 cm from nipple at MIDDLE depth, measuring
approximately 4 x 10 x 6 mm, demonstrating posterior acoustic
enhancement and no internal power Doppler flow, corresponding to the
screening mammographic finding.
IMPRESSION: 1. Likely benign clustered cysts/apocrine metaplasia involving the
UPPER INNER QUADRANT of the LEFT breast at the 10 o'clock position
approximately 6 cm from nipple which accounts for the screening
mammographic finding.
2. Benign simple cyst in the INNER RIGHT breast which accounts for
the screening mammographic finding.

RECOMMENDATION:
LEFT breast ultrasound in 6 months.

I have discussed the findings and recommendations with the patient.
If applicable, a reminder letter will be sent to the patient
regarding the next appointment.

BI-RADS CATEGORY  3: Probably benign.

## 2020-11-09 IMAGING — US US BREAST*R* LIMITED INC AXILLA
1 series · 6 of 6 positions shown · non-contrast
Comparison: Baseline mammogram 03/05/2019.

CLINICAL DATA: Recall from baseline screening mammography with
tomosynthesis, possible mass involving the INNER RIGHT breast at
ANTERIOR depth and possible asymmetry involving the INNER LEFT
breast at MIDDLE depth which was visible only on the CC images.

EXAM:
DIGITAL DIAGNOSTIC BILATERAL MAMMOGRAM WITH CAD
LIMITED ULTRASOUND BILATERAL BREASTS

[Series 1: us breast*right* limited inc axilla · 0.05mm/px · 6 of 6 slices shown]
[im 1/6]
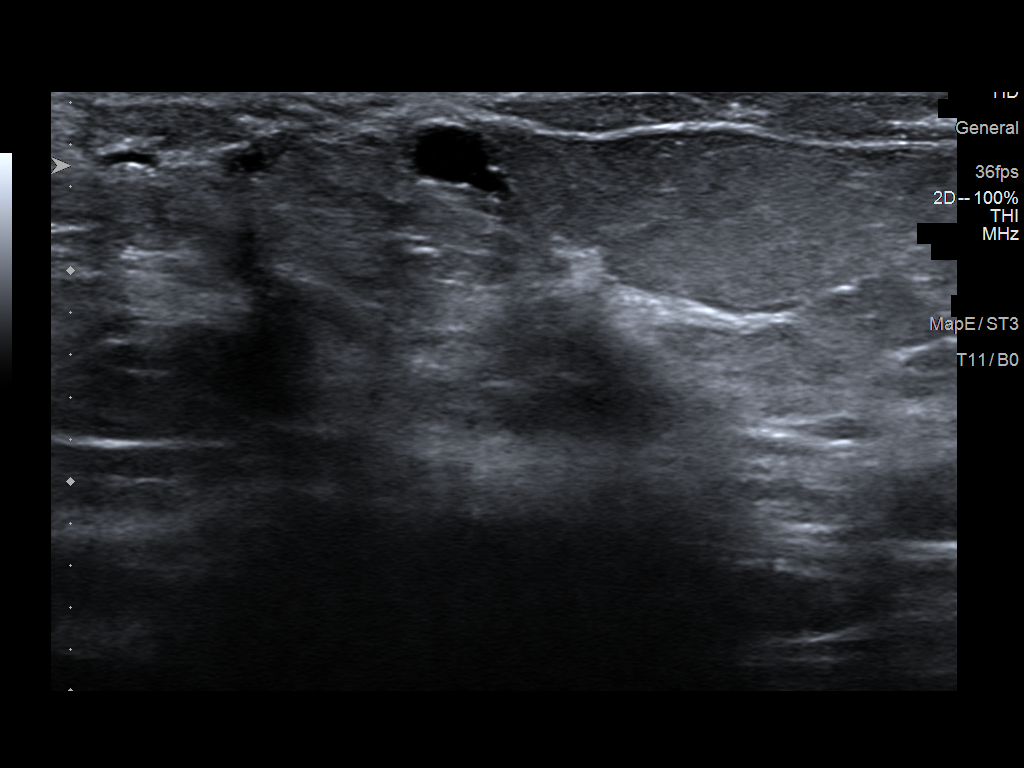
[im 2/6]
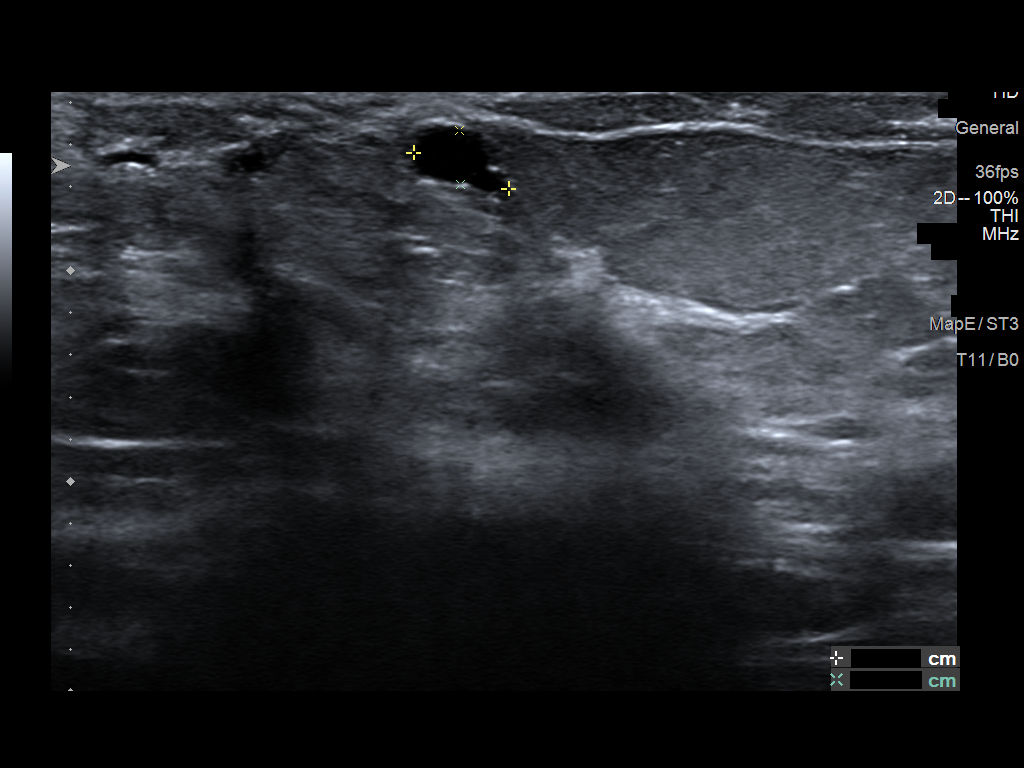
[im 3/6]
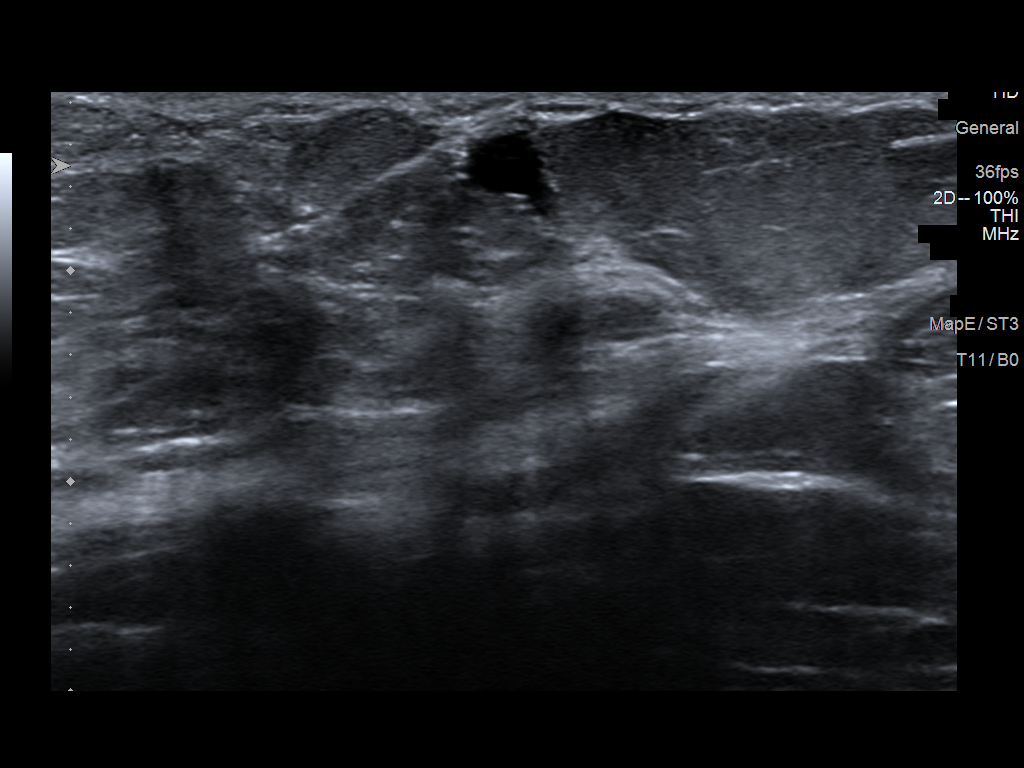
[im 4/6]
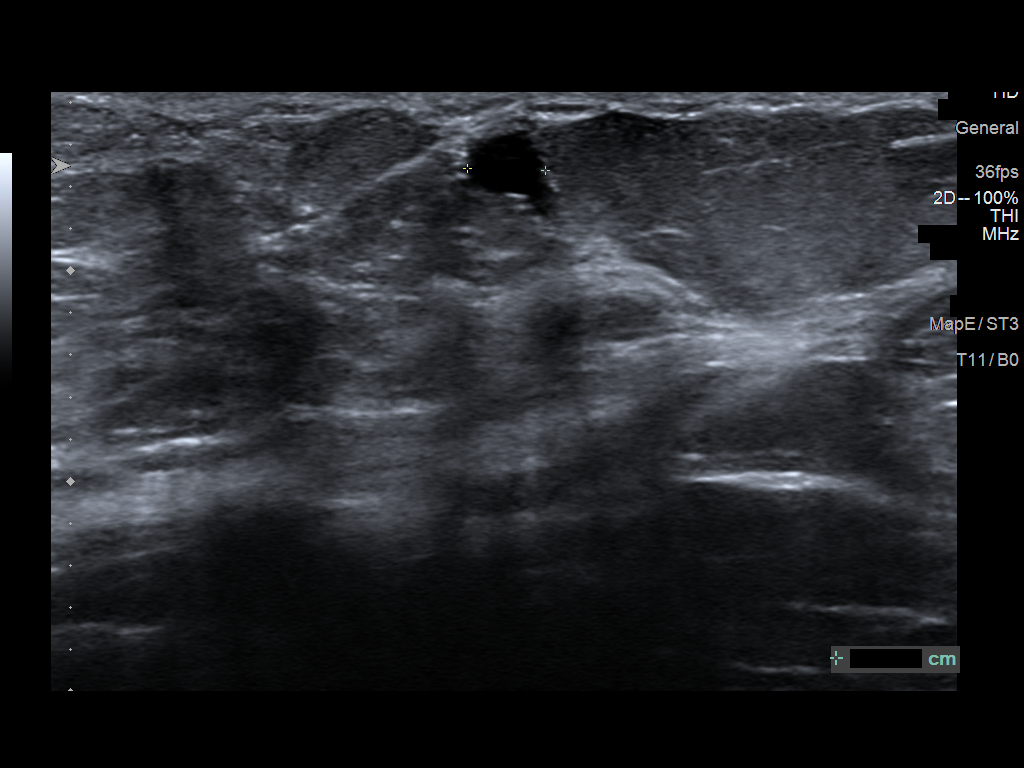
[im 5/6]
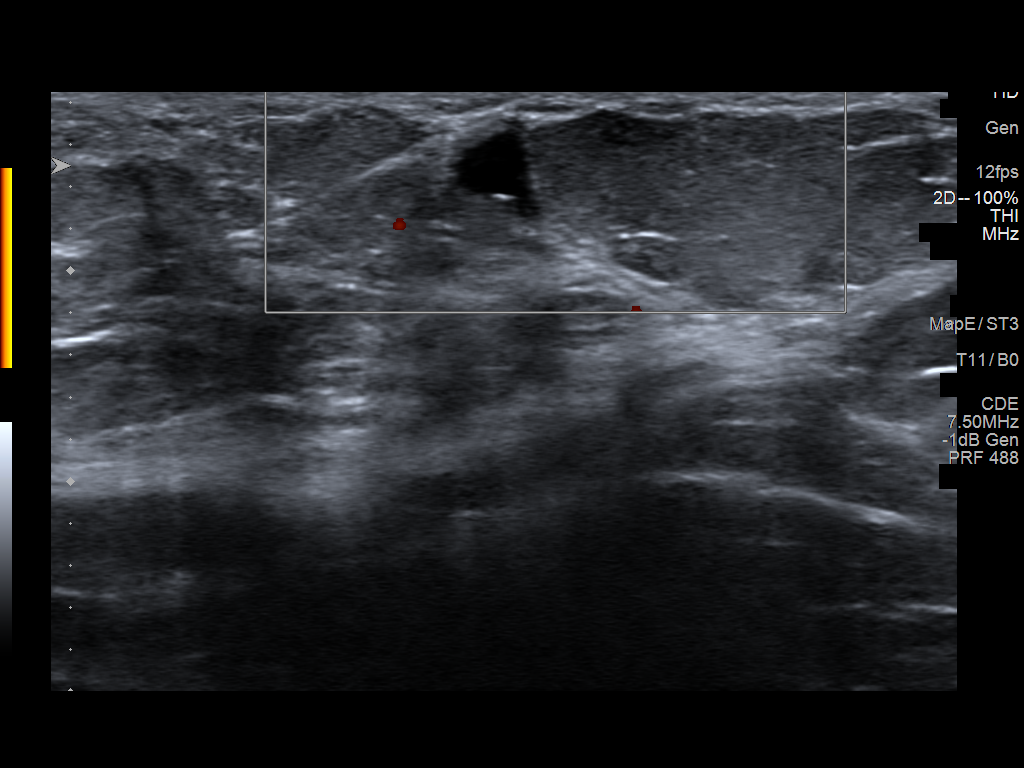
[im 6/6]
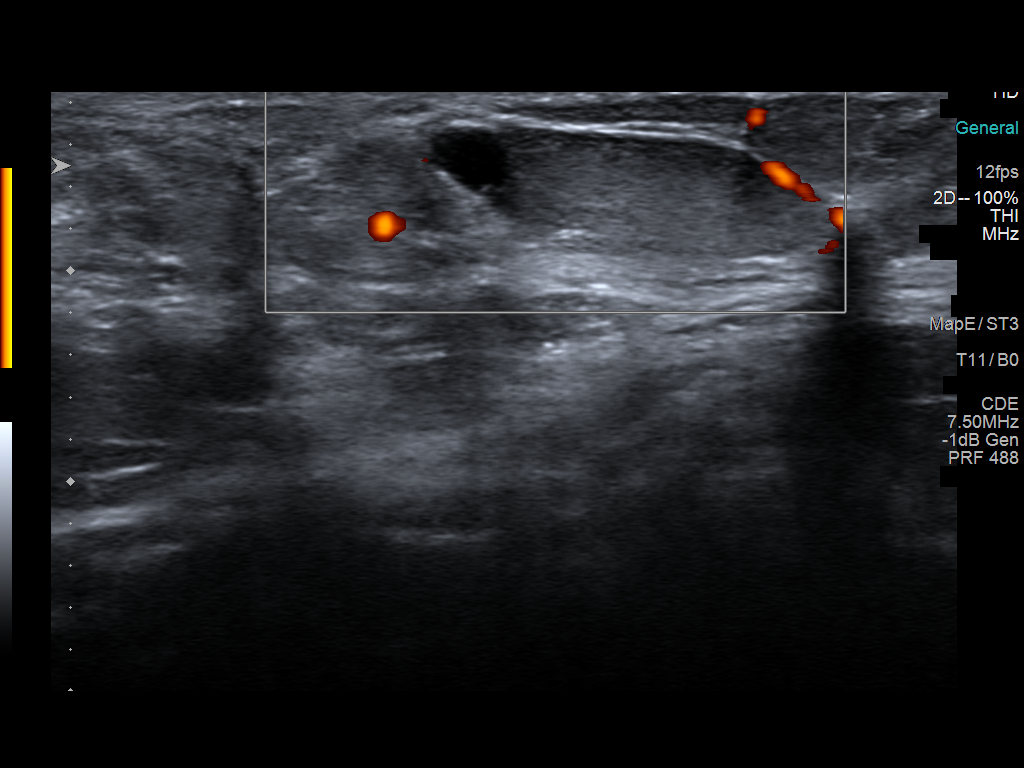

[6 of 6 positions shown; findings below may reference images not displayed]

No prior ultrasound.

ACR Breast Density Category b: There are scattered areas of
fibroglandular density.
FINDINGS: RIGHT: Tomosynthesis and synthesized spot-compression CC and MLO
views of the area of concern in the RIGHT breast were obtained.

These confirm a circumscribed isodense mass measuring approximately
6 mm in the INNER breast at ANTERIOR depth. There is no associated
architectural distortion or suspicious calcifications. A benign
punctate calcification is present in the tissues immediately
adjacent to the mass.

LEFT: Tomosynthesis and synthesized spot-compression CC view of the
area of concern in the LEFT breast and a tomosynthesis and
synthesized full field mediolateral view of the LEFT breast were
obtained.

The asymmetry in the INNER breast partially disperses with
compression, measuring approximately 1 cm in size. There is no
associated architectural distortion or suspicious calcifications.
The mass localizes to the UPPER breast on full field mediolateral
images, indicating its location in the UPPER INNER QUADRANT.

The full field mediolateral image of the LEFT breast was processed
with CAD.

Targeted RIGHT breast ultrasound is performed, showing a benign
simple cyst at the 3 o'clock position approximately 2 cm from nipple
at ANTERIOR depth, measuring approximately 3 x 5 x 4 mm,
demonstrating posterior acoustic enhancement and no internal power
Doppler flow, corresponding to the screening mammographic finding.
No suspicious solid mass or abnormal acoustic shadowing is
identified.

Targeted LEFT breast ultrasound is performed, showing circumscribed
oval parallel likely benign clustered cysts at the 10 o'clock
position approximately 6 cm from nipple at MIDDLE depth, measuring
approximately 4 x 10 x 6 mm, demonstrating posterior acoustic
enhancement and no internal power Doppler flow, corresponding to the
screening mammographic finding.
IMPRESSION: 1. Likely benign clustered cysts/apocrine metaplasia involving the
UPPER INNER QUADRANT of the LEFT breast at the 10 o'clock position
approximately 6 cm from nipple which accounts for the screening
mammographic finding.
2. Benign simple cyst in the INNER RIGHT breast which accounts for
the screening mammographic finding.

RECOMMENDATION:
LEFT breast ultrasound in 6 months.

I have discussed the findings and recommendations with the patient.
If applicable, a reminder letter will be sent to the patient
regarding the next appointment.

BI-RADS CATEGORY  3: Probably benign.

## 2021-03-18 DIAGNOSIS — E1140 Type 2 diabetes mellitus with diabetic neuropathy, unspecified: Secondary | ICD-10-CM | POA: Diagnosis not present

## 2021-03-18 DIAGNOSIS — I10 Essential (primary) hypertension: Secondary | ICD-10-CM | POA: Diagnosis not present

## 2021-03-18 DIAGNOSIS — Z89422 Acquired absence of other left toe(s): Secondary | ICD-10-CM | POA: Diagnosis not present

## 2021-03-18 DIAGNOSIS — Z7984 Long term (current) use of oral hypoglycemic drugs: Secondary | ICD-10-CM | POA: Diagnosis not present

## 2021-03-18 DIAGNOSIS — Z0000 Encounter for general adult medical examination without abnormal findings: Secondary | ICD-10-CM | POA: Diagnosis not present

## 2021-03-18 DIAGNOSIS — Z8673 Personal history of transient ischemic attack (TIA), and cerebral infarction without residual deficits: Secondary | ICD-10-CM | POA: Diagnosis not present

## 2021-03-18 DIAGNOSIS — Z1389 Encounter for screening for other disorder: Secondary | ICD-10-CM | POA: Diagnosis not present

## 2021-03-18 DIAGNOSIS — I739 Peripheral vascular disease, unspecified: Secondary | ICD-10-CM | POA: Diagnosis not present

## 2021-03-18 DIAGNOSIS — L723 Sebaceous cyst: Secondary | ICD-10-CM | POA: Diagnosis not present

## 2021-03-18 DIAGNOSIS — F17210 Nicotine dependence, cigarettes, uncomplicated: Secondary | ICD-10-CM | POA: Diagnosis not present

## 2021-03-24 ENCOUNTER — Other Ambulatory Visit: Payer: Self-pay | Admitting: Family Medicine

## 2021-03-24 DIAGNOSIS — E2839 Other primary ovarian failure: Secondary | ICD-10-CM

## 2021-07-08 DIAGNOSIS — L02229 Furuncle of trunk, unspecified: Secondary | ICD-10-CM | POA: Diagnosis not present

## 2021-07-08 DIAGNOSIS — B9689 Other specified bacterial agents as the cause of diseases classified elsewhere: Secondary | ICD-10-CM | POA: Diagnosis not present

## 2021-07-18 DIAGNOSIS — E1140 Type 2 diabetes mellitus with diabetic neuropathy, unspecified: Secondary | ICD-10-CM | POA: Diagnosis not present

## 2021-07-18 DIAGNOSIS — R634 Abnormal weight loss: Secondary | ICD-10-CM | POA: Diagnosis not present

## 2021-07-18 DIAGNOSIS — R413 Other amnesia: Secondary | ICD-10-CM | POA: Diagnosis not present

## 2021-07-18 DIAGNOSIS — I10 Essential (primary) hypertension: Secondary | ICD-10-CM | POA: Diagnosis not present

## 2021-07-18 DIAGNOSIS — E440 Moderate protein-calorie malnutrition: Secondary | ICD-10-CM | POA: Diagnosis not present

## 2021-07-29 ENCOUNTER — Other Ambulatory Visit: Payer: Self-pay

## 2021-07-29 ENCOUNTER — Emergency Department (HOSPITAL_COMMUNITY): Payer: Medicare Other

## 2021-07-29 ENCOUNTER — Encounter (HOSPITAL_COMMUNITY)
Admission: EM | Disposition: A | Discharge status: Home / Self Care | Payer: Self-pay | Source: Home / Self Care | Attending: Vascular Surgery

## 2021-07-29 ENCOUNTER — Emergency Department (HOSPITAL_COMMUNITY): Payer: Medicare Other | Admitting: Certified Registered Nurse Anesthetist

## 2021-07-29 ENCOUNTER — Emergency Department (EMERGENCY_DEPARTMENT_HOSPITAL): Payer: Medicare Other | Admitting: Certified Registered Nurse Anesthetist

## 2021-07-29 ENCOUNTER — Inpatient Hospital Stay (HOSPITAL_COMMUNITY)
Admission: EM | Admit: 2021-07-29 | Discharge: 2021-07-31 | DRG: 271 | Disposition: A | Discharge status: Home / Self Care | Payer: Medicare Other | Attending: Vascular Surgery | Admitting: Vascular Surgery

## 2021-07-29 ENCOUNTER — Inpatient Hospital Stay (HOSPITAL_COMMUNITY): Payer: Medicare Other

## 2021-07-29 ENCOUNTER — Encounter (HOSPITAL_COMMUNITY): Payer: Self-pay

## 2021-07-29 DIAGNOSIS — Z803 Family history of malignant neoplasm of breast: Secondary | ICD-10-CM

## 2021-07-29 DIAGNOSIS — R52 Pain, unspecified: Secondary | ICD-10-CM

## 2021-07-29 DIAGNOSIS — Z79899 Other long term (current) drug therapy: Secondary | ICD-10-CM

## 2021-07-29 DIAGNOSIS — I80292 Phlebitis and thrombophlebitis of other deep vessels of left lower extremity: Secondary | ICD-10-CM | POA: Diagnosis not present

## 2021-07-29 DIAGNOSIS — I82492 Acute embolism and thrombosis of other specified deep vein of left lower extremity: Secondary | ICD-10-CM | POA: Diagnosis not present

## 2021-07-29 DIAGNOSIS — Z7984 Long term (current) use of oral hypoglycemic drugs: Secondary | ICD-10-CM | POA: Diagnosis not present

## 2021-07-29 DIAGNOSIS — Z841 Family history of disorders of kidney and ureter: Secondary | ICD-10-CM

## 2021-07-29 DIAGNOSIS — I10 Essential (primary) hypertension: Secondary | ICD-10-CM | POA: Diagnosis not present

## 2021-07-29 DIAGNOSIS — I361 Nonrheumatic tricuspid (valve) insufficiency: Secondary | ICD-10-CM | POA: Diagnosis not present

## 2021-07-29 DIAGNOSIS — M7989 Other specified soft tissue disorders: Secondary | ICD-10-CM

## 2021-07-29 DIAGNOSIS — R000 Tachycardia, unspecified: Secondary | ICD-10-CM | POA: Diagnosis not present

## 2021-07-29 DIAGNOSIS — L538 Other specified erythematous conditions: Secondary | ICD-10-CM

## 2021-07-29 DIAGNOSIS — Z8673 Personal history of transient ischemic attack (TIA), and cerebral infarction without residual deficits: Secondary | ICD-10-CM | POA: Diagnosis not present

## 2021-07-29 DIAGNOSIS — Z89422 Acquired absence of other left toe(s): Secondary | ICD-10-CM | POA: Diagnosis not present

## 2021-07-29 DIAGNOSIS — I80202 Phlebitis and thrombophlebitis of unspecified deep vessels of left lower extremity: Secondary | ICD-10-CM | POA: Diagnosis not present

## 2021-07-29 DIAGNOSIS — I8290 Acute embolism and thrombosis of unspecified vein: Secondary | ICD-10-CM | POA: Diagnosis present

## 2021-07-29 DIAGNOSIS — Z7982 Long term (current) use of aspirin: Secondary | ICD-10-CM

## 2021-07-29 DIAGNOSIS — I82442 Acute embolism and thrombosis of left tibial vein: Secondary | ICD-10-CM | POA: Diagnosis present

## 2021-07-29 DIAGNOSIS — I82432 Acute embolism and thrombosis of left popliteal vein: Secondary | ICD-10-CM | POA: Diagnosis present

## 2021-07-29 DIAGNOSIS — E1151 Type 2 diabetes mellitus with diabetic peripheral angiopathy without gangrene: Secondary | ICD-10-CM

## 2021-07-29 DIAGNOSIS — I824Y2 Acute embolism and thrombosis of unspecified deep veins of left proximal lower extremity: Secondary | ICD-10-CM

## 2021-07-29 DIAGNOSIS — R5381 Other malaise: Secondary | ICD-10-CM | POA: Diagnosis present

## 2021-07-29 DIAGNOSIS — I82412 Acute embolism and thrombosis of left femoral vein: Secondary | ICD-10-CM | POA: Diagnosis present

## 2021-07-29 DIAGNOSIS — Z87891 Personal history of nicotine dependence: Secondary | ICD-10-CM | POA: Diagnosis not present

## 2021-07-29 DIAGNOSIS — R531 Weakness: Secondary | ICD-10-CM

## 2021-07-29 DIAGNOSIS — M1990 Unspecified osteoarthritis, unspecified site: Secondary | ICD-10-CM | POA: Diagnosis not present

## 2021-07-29 HISTORY — PX: PERCUTANEOUS VENOUS THROMBECTOMY,LYSIS WITH INTRAVASCULAR ULTRASOUND (IVUS): SHX6751

## 2021-07-29 LAB — COMPREHENSIVE METABOLIC PANEL
ALT: 15 U/L (ref 0–44)
AST: 20 U/L (ref 15–41)
Albumin: 3.2 g/dL — ABNORMAL LOW (ref 3.5–5.0)
Alkaline Phosphatase: 75 U/L (ref 38–126)
Anion gap: 17 — ABNORMAL HIGH (ref 5–15)
BUN: 15 mg/dL (ref 8–23)
CO2: 19 mmol/L — ABNORMAL LOW (ref 22–32)
Calcium: 9.6 mg/dL (ref 8.9–10.3)
Chloride: 98 mmol/L (ref 98–111)
Creatinine, Ser: 1.14 mg/dL — ABNORMAL HIGH (ref 0.44–1.00)
GFR, Estimated: 53 mL/min — ABNORMAL LOW (ref >60)
Glucose, Bld: 207 mg/dL — ABNORMAL HIGH (ref 70–99)
Potassium: 3.8 mmol/L (ref 3.5–5.1)
Sodium: 134 mmol/L — ABNORMAL LOW (ref 135–145)
Total Bilirubin: 0.7 mg/dL (ref 0.3–1.2)
Total Protein: 8.5 g/dL — ABNORMAL HIGH (ref 6.5–8.1)

## 2021-07-29 LAB — GLUCOSE, CAPILLARY: Glucose-Capillary: 148 mg/dL — ABNORMAL HIGH (ref 70–99)

## 2021-07-29 LAB — CBC
HCT: 42.5 % (ref 36.0–46.0)
HCT: 32.2 % — ABNORMAL LOW (ref 36.0–46.0)
Hemoglobin: 13.6 g/dL (ref 12.0–15.0)
Hemoglobin: 11.4 g/dL — ABNORMAL LOW (ref 12.0–15.0)
MCH: 27.9 pg (ref 26.0–34.0)
MCH: 29.5 pg (ref 26.0–34.0)
MCHC: 32 g/dL (ref 30.0–36.0)
MCHC: 35.4 g/dL (ref 30.0–36.0)
MCV: 87.3 fL (ref 80.0–100.0)
MCV: 83.4 fL (ref 80.0–100.0)
Platelets: 260 10*3/uL (ref 150–400)
Platelets: 229 10*3/uL (ref 150–400)
RBC: 4.87 MIL/uL (ref 3.87–5.11)
RBC: 3.86 MIL/uL — ABNORMAL LOW (ref 3.87–5.11)
RDW: 14.1 % (ref 11.5–15.5)
RDW: 14 % (ref 11.5–15.5)
WBC: 13.9 10*3/uL — ABNORMAL HIGH (ref 4.0–10.5)
WBC: 19 10*3/uL — ABNORMAL HIGH (ref 4.0–10.5)
nRBC: 0 % (ref 0.0–0.2)
nRBC: 0 % (ref 0.0–0.2)

## 2021-07-29 LAB — LACTIC ACID, PLASMA: Lactic Acid, Venous: 3.6 mmol/L (ref 0.5–1.9)

## 2021-07-29 LAB — PROTIME-INR
INR: 1.2 (ref 0.8–1.2)
Prothrombin Time: 15.4 seconds — ABNORMAL HIGH (ref 11.4–15.2)

## 2021-07-29 LAB — TSH: TSH: 2.223 u[IU]/mL (ref 0.350–4.500)

## 2021-07-29 LAB — MAGNESIUM
Magnesium: 2.1 mg/dL (ref 1.7–2.4)
Magnesium: 1.9 mg/dL (ref 1.7–2.4)

## 2021-07-29 SURGERY — THROMBECTOMY, VEIN, PERCUTANEOUS
Anesthesia: General | Site: Leg Lower | Laterality: Left

## 2021-07-29 MED ORDER — MIDAZOLAM HCL 2 MG/2ML IJ SOLN
INTRAMUSCULAR | Status: DC | PRN
  Administered 2021-07-29: 2 mg via INTRAVENOUS

## 2021-07-29 MED ORDER — ONDANSETRON HCL 4 MG/2ML IJ SOLN
INTRAMUSCULAR | Status: AC
  Filled 2021-07-29: qty 2

## 2021-07-29 MED ORDER — LIDOCAINE 2% (20 MG/ML) 5 ML SYRINGE
INTRAMUSCULAR | Status: AC
  Filled 2021-07-29: qty 5

## 2021-07-29 MED ORDER — SODIUM CHLORIDE 0.9% FLUSH
3.0000 mL | INTRAVENOUS | Status: DC | PRN
Start: 2021-07-30 — End: 2021-07-31

## 2021-07-29 MED ORDER — SODIUM CHLORIDE 0.9 % WEIGHT BASED INFUSION
1.0000 mL/kg/h | INTRAVENOUS | Status: DC
  Administered 2021-07-29: 1 mL/kg/h via INTRAVENOUS

## 2021-07-29 MED ORDER — LACTATED RINGERS IV SOLN: INTRAVENOUS | Status: DC | PRN

## 2021-07-29 MED ORDER — PROPOFOL 10 MG/ML IV BOLUS
INTRAVENOUS | Status: AC
  Filled 2021-07-29: qty 20

## 2021-07-29 MED ORDER — LIDOCAINE 2% (20 MG/ML) 5 ML SYRINGE
INTRAMUSCULAR | Status: DC | PRN
  Administered 2021-07-29: 60 mg via INTRAVENOUS

## 2021-07-29 MED ORDER — ONDANSETRON HCL 4 MG/2ML IJ SOLN
INTRAMUSCULAR | Status: DC | PRN
  Administered 2021-07-29: 4 mg via INTRAVENOUS

## 2021-07-29 MED ORDER — ROCURONIUM BROMIDE 10 MG/ML (PF) SYRINGE
PREFILLED_SYRINGE | INTRAVENOUS | Status: DC | PRN
  Administered 2021-07-29: 60 mg via INTRAVENOUS
  Administered 2021-07-29: 30 mg via INTRAVENOUS
  Administered 2021-07-29: 10 mg via INTRAVENOUS

## 2021-07-29 MED ORDER — HEPARIN SODIUM (PORCINE) 1000 UNIT/ML IJ SOLN
INTRAMUSCULAR | Status: AC
  Filled 2021-07-29: qty 10

## 2021-07-29 MED ORDER — ACETAMINOPHEN 325 MG PO TABS: 650.0000 mg | ORAL_TABLET | ORAL | Status: DC | PRN

## 2021-07-29 MED ORDER — HEPARIN 6000 UNIT IRRIGATION SOLUTION
Status: DC | PRN
  Administered 2021-07-29: 1

## 2021-07-29 MED ORDER — FENTANYL CITRATE (PF) 250 MCG/5ML IJ SOLN
INTRAMUSCULAR | Status: DC | PRN
  Administered 2021-07-29: 50 ug via INTRAVENOUS
  Administered 2021-07-29: 100 ug via INTRAVENOUS
  Administered 2021-07-29 (×2): 50 ug via INTRAVENOUS

## 2021-07-29 MED ORDER — DEXAMETHASONE SODIUM PHOSPHATE 10 MG/ML IJ SOLN
INTRAMUSCULAR | Status: AC
Start: 2021-07-29 — End: ?
  Filled 2021-07-29: qty 1

## 2021-07-29 MED ORDER — HEPARIN SODIUM (PORCINE) 1000 UNIT/ML IJ SOLN
INTRAMUSCULAR | Status: DC | PRN
  Administered 2021-07-29: 9000 [IU] via INTRAVENOUS
  Administered 2021-07-29: 3000 [IU] via INTRAVENOUS

## 2021-07-29 MED ORDER — SODIUM CHLORIDE (PF) 0.9 % IJ SOLN
INTRAVENOUS | Status: DC | PRN
  Administered 2021-07-29: 165 mL via INTRAMUSCULAR

## 2021-07-29 MED ORDER — LABETALOL HCL 5 MG/ML IV SOLN: 10.0000 mg | INTRAVENOUS | Status: DC | PRN

## 2021-07-29 MED ORDER — CEFAZOLIN SODIUM-DEXTROSE 2-4 GM/100ML-% IV SOLN
INTRAVENOUS | Status: AC
  Filled 2021-07-29: qty 100

## 2021-07-29 MED ORDER — PROPOFOL 10 MG/ML IV BOLUS
INTRAVENOUS | Status: DC | PRN
  Administered 2021-07-29: 140 mg via INTRAVENOUS

## 2021-07-29 MED ORDER — SODIUM CHLORIDE 0.9 % IV SOLN: 250.0000 mL | INTRAVENOUS | Status: DC | PRN

## 2021-07-29 MED ORDER — ACETAMINOPHEN 10 MG/ML IV SOLN: 1000.0000 mg | Freq: Once | INTRAVENOUS | Status: DC | PRN

## 2021-07-29 MED ORDER — SUGAMMADEX SODIUM 200 MG/2ML IV SOLN
INTRAVENOUS | Status: DC | PRN
  Administered 2021-07-29: 200 mg via INTRAVENOUS

## 2021-07-29 MED ORDER — SODIUM CHLORIDE 0.9 % IV SOLN: 1000.0000 mL | INTRAVENOUS | Status: DC

## 2021-07-29 MED ORDER — HEPARIN 6000 UNIT IRRIGATION SOLUTION
Status: AC
Start: 2021-07-29 — End: ?
  Filled 2021-07-29: qty 500

## 2021-07-29 MED ORDER — SODIUM CHLORIDE 0.9% FLUSH
3.0000 mL | Freq: Two times a day (BID) | INTRAVENOUS | Status: DC
  Administered 2021-07-30 – 2021-07-31 (×2): 3 mL via INTRAVENOUS

## 2021-07-29 MED ORDER — SODIUM CHLORIDE 0.9 % IV BOLUS (SEPSIS): 500.0000 mL | Freq: Once | INTRAVENOUS | Status: DC

## 2021-07-29 MED ORDER — SUCCINYLCHOLINE CHLORIDE 200 MG/10ML IV SOSY
PREFILLED_SYRINGE | INTRAVENOUS | Status: AC
  Filled 2021-07-29: qty 10

## 2021-07-29 MED ORDER — LIDOCAINE HCL (PF) 1 % IJ SOLN
INTRAMUSCULAR | Status: AC
  Filled 2021-07-29: qty 30

## 2021-07-29 MED ORDER — FENTANYL CITRATE (PF) 100 MCG/2ML IJ SOLN: 25.0000 ug | INTRAMUSCULAR | Status: DC | PRN

## 2021-07-29 MED ORDER — HYDRALAZINE HCL 20 MG/ML IJ SOLN: 5.0000 mg | INTRAMUSCULAR | Status: DC | PRN

## 2021-07-29 MED ORDER — HEPARIN (PORCINE) 25000 UT/250ML-% IV SOLN
1100.0000 [IU]/h | INTRAVENOUS | Status: DC
  Administered 2021-07-29: 1250 [IU]/h via INTRAVENOUS
  Filled 2021-07-29: qty 250

## 2021-07-29 MED ORDER — HEPARIN BOLUS VIA INFUSION
5500.0000 [IU] | Freq: Once | INTRAVENOUS | Status: DC
  Filled 2021-07-29: qty 5500

## 2021-07-29 MED ORDER — PHENYLEPHRINE HCL-NACL 20-0.9 MG/250ML-% IV SOLN
INTRAVENOUS | Status: DC | PRN
  Administered 2021-07-29: 25 ug/min via INTRAVENOUS

## 2021-07-29 MED ORDER — FENTANYL CITRATE (PF) 250 MCG/5ML IJ SOLN
INTRAMUSCULAR | Status: AC
  Filled 2021-07-29: qty 5

## 2021-07-29 MED ORDER — ONDANSETRON HCL 4 MG/2ML IJ SOLN
4.0000 mg | Freq: Four times a day (QID) | INTRAMUSCULAR | Status: DC | PRN
  Administered 2021-07-30: 4 mg via INTRAVENOUS
  Filled 2021-07-29: qty 2

## 2021-07-29 MED ORDER — DEXAMETHASONE SODIUM PHOSPHATE 10 MG/ML IJ SOLN
INTRAMUSCULAR | Status: DC | PRN
  Administered 2021-07-29: 5 mg via INTRAVENOUS

## 2021-07-29 MED ORDER — HEPARIN (PORCINE) 25000 UT/250ML-% IV SOLN
1250.0000 [IU]/h | INTRAVENOUS | Status: DC
  Filled 2021-07-29: qty 250

## 2021-07-29 MED ORDER — ESMOLOL HCL 100 MG/10ML IV SOLN
INTRAVENOUS | Status: DC | PRN
  Administered 2021-07-29: 40 mg via INTRAVENOUS
  Administered 2021-07-29: 20 mg via INTRAVENOUS
  Administered 2021-07-29: 40 mg via INTRAVENOUS

## 2021-07-29 MED ORDER — 0.9 % SODIUM CHLORIDE (POUR BTL) OPTIME
TOPICAL | Status: DC | PRN
  Administered 2021-07-29: 1000 mL

## 2021-07-29 MED ORDER — CEFAZOLIN SODIUM-DEXTROSE 2-3 GM-%(50ML) IV SOLR
INTRAVENOUS | Status: DC | PRN
  Administered 2021-07-29: 2 g via INTRAVENOUS

## 2021-07-29 MED ORDER — MIDAZOLAM HCL 2 MG/2ML IJ SOLN
INTRAMUSCULAR | Status: AC
  Filled 2021-07-29: qty 2

## 2021-07-29 SURGICAL SUPPLY — 60 items
BAG COUNTER SPONGE SURGICOUNT (BAG) IMPLANT
BUR JETSTREAM XC 2.1/3.0 (BURR) IMPLANT
BURR JETSTREAM XC 2.1/3.0 (BURR)
CANISTER SUCT 3000ML PPV (MISCELLANEOUS) IMPLANT
CATH ANGIO 5F BER 100CM (CATHETERS) ×2 IMPLANT
CATH BEACON 5 .035 65 KMP TIP (CATHETERS) ×1 IMPLANT
CATH DIAG EXPO 6F VENT PIG 145 (CATHETERS) ×1 IMPLANT
CATH OMNI FLUSH 5F 65CM (CATHETERS) ×2 IMPLANT
CATH VISIONS PV .035 IVUS (CATHETERS) ×1 IMPLANT
CATHETER TRIEVER16 ASPIRATION (MISCELLANEOUS) ×1 IMPLANT
CHLORAPREP W/TINT 26 (MISCELLANEOUS) ×2 IMPLANT
COVER DOME SNAP 22 D (MISCELLANEOUS) ×1 IMPLANT
DEVICE EMBOSHIELD NAV6 2.5-4.8 (FILTER) IMPLANT
DEVICE EMBOSHIELD NAV6 4.0-7.0 (FILTER) IMPLANT
DIAMONDBACK CLASSIC OAS 1.5MM (CATHETERS)
DIAMONDBACK CLASSIC OAS 2.0MM (CATHETERS)
DIAMONDBACK SOLID OAS 1.5MM (CATHETERS)
DIAMONDBACK SOLID OAS 2.0MM (CATHETERS)
DRAPE FEMORAL ANGIO 80X135IN (DRAPES) ×1 IMPLANT
DRSG PAD ABDOMINAL 8X10 ST (GAUZE/BANDAGES/DRESSINGS) ×1 IMPLANT
FEE PROC FLOWTRIEVER CATHS (MISCELLANEOUS) IMPLANT
FILTER FLOWSAVER DEVICE (MISCELLANEOUS) ×2 IMPLANT
GAUZE 4X4 16PLY ~~LOC~~+RFID DBL (SPONGE) ×1 IMPLANT
GAUZE SPONGE 4X4 12PLY STRL (GAUZE/BANDAGES/DRESSINGS) ×1 IMPLANT
GLIDEWIRE ADV .035X260CM (WIRE) ×1 IMPLANT
GLIDEWIRE ANGLED SS 035X260CM (WIRE) IMPLANT
GLOVE BIO SURGEON STRL SZ7.5 (GLOVE) ×2 IMPLANT
GLOVE INDICATOR 8.0 STRL GRN (GLOVE) ×2 IMPLANT
GOWN STRL REUS W/ TWL LRG LVL3 (GOWN DISPOSABLE) ×3 IMPLANT
GOWN STRL REUS W/ TWL XL LVL3 (GOWN DISPOSABLE) ×1 IMPLANT
GOWN STRL REUS W/TWL LRG LVL3 (GOWN DISPOSABLE) ×6
GOWN STRL REUS W/TWL XL LVL3 (GOWN DISPOSABLE) ×2
KIT BASIN OR (CUSTOM PROCEDURE TRAY) ×2 IMPLANT
KIT MICROPUNCTURE NIT STIFF (SHEATH) ×1 IMPLANT
KIT TURNOVER KIT B (KITS) ×2 IMPLANT
NS IRRIG 1000ML POUR BTL (IV SOLUTION) ×2 IMPLANT
PACK ENDO MINOR (CUSTOM PROCEDURE TRAY) ×2 IMPLANT
PAD ARMBOARD 7.5X6 YLW CONV (MISCELLANEOUS) ×4 IMPLANT
PAD NEG PRESSURE SENSATRAC (MISCELLANEOUS) ×1 IMPLANT
PROCEDURE FLOWTRIEVER (MISCELLANEOUS) ×2 IMPLANT
SHEATH CLOT RETRIEVER 16FR (SHEATH) ×1 IMPLANT
SHEATH PINNACLE 5F 10CM (SHEATH) ×3 IMPLANT
SHEATH PINNACLE 6F 10CM (SHEATH) IMPLANT
SHEATH PINNACLE 8F 10CM (SHEATH) ×1 IMPLANT
SLEEVE ISOL F/PACE RF HD COVER (MISCELLANEOUS) ×1 IMPLANT
SPONGE T-LAP 18X18 ~~LOC~~+RFID (SPONGE) ×1 IMPLANT
SUT ETHILON 3 0 PS 1 (SUTURE) ×1 IMPLANT
SYSTEM DIMNDBCK CLSC OAS 1.5MM (CATHETERS) IMPLANT
SYSTEM DIMNDBCK SLD OAS 1.5MM (CATHETERS) IMPLANT
SYSTEM DIMNDBCK SLD OAS 2.0MM (CATHETERS) IMPLANT
SYSTEM DIMODBCK CLSC OAS 2.0MM (CATHETERS) IMPLANT
TAPE PAPER 3X10 WHT MICROPORE (GAUZE/BANDAGES/DRESSINGS) ×1 IMPLANT
TOWEL GREEN STERILE (TOWEL DISPOSABLE) ×2 IMPLANT
TOWEL GREEN STERILE FF (TOWEL DISPOSABLE) IMPLANT
WATER STERILE IRR 1000ML POUR (IV SOLUTION) IMPLANT
WIRE BAREWIRE WORK .014X315CM (WIRE) IMPLANT
WIRE BENTSON .035X145CM (WIRE) ×3 IMPLANT
WIRE MICRO SET SILHO 5FR 7 (SHEATH) ×2 IMPLANT
WIRE SPARTACORE .014X300CM (WIRE) IMPLANT
WIRE VIPER ADVANCE .017X335CM (WIRE) IMPLANT

## 2021-07-29 NOTE — Progress Notes (Addendum)
Left LE venous duplex study completed. Notified Mickel Baas RN in ED of critical results. Please see CV Proc for preliminary results.  Anderson Malta  Durrel Mcnee BS, RVT 07/29/2021 12:25 PM

## 2021-07-29 NOTE — Anesthesia Postprocedure Evaluation (Signed)
Anesthesia Post Note  Patient: Ashley Price  Procedure(s) Performed: PERCUTANEOUS LEFT VENOUS THROMBECTOMY (Left: Leg Lower)     Patient location during evaluation: PACU Anesthesia Type: General Level of consciousness: awake and alert Pain management: pain level controlled Vital Signs Assessment: post-procedure vital signs reviewed and stable Respiratory status: spontaneous breathing, nonlabored ventilation, respiratory function stable and patient connected to nasal cannula oxygen Cardiovascular status: blood pressure returned to baseline and stable Postop Assessment: no apparent nausea or vomiting Anesthetic complications: no   No notable events documented.  Last Vitals:  Vitals:   07/29/21 2215 07/29/21 2227  BP:  (!) 151/95  Pulse: 73 80  Resp: 15   Temp:    SpO2: 100% 100%    Last Pain:  Vitals:   07/29/21 2227  TempSrc:   PainSc: 0-No pain                 Belenda Cruise P Xaine Sansom

## 2021-07-29 NOTE — ED Provider Triage Note (Signed)
Emergency Medicine Provider Triage Evaluation Note  Ashley Price , a 66 y.o. female  was evaluated in triage.  Pt complains of pt being more fatigued and "different" from her usual.   Fatigue, decreased appetitie 2/2 nausea.   Left leg is cold.   Seems her left leg was swollen for the past 2-3 days. Sensation is good.   Review of Systems  Positive: Leg pain, fatigue Negative: Fever   Physical Exam  BP 124/89 (BP Location: Left Arm)   Pulse 100   Temp 97.7 F (36.5 C) (Oral)   Resp 16   SpO2 100%  Gen:   Awake, no distress   Resp:  Normal effort  MSK:   Moves extremities without difficulty  Other:  Left leg w some swelling slightly cold compared to right. Faint but palpable left DP pulse  Medical Decision Making  Medically screening exam initiated at 10:17 AM.  Appropriate orders placed.  Ashley Price was informed that the remainder of the evaluation will be completed by another provider, this initial triage assessment does not replace that evaluation, and the importance of remaining in the ED until their evaluation is complete.  [employer] [place]   Ashley Price S, [place] 07/29/21 1023

## 2021-07-29 NOTE — Anesthesia Procedure Notes (Signed)
Procedure Name: Intubation Date/Time: 07/29/2021 7:24 PM  Performed by: Alain Marion, CRNAPre-anesthesia Checklist: Patient identified, Emergency Drugs available, Suction available and Patient being monitored Patient Re-evaluated:Patient Re-evaluated prior to induction Oxygen Delivery Method: Circle System Utilized Preoxygenation: Pre-oxygenation with 100% oxygen Induction Type: IV induction Ventilation: Mask ventilation without difficulty Laryngoscope Size: Miller and 2 Grade View: Grade I Tube type: Oral Tube size: 7.0 mm Number of attempts: 1 Airway Equipment and Method: Stylet and Oral airway Placement Confirmation: ETT inserted through vocal cords under direct vision, positive ETCO2 and breath sounds checked- equal and bilateral Secured at: 21 cm Tube secured with: Tape Dental Injury: Teeth and Oropharynx as per pre-operative assessment

## 2021-07-29 NOTE — Progress Notes (Addendum)
ANTICOAGULATION CONSULT NOTE - follow-up  Pharmacy Consult for Heparin Indication: DVT  No Known Allergies  Patient Measurements: Height: 5\' 3"  (160 cm) Weight: 79 kg (174 lb 2.6 oz) IBW/kg (Calculated) : 52.4 Heparin Dosing Weight: 69.9 kg  Vital Signs: Temp: 98.1 F (36.7 C) (06/30 1456) Temp Source: Oral (06/30 1007) BP: 158/90 (06/30 1800) Pulse Rate: 79 (06/30 1812)  Labs: Recent Labs    07/29/21 1035 07/29/21 1900  HGB 13.6  --   HCT 42.5  --   PLT 260  --   LABPROT  --  15.4*  INR  --  1.2  CREATININE 1.14*  --      Estimated Creatinine Clearance: 47.6 mL/min (A) (by C-G formula based on SCr of 1.14 mg/dL (H)).   Medical History: Past Medical History:  Diagnosis Date   Hypertension    Osteoarthritis    Stroke [facility])     Medications:  Medications Prior to Admission  Medication Sig Dispense Refill Last Dose   aspirin EC 81 MG EC tablet Take 1 tablet (81 mg total) by mouth daily. 30 tablet 0    aspirin EC 81 MG tablet 1 tablet      atorvastatin (LIPITOR) 40 MG tablet Take 1 tablet (40 mg total) by mouth daily at 6 PM. 30 tablet 0    atorvastatin (LIPITOR) 40 MG tablet 1 tablet at 6 pm      blood glucose meter kit and supplies KIT Dispense based on patient and insurance preference. Use up to four times daily as directed. (FOR ICD-9 250.00, 250.01). 1 each 0    ciclopirox (PENLAC) 8 % solution Apply topically at bedtime. Apply over nail and surrounding skin. Apply daily over previous coat. After seven (7) days, may remove with alcohol and continue cycle. 6.6 mL 0    diclofenac sodium (VOLTAREN) 1 % GEL Apply 2 g topically 4 (four) times daily. 1 Tube 0    lisinopril-hydrochlorothiazide (PRINZIDE,ZESTORETIC) 20-12.5 MG tablet Take 1 tablet by mouth daily.      metFORMIN (GLUCOPHAGE) 500 MG tablet Take 1 tablet (500 mg total) by mouth daily with breakfast. 30 tablet 0    metFORMIN (GLUCOPHAGE) 500 MG tablet 1 tablet with a meal      nabumetone (RELAFEN) 500 MG  tablet Take 500 mg by mouth 2 (two) times daily.       Scheduled:   [MAR Hold] heparin  5,500 Units Intravenous Once   Infusions:   [MAR Hold] sodium chloride     Followed by   [OZY Hold] sodium chloride     ceFAZolin     heparin     PRN:   Assessment: 26 yof with a history of hypertension osteoarthritis and stroke. Patient is presenting with weakness. Heparin per pharmacy consult placed for DVT.  Patient is not on anticoagulation prior to arrival.  Hgb 13.6; plt 260  Goal of Therapy:  Heparin level 0.3-0.7 units/ml Monitor platelets by anticoagulation protocol: Yes   Plan:  S/p surgical intervention. Dr. Mora Appl representative from [place] called and asked the heparin infusion start at 18 units/kg/hr (1250 units/hr) at 2115.   Start heparin infusion at 1250 units/hr Check heparin level in 6 hours and daily while on heparin Continue to monitor H&H and platelets  Szymon Foiles BS, PharmD, BCPS Clinical Pharmacist 07/29/2021 9:20 PM  Contact: [phone] after 3 PM  "Be curious, not judgmental..." -Jamal Maes

## 2021-07-29 NOTE — Anesthesia Preprocedure Evaluation (Addendum)
Anesthesia Evaluation  Patient identified by MRN, date of birth, ID band Patient awake    Reviewed: Allergy & Precautions, NPO status , Patient's Chart, lab work & pertinent test results  Airway Mallampati: II  TM Distance: >3 FB Neck ROM: Full    Dental  (+) Edentulous Upper, Edentulous Lower   Pulmonary neg pulmonary ROS, former smoker,    Pulmonary exam normal        Cardiovascular hypertension, Pt. on medications + Peripheral Vascular Disease   Rhythm:Regular Rate:Normal     Neuro/Psych CVA negative psych ROS   GI/Hepatic negative GI ROS, Neg liver ROS,   Endo/Other  diabetes, Type 2, Oral Hypoglycemic Agents  Renal/GU negative Renal ROS  negative genitourinary   Musculoskeletal  (+) Arthritis , Osteoarthritis,    Abdominal Normal abdominal exam  (+)   Peds  Hematology negative hematology ROS (+)   Anesthesia Other Findings   Reproductive/Obstetrics                            Anesthesia Physical Anesthesia Plan  ASA: 3  Anesthesia Plan: General   Post-op Pain Management:    Induction: Intravenous  PONV Risk Score and Plan: 3 and Ondansetron, Dexamethasone, Midazolam and Treatment may vary due to age or medical condition  Airway Management Planned: Mask and LMA  Additional Equipment: None  Intra-op Plan:   Post-operative Plan: Extubation in OR  Informed Consent: I have reviewed the patients History and Physical, chart, labs and discussed the procedure including the risks, benefits and alternatives for the proposed anesthesia with the patient or authorized representative who has indicated his/her understanding and acceptance.     Dental advisory given  Plan Discussed with: CRNA  Anesthesia Plan Comments: (Lab Results      Component                Value               Date                      WBC                      13.9 (H)            07/29/2021                HGB                       13.6                07/29/2021                HCT                      42.5                07/29/2021                MCV                      87.3                07/29/2021                PLT  260                 07/29/2021           Lab Results      Component                Value               Date                      NA                       134 (L)             07/29/2021                K                        3.8                 07/29/2021                CO2                      19 (L)              07/29/2021                GLUCOSE                  207 (H)             07/29/2021                BUN                      15                  07/29/2021                CREATININE               1.14 (H)            07/29/2021                CALCIUM                  9.6                 07/29/2021                GFRNONAA                 53 (L)              07/29/2021          )        Anesthesia Quick Evaluation

## 2021-07-29 NOTE — Progress Notes (Signed)
ANTICOAGULATION CONSULT NOTE - Initial Consult  Pharmacy Consult for Heparin Indication: DVT  No Known Allergies  Patient Measurements: Height: '5\' 3"'$  (160 cm) Weight: 79 kg (174 lb 2.6 oz) IBW/kg (Calculated) : 52.4 Heparin Dosing Weight: 69.9 kg  Vital Signs: Temp: 98.1 F (36.7 C) (06/30 1456) Temp Source: Oral (06/30 1007) BP: 147/94 (06/30 1730) Pulse Rate: 72 (06/30 1730)  Labs: Recent Labs    07/29/21 1035  HGB 13.6  HCT 42.5  PLT 260  CREATININE 1.14*    Estimated Creatinine Clearance: 47.6 mL/min (A) (by C-G formula based on SCr of 1.14 mg/dL (H)).   Medical History: Past Medical History:  Diagnosis Date   Hypertension    Osteoarthritis    Stroke ([place])     Medications:  (Not in a hospital admission)  Scheduled:   heparin  5,500 Units Intravenous Once   Infusions:   sodium chloride     Followed by   sodium chloride     heparin     PRN:   Assessment: 3 yof with a history of hypertension osteoarthritis and stroke. Patient is presenting with weakness. Heparin per pharmacy consult placed for DVT.  Patient is not on anticoagulation prior to arrival.  Hgb 13.6; plt 260  Goal of Therapy:  Heparin level 0.3-0.7 units/ml Monitor platelets by anticoagulation protocol: Yes   Plan:  Give 5500 units bolus x 1 Start heparin infusion at 1250 units/hr Check anti-Xa level in 8 hours and daily while on heparin Continue to monitor H&H and platelets  Lorelei Pont, PharmD, BCPS 07/29/2021 5:52 PM ED Clinical Pharmacist -  [phone]

## 2021-07-29 NOTE — Op Note (Signed)
DATE OF SERVICE: 07/29/2021  PATIENT:  Ashley Price  66 y.o. 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  POST-OPERATIVE DIAGNOSIS:  Same  PROCEDURE:   1) ultrasound guided access, left popliteal vein 2) intravascular ultrasound of inferior vena cava, left common iliac vein, left external iliac vein, left common femoral vein, left femoral vein 3) left lower extremity, central venogram, pulmonary angiogram (124m total contrast) 4) mechanical thrombectomy of inferior vena cava, left common iliac vein, left external iliac vein, left common femoral vein, left femoral vein  SURGEON:  Surgeon(s) and Role:    * HCherre Robins MD - Primary  ASSISTANT: none  ANESTHESIA:   general  EBL: 2019m BLOOD ADMINISTERED:none  DRAINS: none   LOCAL MEDICATIONS USED:  NONE  SPECIMEN:  none  COUNTS: confirmed correct.  TOURNIQUET:  none  PATIENT DISPOSITION:  PACU - hemodynamically stable.   Delay start of Pharmacological VTE agent (>24hrs) due to surgical blood loss or risk of bleeding: no  INDICATION FOR PROCEDURE: [name] a [age].o. female with extensive left lower extremity deep venous thrombosis extending from common iliac vein to tibial veins. This was causing her to feel systemically unwell with a cold, blue, mottled lower extremity. I was concerned she had early phlegmasia cerulea dolens. After careful discussion of risks, benefits, and alternatives the patient was offered venous thrombectomy. The patient understood and wished to proceed.  OPERATIVE FINDINGS: Good technical result from mechanical thrombectomy.  Large burden of thrombus cleared from iliac and femoral veins.  Flow channel demonstrated on completion angiogram and intravascular ultrasound.  Patient did have strange episodes of tachycardia to the 140s with stiff wire into the jugular vein.  Neither I nor the anesthesia team could explain this, and it seemed to resolve with  repositioning of the wire.  I performed a pulmonary angiogram and did not identify any large pulmonary emboli.  Both the main pulmonary arteries and distal vessels opacified well.  DESCRIPTION OF PROCEDURE: After identification of the patient in the pre-operative holding area, the patient was transferred to the operating room. The patient was positioned prone on the operating room table. Anesthesia was induced. The left popliteal space was prepped and draped in standard fashion. A surgical pause was performed confirming correct patient, procedure, and operative location.  Using ultrasound guidance, the left popliteal vein was accessed using micropuncture technique.  Our access was upsized to 5 [place] A Glidewire advantage was navigated into the right jugular vein.  Over the wire access was upsized to 8 [place] The patient was systemically heparinized.  Over the wire, intravascular ultrasound catheter was delivered.  A pullback intravascular ultrasound was performed from the inferior vena cava to the femoral vein on the left.  This identified acute thrombus, as predicted.  An Inari ClotTriever device was prepared and brought on the table.  A 13 Fr[place]annari sheath was introduced into the popliteal space.  Multiple passes of the thrombectomy device were performed.  Each returned large volume of acute thrombus.  After 5 passes, we noted the patient to become tachycardic.  I reinvestigated the wire position and found it to be in the right jugular vein.  I withdrew the wire past the heart into the inferior vena cava and the tachycardia seemed to resolve.  I elected to stop performing mechanical thrombectomy here.  I delivered a pigtail catheter into the right ventricle.  Pulmonary angiogram was performed.  I did not identify a  large pulmonary embolus, but imaging was limited given her tachycardia and lack of power injection.  I performed a completion angiogram in the left lower extremity.  This showed good  technical result until the very central common iliac vein where occlusive thrombus was noted.  We brought an Inari flow Treiber device onto the field to retrieve this small burden of clot without passing the wire past the heart again.  This was technically successful.  The patient developed more mild sinus tachycardia at this point.  Intravascular ultrasound was repeated and showed resolution of the thrombus burden in the left iliofemoral system.  I elected to stop here.  A figure-of-eight stitch was placed in the popliteal space with a nylon suture.  Hemostasis was achieved.  A bulky dressing was applied.  The appearance of the leg was greatly improved.  Upon completion of the case instrument and sharps counts were confirmed correct. The patient was transferred to the PACU in good condition. I was present for all portions of the procedure.  Ashley Aline. Stanford Breed, MD [facility] Phone Number: [phone] 07/29/2021 9:09 PM

## 2021-07-29 NOTE — ED Notes (Signed)
Consent obtained

## 2021-07-29 NOTE — Transfer of Care (Signed)
Immediate Anesthesia Transfer of Care Note  Patient: Ashley Price  Procedure(s) Performed: PERCUTANEOUS LEFT VENOUS THROMBECTOMY (Left: Leg Lower)  Patient Location: PACU  Anesthesia Type:General  Level of Consciousness: awake, alert  and oriented  Airway & Oxygen Therapy: Patient Spontanous Breathing and Patient connected to face mask oxygen  Post-op Assessment: Report given to RN and Post -op Vital signs reviewed and stable  Post vital signs: Reviewed and stable  Last Vitals:  Vitals Value Taken Time  BP 125/81 07/29/21 2136  Temp    Pulse 78 07/29/21 2142  Resp 16 07/29/21 2142  SpO2 100 % 07/29/21 2142  Vitals shown include unvalidated device data.  Last Pain:  Vitals:   07/29/21 1025  TempSrc:   PainSc: 0-No pain         Complications: No notable events documented.

## 2021-07-29 NOTE — H&P (Signed)
[facility] [place]  ASSESSMENT / PLAN: 66 y.o. female with extensive left lower extremity deep venous thrombosis concerning for early phlegmasia cerulea dolens. Plan endovascular venous thrombectomy tonight in OR. Anticoagulation with heparin.   CHIEF COMPLAINT: Malaise, left leg swelling  HISTORY OF PRESENT ILLNESS: Ashley Price is a 67 y.o. female who presents to the ER for evaluation of malaise and left leg swelling.  The patient is a little confused and not able to provide a clear history.  Her daughter is with her at bedside and is not sure the last time her left leg was normal, but they both think this is been going on for only a few weeks.  There is no personal or family history of clotting disorders.  The patient is up-to-date on her cancer screening.  Past Medical History:  Diagnosis Date   Hypertension    Osteoarthritis    Stroke [facility])     Past Surgical History:  Procedure Laterality Date   AMPUTATION Left 08/18/2018   Procedure: AMPUTATION LEFT 2ND TOE;  Surgeon: Tania Ade, MD;  Location: [facility];  Service: Orthopedics;  Laterality: Left;   TUBAL LIGATION Bilateral     Family History  Problem Relation Age of Onset   Breast cancer Mother    Kidney failure Brother     Social History   Socioeconomic History   Marital status: Single    Spouse name: Not on file   Number of children: Not on file   Years of education: Not on file   Highest education level: Not on file  Occupational History   Not on file  Tobacco Use   Smoking status: Former    Types: Cigarettes    Quit date: 12/30/2018    Years since quitting: 2.5   Smokeless tobacco: Never  Vaping Use   Vaping Use: Never used  Substance and Sexual Activity   Alcohol use: No   Drug use: No   Sexual activity: Not on file  Other Topics Concern   Not on file  Social History Narrative   Lives with her children   Social Determinants of Health   Financial Resource Strain: Not on  file  Food Insecurity: Not on file  Transportation Needs: Not on file  Physical Activity: Not on file  Stress: Not on file  Social Connections: Not on file  Intimate Partner Violence: Not on file    No Known Allergies  Current Facility-Administered Medications  Medication Dose Route Frequency Provider Last Rate Last Admin   sodium chloride 0.9 % bolus 500 mL  500 mL Intravenous Once Dorie Rank, MD       Followed by   0.9 %  sodium chloride infusion  1,000 mL Intravenous Continuous Dorie Rank, MD       heparin ADULT infusion 100 units/mL (25000 units/260mL)  1,250 Units/hr Intravenous Continuous Dorie Rank, MD       heparin bolus via infusion 5,500 Units  5,500 Units Intravenous Once Dorie Rank, MD       Current Outpatient Medications  Medication Sig Dispense Refill   aspirin EC 81 MG EC tablet Take 1 tablet (81 mg total) by mouth daily. 30 tablet 0   aspirin EC 81 MG tablet 1 tablet     atorvastatin (LIPITOR) 40 MG tablet Take 1 tablet (40 mg total) by mouth daily at 6 PM. 30 tablet 0   atorvastatin (LIPITOR) 40 MG tablet 1 tablet at 6 pm     blood glucose meter  kit and supplies KIT Dispense based on patient and insurance preference. Use up to four times daily as directed. (FOR ICD-9 250.00, 250.01). 1 each 0   ciclopirox (PENLAC) 8 % solution Apply topically at bedtime. Apply over nail and surrounding skin. Apply daily over previous coat. After seven (7) days, may remove with alcohol and continue cycle. 6.6 mL 0   diclofenac sodium (VOLTAREN) 1 % GEL Apply 2 g topically 4 (four) times daily. 1 Tube 0   lisinopril-hydrochlorothiazide (PRINZIDE,ZESTORETIC) 20-12.5 MG tablet Take 1 tablet by mouth daily.     metFORMIN (GLUCOPHAGE) 500 MG tablet Take 1 tablet (500 mg total) by mouth daily with breakfast. 30 tablet 0   metFORMIN (GLUCOPHAGE) 500 MG tablet 1 tablet with a meal     nabumetone (RELAFEN) 500 MG tablet Take 500 mg by mouth 2 (two) times daily.      PHYSICAL EXAM Vitals:    07/29/21 1730 07/29/21 1745 07/29/21 1800 07/29/21 1812  BP: (!) 147/94 (!) 176/95 (!) 158/90   Pulse: 72   79  Resp: $Remo'13 14 17 17  'JALtc$ Temp:      TempSrc:      SpO2: 98%   100%  Weight:      Height:        Constitutional: Lethargic.  Otherwise well-appearing in no acute distress. Cardiac: Regular rate and rhythm Respiratory: unlabored. Abdominal: soft, non-tender, non-distended.  Peripheral vascular: 2+ right dorsalis pedis pulse.  Difficult to palpate left dorsalis pedis pulse.  No functional Doppler in the emergency room.  Left leg swollen, cyanotic.  Normal sensory function in the left foot.  Difficult to move the left foot and ankle.      PERTINENT LABORATORY AND RADIOLOGIC DATA  Most recent CBC    Latest Ref Rng & Units 07/29/2021   10:35 AM 12/20/2018    1:11 PM 08/18/2018    4:15 AM  CBC  WBC 4.0 - 10.5 K/uL 13.9  16.7  12.6   Hemoglobin 12.0 - 15.0 g/dL 13.6  15.6  14.1   Hematocrit 36.0 - 46.0 % 42.5  47.2  41.8   Platelets 150 - 400 K/uL 260  303  292      Most recent CMP    Latest Ref Rng & Units 07/29/2021   10:35 AM 12/20/2018    2:11 PM 12/20/2018    1:11 PM  CMP  Glucose 70 - 99 mg/dL 207   145   BUN 8 - 23 mg/dL 15   8   Creatinine 0.44 - 1.00 mg/dL 1.14   0.97   Sodium 135 - 145 mmol/L 134   139   Potassium 3.5 - 5.1 mmol/L 3.8   3.7   Chloride 98 - 111 mmol/L 98   99   CO2 22 - 32 mmol/L 19   27   Calcium 8.9 - 10.3 mg/dL 9.6   9.8   Total Protein 6.5 - 8.1 g/dL 8.5  8.1    Total Bilirubin 0.3 - 1.2 mg/dL 0.7  0.6    Alkaline Phos 38 - 126 U/L 75  80    AST 15 - 41 U/L 20  15    ALT 0 - 44 U/L 15  10      Renal function Estimated Creatinine Clearance: 47.6 mL/min (A) (by C-G formula based on SCr of 1.14 mg/dL (H)).  Hgb A1c MFr Bld (%)  Date Value  02/11/2019 7.0 (H)    LDL Chol Calc (NIH)  Date Value Ref  Range Status  02/11/2019 29 0 - 99 mg/dL Final    Lower extremity venous duplex RIGHT:  - No evidence of common femoral vein  obstruction.     LEFT:  - Findings consistent with acute deep vein thrombosis involving the left  common femoral vein, SF junction, left femoral vein, left proximal  profunda vein, left popliteal vein, left posterior tibial veins, left  peroneal veins, left gastrocnemius veins,  and EIV and CIV.      [name]. Stanford Breed, MD [facility] Phone Number: [phone] 07/29/2021 6:18 PM  Total time spent on preparing this encounter including chart review, data review, collecting history, examining the patient, coordinating care for this new patient, 60 minutes.  Portions of this report may have been transcribed using voice recognition software.  Every effort has been made to ensure accuracy; however, inadvertent computerized transcription errors may still be present.

## 2021-07-29 NOTE — ED Provider Notes (Signed)
[facility] EMERGENCY DEPARTMENT Provider Note   CSN: [id number] Arrival date & time: 07/29/21  4888     History  Chief Complaint  Patient presents with   Weakness    Ashley Price is a 66 y.o. female.   Weakness   Patient has history of hypertension osteoarthritis and stroke.  She presents to the ED for evaluation of being more fatigued than usual.  She has felt lightheaded.  She has had decreased appetite and nausea.  Family states they also noted that her left leg looked rather swollen the last few days.  Patient also is complaining some pain with it.  Family states it is also cooler to the touch.  Patient denies any chest pain.  She is not having any shortness of breath.   Home Medications Prior to Admission medications   Medication Sig Start Date End Date Taking? Authorizing Provider  aspirin EC 81 MG EC tablet Take 1 tablet (81 mg total) by mouth daily. 12/24/18   Modena Jansky, MD  aspirin EC 81 MG tablet 1 tablet 12/23/18   [provider]  atorvastatin (LIPITOR) 40 MG tablet Take 1 tablet (40 mg total) by mouth daily at 6 PM. 12/23/18   Hongalgi, Lenis Dickinson, MD  atorvastatin (LIPITOR) 40 MG tablet 1 tablet at 6 pm    [provider]  blood glucose meter kit and supplies KIT Dispense based on patient and insurance preference. Use up to four times daily as directed. (FOR ICD-9 250.00, 250.01). 12/23/18   Hongalgi, Lenis Dickinson, MD  ciclopirox (PENLAC) 8 % solution Apply topically at bedtime. Apply over nail and surrounding skin. Apply daily over previous coat. After seven (7) days, may remove with alcohol and continue cycle. 07/15/20   Landis Martins, DPM  diclofenac sodium (VOLTAREN) 1 % GEL Apply 2 g topically 4 (four) times daily. 03/29/17   Ok Edwards, PA-C  lisinopril-hydrochlorothiazide (PRINZIDE,ZESTORETIC) 20-12.5 MG tablet Take 1 tablet by mouth daily.    [provider]  metFORMIN (GLUCOPHAGE) 500 MG tablet Take 1 tablet (500  mg total) by mouth daily with breakfast. 12/23/18 02/11/19  Hongalgi, Lenis Dickinson, MD  metFORMIN (GLUCOPHAGE) 500 MG tablet 1 tablet with a meal    [provider]  nabumetone (RELAFEN) 500 MG tablet Take 500 mg by mouth 2 (two) times daily.    [provider]      Allergies    Patient has no known allergies.    Review of Systems   Review of Systems  Neurological:  Positive for weakness.    Physical Exam Updated Vital Signs BP (!) 158/90   Pulse 79   Temp 98.1 F (36.7 C)   Resp 17   Ht 1.6 m (_0 )   Wt 79 kg   SpO2 100%   BMI 30.85 kg/m  Physical Exam Vitals and nursing note reviewed.  Constitutional:      Appearance: She is well-developed. She is not diaphoretic.  HENT:     Head: Normocephalic and atraumatic.     Right Ear: External ear normal.     Left Ear: External ear normal.  Eyes:     General: No scleral icterus.       Right eye: No discharge.        Left eye: No discharge.     Conjunctiva/sclera: Conjunctivae normal.  Neck:     Trachea: No tracheal deviation.  Cardiovascular:     Rate and Rhythm: Normal rate and regular rhythm.  Pulmonary:     Effort: Pulmonary effort is normal. No respiratory distress.     Breath sounds: Normal breath sounds. No stridor. No wheezing or rales.  Abdominal:     General: Bowel sounds are normal. There is no distension.     Palpations: Abdomen is soft.     Tenderness: There is no abdominal tenderness. There is no guarding or rebound.  Musculoskeletal:        General: No tenderness or deformity.     Cervical back: Neck supple.     Left lower leg: Edema present.     Comments: Left leg does appear cyanotic and is cool to the touch, unable to palpate a dorsalis pedal pulse  Skin:    General: Skin is warm and dry.     Findings: No rash.  Neurological:     General: No focal deficit present.     Mental Status: She is alert.     Cranial Nerves: No cranial nerve deficit (no facial droop, extraocular movements  intact, no slurred speech).     Sensory: No sensory deficit.     Motor: No abnormal muscle tone or seizure activity.     Coordination: Coordination normal.  Psychiatric:        Mood and Affect: Mood normal.     ED Results / Procedures / Treatments   Labs (all labs ordered are listed, but only abnormal results are displayed) Labs Reviewed  COMPREHENSIVE METABOLIC PANEL - Abnormal; Notable for the following components:      Result Value   Sodium 134 (*)    CO2 19 (*)    Glucose, Bld 207 (*)    Creatinine, Ser 1.14 (*)    Total Protein 8.5 (*)    Albumin 3.2 (*)    GFR, Estimated 53 (*)    Anion gap 17 (*)    All other components within normal limits  CBC - Abnormal; Notable for the following components:   WBC 13.9 (*)    All other components within normal limits  LACTIC ACID, PLASMA - Abnormal; Notable for the following components:   Lactic Acid, Venous 3.6 (*)    All other components within normal limits  TSH  MAGNESIUM  URINALYSIS, ROUTINE W REFLEX MICROSCOPIC  LACTIC ACID, PLASMA  PROTIME-INR  HEPARIN LEVEL (UNFRACTIONATED)  HEPARIN LEVEL (UNFRACTIONATED)    EKG None  Radiology HYBRID OR IMAGING ([facility] ONLY)  Result Date: 07/29/2021 There is no interpretation for this exam.  This order is for images obtained during a surgical procedure.  Please See "Surgeries" Tab for more information regarding the procedure.   VAS [place] LOWER EXTREMITY VENOUS (DVT) (7a-7p)  Result Date: 07/29/2021  Lower Venous DVT Study Patient Name:  Ashley Price  Date of Exam:   07/29/2021 Medical Rec #: [id number]       Accession #:    [id number] Date of Birth: [birth year]        Patient Gender: F Patient Age:   66 years Exam Location:  [facility] Procedure:      VAS [place] LOWER EXTREMITY VENOUS (DVT) Referring Phys: Ova Freshwater FONDAW --------------------------------------------------------------------------------  Indications: Pain, Swelling, and Erythema.  Performing Technologist: Bobetta Lime BS,  RVT  Examination Guidelines: A complete evaluation includes B-mode imaging, spectral Doppler, color Doppler, and power Doppler as needed of all accessible portions of each vessel. Bilateral testing is considered an integral part of a complete examination. Limited examinations for reoccurring indications may be performed as noted. The reflux portion of the exam  is performed with the patient in reverse Trendelenburg.  +-----+---------------+---------+-----------+----------+--------------+ RIGHTCompressibilityPhasicitySpontaneityPropertiesThrombus Aging +-----+---------------+---------+-----------+----------+--------------+ CFV  Full           Yes      Yes                                 +-----+---------------+---------+-----------+----------+--------------+   +---------+---------------+---------+-----------+----------+--------------+ LEFT     CompressibilityPhasicitySpontaneityPropertiesThrombus Aging +---------+---------------+---------+-----------+----------+--------------+ CFV      None           No       No                   Acute          +---------+---------------+---------+-----------+----------+--------------+ SFJ      None                                         Acute          +---------+---------------+---------+-----------+----------+--------------+ FV Prox  None           No       No                   Acute          +---------+---------------+---------+-----------+----------+--------------+ FV Mid   None           No       No                   Acute          +---------+---------------+---------+-----------+----------+--------------+ FV DistalNone           No       No                   Acute          +---------+---------------+---------+-----------+----------+--------------+ PFV      None           Yes      Yes                  Acute          +---------+---------------+---------+-----------+----------+--------------+ POP      None            No       No                   Acute          +---------+---------------+---------+-----------+----------+--------------+ PTV      None                                         Acute          +---------+---------------+---------+-----------+----------+--------------+ PERO     None                                         Acute          +---------+---------------+---------+-----------+----------+--------------+ Gastroc  Full  Acute          +---------+---------------+---------+-----------+----------+--------------+ EIV                     No       No                   Acute          +---------+---------------+---------+-----------+----------+--------------+ CIV                     No       No                   Acute          +---------+---------------+---------+-----------+----------+--------------+ The left common iliac and external iliac revealed no flow. Flow was noted in the IVC.   Summary: RIGHT: - No evidence of common femoral vein obstruction.  LEFT: - Findings consistent with acute deep vein thrombosis involving the left common femoral vein, SF junction, left femoral vein, left proximal profunda vein, left popliteal vein, left posterior tibial veins, left peroneal veins, left gastrocnemius veins, and EIV and CIV.  *See table(s) above for measurements and observations.    Preliminary    CT HEAD WO CONTRAST (5MM)  Result Date: 07/29/2021 CLINICAL DATA:  Provided history: Delirium, fatigue. EXAM: CT HEAD WITHOUT CONTRAST TECHNIQUE: Contiguous axial images were obtained from the base of the skull through the vertex without intravenous contrast. RADIATION DOSE REDUCTION: This exam was performed according to the departmental dose-optimization program which includes automated exposure control, adjustment of the mA and/or kV according to patient size and/or use of iterative reconstruction technique. COMPARISON:  CT angiogram head  02/27/2019.  Brain MRI 12/20/2018. FINDINGS: Brain: Mild-to-moderate cerebral atrophy. Comparatively mild cerebellar atrophy. Chronic small-vessel infarcts within the bilateral corona radiata, bilateral deep gray nuclei and within the brainstem. Small chronic infarcts within the bilateral cerebellar hemispheres. Some of these infarcts were better appreciated on the prior brain MRI of 12/20/2018. Background moderate patchy and ill-defined hypoattenuation within the cerebral white matter, nonspecific but compatible chronic small vessel image disease. There is no acute intracranial hemorrhage. No demarcated cortical infarct. No extra-axial fluid collection. No evidence of an intracranial mass. No midline shift. Vascular: No hyperdense vessel. Atherosclerotic calcifications. Skull: No fracture or aggressive osseous lesion. Sinuses/Orbits: No mass or acute finding within the imaged orbits. Small mucous retention cyst versus mucosal thickening within the left sphenoid sinus. Mucosal thickening within a posterior right ethmoid air cell. IMPRESSION: No evidence of acute intracranial abnormality. Parenchymal atrophy, chronic small vessel ischemic disease and multiple chronic infarcts as described. Mild paranasal sinus disease. Electronically Signed   By: Kellie Simmering D.O.   On: 07/29/2021 11:24   DG Chest 2 View  Result Date: 07/29/2021 CLINICAL DATA:  Fatigue. EXAM: CHEST - 2 VIEW COMPARISON:  None Available. FINDINGS: The heart size and mediastinal contours are within normal limits. Both lungs are clear. The visualized skeletal structures are unremarkable. IMPRESSION: No active cardiopulmonary disease. Electronically Signed   By: Keane Police D.O.   On: 07/29/2021 10:48    Procedures .Critical Care  Performed by: Dorie Rank, MD Authorized by: Dorie Rank, MD   Critical care provider statement:    Critical care time (minutes):  30   Critical care was time spent personally by me on the following activities:   Development of treatment plan with patient or surrogate, discussions with consultants, evaluation of patient's response to treatment, examination of patient, ordering and  review of laboratory studies, ordering and review of radiographic studies, ordering and performing treatments and interventions, pulse oximetry, re-evaluation of patient's condition and review of old charts     Medications Ordered in ED Medications  sodium chloride 0.9 % bolus 500 mL ( Intravenous MAR Hold 07/29/21 1836)    Followed by  0.9 %  sodium chloride infusion ( Intravenous MAR Hold 07/29/21 1836)  heparin ADULT infusion 100 units/mL (25000 units/216m) (has no administration in time range)  heparin bolus via infusion 5,500 Units ( Intravenous MAR Hold 07/29/21 1836)    ED Course/ Medical Decision Making/ A&P Clinical Course as of 07/29/21 1840  Fri Jul 29, 2021  1721 VAS [place] EXTREMITY VENOUS (DVT) (7a-7p) Doppler study shows large left-sided acute DVT [JK]  1721 CT HEAD WO CONTRAST (5MM) Head CTParts noted without acute findings, chronic and [JK]  1721 DG Chest 2 View Chest x-ray images and radiology report reviewed no acute finding [JK]  1722 Comprehensive metabolic panel(!) Bicarb slightly decreased, glucose does increase, sodium decreased [JK]  1722 Lactic acid, plasma(!!) Lactic acid level elevated [JK]  1722 CBC(!) White blood cell count elevated [JK]  1751 Case discussed with Dr HStanford Breed  He will come evaluate the patient.  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 Radiology: independent interpretation performed. Decision-making details documented in ED Course. Discussion of management or test interpretation with external provider(s): Case discussed with Dr. HLuan Pulling  He will evaluate the patient in the ED for possible acute vascular intervention  Risk Prescription drug management. Decision regarding hospitalization. Emergency major surgery.   Patient presented to ED with complaints of weakness leg swelling.  On exam patient noted to have significant swelling of her entire left leg.  She does have evidence of decreased perfusion as well and her leg is cold to the touch compared to the right.  Patient's Doppler study shows a large DVT that is involving her common and external iliac veins.  CT angio was ordered at triage however patient is not having any chest pain or shortness of breath.  Certainly is at risk for PE.  That study still pending.  Patient has been started on IV heparin.  I have consulted with vascular surgery Dr. HStanford Breed  He will be evaluating the patient in the ED.  With her large DVT with concerns for vascular compromise patient be admitted to the hospital further treatment.  Elevated lactic acid level noted but I am concerned more with regards to limb ischemia rather than acute infection.       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

## 2021-07-29 NOTE — ED Notes (Signed)
Attempted IV start x 2 unsuccessfully. Short stay is aware

## 2021-07-29 NOTE — ED Notes (Signed)
Per Tomi Bamberger MD, draw lactic after fluid bolus

## 2021-07-29 NOTE — ED Triage Notes (Signed)
Pt arrives POV for eval of generalized malaise and weakness and Just feeling "different from her norm". Daughter reports some recent changes in her BP meds, including coming off her HCTZ. Endorses some unilateral L sided leg swelling which is cool to the touch in triage. +DP pulse to L foot, faint. Sensation intact

## 2021-07-29 NOTE — ED Notes (Signed)
All belongings removed and given to pt's daughter

## 2021-07-30 ENCOUNTER — Inpatient Hospital Stay (HOSPITAL_COMMUNITY): Payer: Medicare Other

## 2021-07-30 DIAGNOSIS — I361 Nonrheumatic tricuspid (valve) insufficiency: Secondary | ICD-10-CM | POA: Diagnosis not present

## 2021-07-30 DIAGNOSIS — I10 Essential (primary) hypertension: Secondary | ICD-10-CM

## 2021-07-30 LAB — LIPID PANEL
Cholesterol: 73 mg/dL (ref 0–200)
HDL: 33 mg/dL — ABNORMAL LOW (ref >40)
LDL Cholesterol: 32 mg/dL (ref 0–99)
Total CHOL/HDL Ratio: 2.2 RATIO
Triglycerides: 40 mg/dL (ref <150)
VLDL: 8 mg/dL (ref 0–40)

## 2021-07-30 LAB — ECHOCARDIOGRAM COMPLETE
Area-P 1/2: 4.49 cm2
Height: 63 in
S' Lateral: 2.6 cm
Weight: 2786.61 oz

## 2021-07-30 LAB — LACTIC ACID, PLASMA: Lactic Acid, Venous: 2 mmol/L (ref 0.5–1.9)

## 2021-07-30 LAB — COMPREHENSIVE METABOLIC PANEL
ALT: 13 U/L (ref 0–44)
AST: 17 U/L (ref 15–41)
Albumin: 2.2 g/dL — ABNORMAL LOW (ref 3.5–5.0)
Alkaline Phosphatase: 50 U/L (ref 38–126)
Anion gap: 9 (ref 5–15)
BUN: 12 mg/dL (ref 8–23)
CO2: 21 mmol/L — ABNORMAL LOW (ref 22–32)
Calcium: 8.4 mg/dL — ABNORMAL LOW (ref 8.9–10.3)
Chloride: 106 mmol/L (ref 98–111)
Creatinine, Ser: 0.94 mg/dL (ref 0.44–1.00)
GFR, Estimated: >60 mL/min (ref >60)
Glucose, Bld: 136 mg/dL — ABNORMAL HIGH (ref 70–99)
Potassium: 3.5 mmol/L (ref 3.5–5.1)
Sodium: 136 mmol/L (ref 135–145)
Total Bilirubin: 0.3 mg/dL (ref 0.3–1.2)
Total Protein: 5.7 g/dL — ABNORMAL LOW (ref 6.5–8.1)

## 2021-07-30 LAB — HEPARIN LEVEL (UNFRACTIONATED): Heparin Unfractionated: >1.1 IU/mL — ABNORMAL HIGH (ref 0.30–0.70)

## 2021-07-30 MED ORDER — RIVAROXABAN 15 MG PO TABS
15.0000 mg | ORAL_TABLET | Freq: Two times a day (BID) | ORAL | Status: DC
  Administered 2021-07-30 – 2021-07-31 (×3): 15 mg via ORAL
  Filled 2021-07-30 (×3): qty 1

## 2021-07-30 MED ORDER — RIVAROXABAN 20 MG PO TABS: 20.0000 mg | ORAL_TABLET | Freq: Every day | ORAL | Status: DC

## 2021-07-30 NOTE — Plan of Care (Signed)
  Problem: Education: Goal: Knowledge of General Education information will improve Description: Including pain rating scale, medication(s)/side effects and non-pharmacologic comfort measures Outcome: Progressing   Problem: Health Behavior/Discharge Planning: Goal: Ability to manage health-related needs will improve Outcome: Progressing   Problem: Clinical Measurements: Goal: Will remain free from infection Outcome: Progressing   Problem: Activity: Goal: Risk for activity intolerance will decrease Outcome: Progressing   Problem: Nutrition: Goal: Adequate nutrition will be maintained Outcome: Progressing   Problem: Pain Managment: Goal: General experience of comfort will improve Outcome: Progressing   Problem: Skin Integrity: Goal: Risk for impaired skin integrity will decrease Outcome: Progressing

## 2021-07-30 NOTE — Discharge Instructions (Addendum)
Discharge Instructions for DVT (Deep Vein Thrombosis):  Call 911 or visit the nearest emergency room if you experience any of the following:  Sudden chest tightness or pain. Sharp pain when taking a deep breath. Cough with bloody mucus. Sudden shortness of breath or fast breathing A fast heart rate. Cold skin, clammy skin, or sweating. New swelling in the arms or legs.  Continue to take your anticoagulation medication daily as prescribed Elevate your affected extremity daily above the level of the heart for 20-30 minutes daily Wear your thigh high compression stocking daily You will have follow up with your Vascular provider in 1 month with an ultrasound   Information on my medicine - XARELTO (rivaroxaban)  This medication education was reviewed with me or my healthcare representative as part of my discharge preparation.  The pharmacist that spoke with me during my hospital stay was:  Einar Grad, [facility]  WHY WAS [place]? Xarelto was prescribed to treat blood clots that may have been found in the veins of your legs (deep vein thrombosis) or in your lungs (pulmonary embolism) and to reduce the risk of them occurring again.  What do you need to know about Xarelto? The starting dose is one 15 mg tablet taken TWICE daily with food for the FIRST 21 DAYS then on (enter date)  07/21/21  the dose is changed to one 20 mg tablet taken ONCE A DAY with your evening meal.  DO NOT stop taking Xarelto without talking to the health care provider who prescribed the medication.  Refill your prescription for 20 mg tablets before you run out.  After discharge, you should have regular check-up appointments with your healthcare provider that is prescribing your Xarelto.  In the future your dose may need to be changed if your kidney function changes by a significant amount.  What do you do if you miss a dose? If you are taking Xarelto TWICE DAILY and you miss a dose, take it as  soon as you remember. You may take two 15 mg tablets (total 30 mg) at the same time then resume your regularly scheduled 15 mg twice daily the next day.  If you are taking Xarelto ONCE DAILY and you miss a dose, take it as soon as you remember on the same day then continue your regularly scheduled once daily regimen the next day. Do not take two doses of Xarelto at the same time.   Important Safety Information Xarelto is a blood thinner medicine that can cause bleeding. You should call your healthcare provider right away if you experience any of the following: Bleeding from an injury or your nose that does not stop. Unusual colored urine (red or dark brown) or unusual colored stools (red or black). Unusual bruising for unknown reasons. A serious fall or if you hit your head (even if there is no bleeding).  Some medicines may interact with Xarelto and might increase your risk of bleeding while on Xarelto. To help avoid this, consult your healthcare provider or pharmacist prior to using any new prescription or non-prescription medications, including herbals, vitamins, non-steroidal anti-inflammatory drugs (NSAIDs) and supplements.  This website has more information on Xarelto: [email or web address]

## 2021-07-30 NOTE — Progress Notes (Signed)
  Echocardiogram 2D Echocardiogram has been performed.  Ashley Price 07/30/2021, 1:58 PM

## 2021-07-30 NOTE — Evaluation (Signed)
Occupational Therapy Evaluation Patient Details Name: Ashley Price MRN: [id number] DOB: [birth year] Today's Date: 07/30/2021   History of Present Illness 66 y.o. female who presents to the ER for evaluation of malaise and left leg swelling.  S/p mechanical thrombectomy of inferior vena cava, left common iliac vein, left external iliac vein, left common femoral vein, left femoral vein 6/30.  PMH includes: HTN, OA and CVA.   Clinical Impression   Patient admitted for the diagnosis and subsequent procedure above.  PTA she typically uses a SPC for mobility, continues to drive, and is able to complete ADL and iADL without much assist.  Daughters' live with her, and are able to provide assist as needed.  No further needs in the acute setting for OT, and no post acute OT is anticipated.  Encouraged mobility with staff.        Recommendations for follow up therapy are one component of a multi-disciplinary discharge planning process, led by the attending physician.  Recommendations may be updated based on patient status, additional functional criteria and insurance authorization.   Follow Up Recommendations  No OT follow up    Assistance Recommended at Discharge PRN  Patient can return home with the following Assist for transportation    Functional Status Assessment  Patient has had a recent decline in their functional status and demonstrates the ability to make significant improvements in function in a reasonable and predictable amount of time.  Equipment Recommendations  None recommended by OT    Recommendations for Other Services       Precautions / Restrictions Precautions Precautions: Fall Restrictions Weight Bearing Restrictions: No      Mobility Bed Mobility Overal bed mobility: Modified Independent                  Transfers Overall transfer level: Needs assistance Equipment used: Rolling walker (2 wheels) Transfers: Sit to/from Stand, Bed to chair/wheelchair/BSC Sit  to Stand: Supervision           General transfer comment: VC's not to pull from RW      Balance Overall balance assessment: Mild deficits observed, not formally tested                                         ADL either performed or assessed with clinical judgement   ADL Overall ADL's : At baseline                                       General ADL Comments: patient with generalized supervision only due to lines.     Vision Patient Visual Report: No change from baseline       Perception Perception Perception: Within Functional Limits   Praxis Praxis Praxis: Intact    Pertinent Vitals/Pain Pain Assessment Pain Assessment: No/denies pain     Hand Dominance Right   Extremity/Trunk Assessment Upper Extremity Assessment Upper Extremity Assessment: Overall WFL for tasks assessed   Lower Extremity Assessment Lower Extremity Assessment: Defer to PT evaluation   Cervical / Trunk Assessment Cervical / Trunk Assessment: Normal   Communication Communication Communication: No difficulties   Cognition Arousal/Alertness: Awake/alert Behavior During Therapy: WFL for tasks assessed/performed Overall Cognitive Status: Within Functional Limits for tasks assessed  General Comments   VSS on RA               Home Living Family/patient expects to be discharged to:: Private residence Living Arrangements: Children Available Help at Discharge: Family;Available 24 hours/day Type of Home: House Home Access: Level entry     Home Layout: Two level;1/2 bath on main level;Bed/bath upstairs;Other (Comment);Able to live on main level with bedroom/bathroom (Shower is upstairs.) Alternate Level Stairs-Number of Steps: full flight   Bathroom Shower/Tub: Tub/shower unit;Walk-in shower   Bathroom Toilet: Standard Bathroom Accessibility: Yes How Accessible: Accessible via walker Home  Equipment: Rollator (4 wheels);Cane - single point   Additional Comments: Uses the SPC mainly      Prior Functioning/Environment Prior Level of Function : Independent/Modified Independent;Driving             Mobility Comments: Uses SPC majority of the time.  RW use depends on chronic B knee arthritic pain. ADLs Comments: Ind with ADL, and participates with iADL.  Continues to drive.        OT Problem List: Pain      OT Treatment/Interventions:      OT Goals(Current goals can be found in the care plan section) Acute Rehab OT Goals Patient Stated Goal: Hoping to return home tomorrow with daughter's OT Goal Formulation: With patient Time For Goal Achievement: 08/01/21 Potential to Achieve Goals: Good  OT Frequency:      Co-evaluation              AM-PAC OT "6 Clicks" Daily Activity     Outcome Measure Help from another person eating meals?: None   Help from another person toileting, which includes using toliet, bedpan, or urinal?: A Little Help from another person bathing (including washing, rinsing, drying)?: A Little Help from another person to put on and taking off regular upper body clothing?: None Help from another person to put on and taking off regular lower body clothing?: A Little 6 Click Score: 17   End of Session Equipment Utilized During Treatment: Rolling walker (2 wheels) Nurse Communication: Mobility status  Activity Tolerance: Patient tolerated treatment well Patient left: in chair;with call bell/phone within reach;with family/visitor present  OT Visit Diagnosis: Pain Pain - Right/Left: Left Pain - part of body: Leg                Time: 5631-4970 OT Time Calculation (min): 19 min Charges:  OT General Charges $OT Visit: 1 Visit OT Evaluation $OT Eval Moderate Complexity: 1 Mod  07/30/2021  RP, OTR/L  Acute Rehabilitation Services  Office:  [phone]   Metta Clines 07/30/2021, 10:35 AM

## 2021-07-30 NOTE — Progress Notes (Signed)
ANTICOAGULATION CONSULT NOTE  Pharmacy Consult for Heparin Indication: DVT Brief A/P: Heparin level supratherapeutic Decrease Heparin rate  No Known Allergies  Patient Measurements: Height: '5\' 3"'$  (160 cm) Weight: 79 kg (174 lb 2.6 oz) IBW/kg (Calculated) : 52.4 Heparin Dosing Weight: 69.9 kg  Vital Signs: Temp: 97.3 F (36.3 C) (07/01 0400) Temp Source: Oral (07/01 0400) BP: 123/73 (07/01 0400) Pulse Rate: 80 (07/01 0400)  Labs: Recent Labs    07/29/21 1035 07/29/21 1900 07/29/21 2157 07/30/21 0549  HGB 13.6  --  11.4*  --   HCT 42.5  --  32.2*  --   PLT 260  --  229  --   LABPROT  --  15.4*  --   --   INR  --  1.2  --   --   HEPARINUNFRC  --   --   --  >1.10*  CREATININE 1.14*  --   --   --      Estimated Creatinine Clearance: 47.6 mL/min (A) (by C-G formula based on SCr of 1.14 mg/dL (H)).   Assessment: 66 y.o. female admitted with LLE DVT s/p thrombectomy for heparin.  Received heparin 12,000 units IV in OR yesterday evening.  Goal of Therapy:  Heparin level 0.3-0.7 units/ml Monitor platelets by anticoagulation protocol: Yes   Plan:  Decrease Heparin 1100 units/hr Check heparin level in 8 hours.  Phillis Knack, PharmD, BCPS

## 2021-07-30 NOTE — Progress Notes (Signed)
[facility] [place] PROGRESS NOTE  ASSESSMENT / PLAN: Ashley Price is a 66 y.o. 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  She is doing very well today. Awaiting serum chemistries. Good urine output overnight. Remove foley. Stop IV fluids. Transition heparin to Xarelto. PT/OT. Anticipate discharge in next 24 hours.   SUBJECTIVE: Much improved. Leg feels better. Patient feeling better this AM. Reviewed operative conduct.   OBJECTIVE: BP 115/61 (BP Location: Right Arm)   Pulse 75   Temp 97.9 F (36.6 C) (Oral)   Resp 19   Ht '5\' 3"'$  (1.6 m)   Wt 79 kg   SpO2 100%   BMI 30.85 kg/m   Intake/Output Summary (Last 24 hours) at 07/30/2021 0840 Last data filed at 07/30/2021 0717 Gross per 24 hour  Intake 2050 ml  Output 1300 ml  Net 750 ml    No acute distress Clean bandage Foley in place Left leg skin appearance and swelling much improved Left leg warm and well perfused     Latest Ref Rng & Units 07/29/2021    9:57 PM 07/29/2021   10:35 AM 12/20/2018    1:11 PM  CBC  WBC 4.0 - 10.5 K/uL 19.0  13.9  16.7   Hemoglobin 12.0 - 15.0 g/dL 11.4  13.6  15.6   Hematocrit 36.0 - 46.0 % 32.2  42.5  47.2   Platelets 150 - 400 K/uL 229  260  303         Latest Ref Rng & Units 07/29/2021   10:35 AM 12/20/2018    2:11 PM 12/20/2018    1:11 PM  CMP  Glucose 70 - 99 mg/dL 207   145   BUN 8 - 23 mg/dL 15   8   Creatinine 0.44 - 1.00 mg/dL 1.14   0.97   Sodium 135 - 145 mmol/L 134   139   Potassium 3.5 - 5.1 mmol/L 3.8   3.7   Chloride 98 - 111 mmol/L 98   99   CO2 22 - 32 mmol/L 19   27   Calcium 8.9 - 10.3 mg/dL 9.6   9.8   Total Protein 6.5 - 8.1 g/dL 8.5  8.1    Total Bilirubin 0.3 - 1.2 mg/dL 0.7  0.6    Alkaline Phos 38 - 126 U/L 75  80    AST 15 - 41 U/L 20  15    ALT 0 - 44 U/L 15  10      Estimated Creatinine Clearance: 47.6 mL/min (A) (by C-G formula based on SCr of 1.14 mg/dL (H)).  Ashley Price. Ashley Breed, MD [facility] Phone Number: [phone] 07/30/2021 8:40 AM

## 2021-07-30 NOTE — Evaluation (Signed)
Physical Therapy Evaluation Patient Details Name: Ashley Price MRN: [id number] DOB: 29-Apr-1955 Today's Date: 07/30/2021  History of Present Illness  66 y.o. female who presents to the ER for evaluation of malaise and left leg swelling.  S/p mechanical thrombectomy of inferior vena cava, left common iliac vein, left external iliac vein, left common femoral vein, left femoral vein 6/30.  PMH includes: HTN, OA and CVA.  Clinical Impression  Pt doing well with mobility. Needs some help with stairs but at home only needs to go up/down stairs for shower. Family available to assist. Pt ready for dc from PT standpoint.        Recommendations for follow up therapy are one component of a multi-disciplinary discharge planning process, led by the attending physician.  Recommendations may be updated based on patient status, additional functional criteria and insurance authorization.  Follow Up Recommendations No PT follow up      Assistance Recommended at Discharge PRN  Patient can return home with the following  Help with stairs or ramp for entrance    Equipment Recommendations None recommended by PT  Recommendations for Other Services       Functional Status Assessment Patient has not had a recent decline in their functional status     Precautions / Restrictions Precautions Precautions: Fall Restrictions Weight Bearing Restrictions: No      Mobility  Bed Mobility Overal bed mobility: Modified Independent                  Transfers Overall transfer level: Needs assistance Equipment used: Rollator (4 wheels) Transfers: Sit to/from Stand Sit to Stand: Supervision           General transfer comment: supervision for safety    Ambulation/Gait Ambulation/Gait assistance: Supervision Gait Distance (Feet): 200 Feet Assistive device: Rollator (4 wheels) Gait Pattern/deviations: Step-through pattern, Decreased stride length Gait velocity: adequate Gait velocity  interpretation: >2.62 ft/sec, indicative of community ambulatory   General Gait Details: supervision for safety  Stairs Stairs: Yes Stairs assistance: Min assist Stair Management: One rail Left, Step to pattern, Forwards (+hand held on other side) Number of Stairs: 3 General stair comments: Assist for balance and support  Wheelchair Mobility    Modified Rankin (Stroke Patients Only)       Balance Overall balance assessment: Mild deficits observed, not formally tested                                           Pertinent Vitals/Pain Pain Assessment Pain Assessment: Faces Faces Pain Scale: Hurts a little bit Pain Location: LLE with stairs Pain Descriptors / Indicators: Grimacing Pain Intervention(s): Limited activity within patient's tolerance, Monitored during session    Home Living Family/patient expects to be discharged to:: Private residence Living Arrangements: Children Available Help at Discharge: Family;Available 24 hours/day Type of Home: House Home Access: Level entry     Alternate Level Stairs-Number of Steps: full flight Home Layout: Two level;1/2 bath on main level;Bed/bath upstairs;Other (Comment);Able to live on main level with bedroom/bathroom Sales executive is upstairs.) Home Equipment: Rollator (4 wheels);Cane - single point Additional Comments: Uses the [facility] mainly    Prior Function Prior Level of Function : Independent/Modified Independent;Driving             Mobility Comments: Uses SPC majority of the time.  RW use depends on chronic B knee arthritic pain. ADLs Comments: Ind with ADL,  and participates with iADL.  Continues to drive.     Hand Dominance   Dominant Hand: Right    Extremity/Trunk Assessment   Upper Extremity Assessment Upper Extremity Assessment: Defer to OT evaluation    Lower Extremity Assessment Lower Extremity Assessment: Generalized weakness    Cervical / Trunk Assessment Cervical / Trunk Assessment:  Normal  Communication   Communication: No difficulties  Cognition Arousal/Alertness: Awake/alert Behavior During Therapy: WFL for tasks assessed/performed Overall Cognitive Status: Within Functional Limits for tasks assessed                                          General Comments General comments (skin integrity, edema, etc.): VSS on RA    Exercises     Assessment/Plan    PT Assessment Patient does not need any further PT services  PT Problem List         PT Treatment Interventions      PT Goals (Current goals can be found in the Care Plan section)  Acute Rehab PT Goals PT Goal Formulation: All assessment and education complete, DC therapy    Frequency       Co-evaluation               AM-PAC PT "6 Clicks" Mobility  Outcome Measure Help needed turning from your back to your side while in a flat bed without using bedrails?: None Help needed moving from lying on your back to sitting on the side of a flat bed without using bedrails?: None Help needed moving to and from a bed to a chair (including a wheelchair)?: A Little Help needed standing up from a chair using your arms (e.g., wheelchair or bedside chair)?: A Little Help needed to walk in hospital room?: A Little Help needed climbing 3-5 steps with a railing? : A Little 6 Click Score: 20    End of Session Equipment Utilized During Treatment: Gait belt Activity Tolerance: Patient tolerated treatment well Patient left: in bed;with call bell/phone within reach;with family/visitor present Nurse Communication: Mobility status;Other (comment) (IV site ?oozing) PT Visit Diagnosis: Other abnormalities of gait and mobility (R26.89)    Time: 1117-1130 PT Time Calculation (min) (ACUTE ONLY): 13 min   Charges:   PT Evaluation $PT Eval Low Complexity: [place] 07/30/2021, 12:17 PM

## 2021-07-30 NOTE — Progress Notes (Signed)
ANTICOAGULATION CONSULT NOTE  Pharmacy Consult for Heparin Indication: DVT  No Known Allergies  Patient Measurements: Height: '5\' 3"'$  (160 cm) Weight: 79 kg (174 lb 2.6 oz) IBW/kg (Calculated) : 52.4 Heparin Dosing Weight: 69.9 kg  Vital Signs: Temp: 97.9 F (36.6 C) (07/01 0800) Temp Source: Oral (07/01 0800) BP: 115/61 (07/01 0800) Pulse Rate: 75 (07/01 0800)  Labs: Recent Labs    07/29/21 1035 07/29/21 1900 07/29/21 2157 07/30/21 0549  HGB 13.6  --  11.4*  --   HCT 42.5  --  32.2*  --   PLT 260  --  229  --   LABPROT  --  15.4*  --   --   INR  --  1.2  --   --   HEPARINUNFRC  --   --   --  >1.10*  CREATININE 1.14*  --   --   --      Estimated Creatinine Clearance: 47.6 mL/min (A) (by C-G formula based on SCr of 1.14 mg/dL (H)).   Assessment: 66 y.o. female admitted with LLE DVT s/p thrombectomy, started on IV heparin overnight. Pharmacy asked to transition to rivaroxaban.  Goal of Therapy:  Heparin level 0.3-0.7 units/ml Monitor platelets by anticoagulation protocol: Yes   Plan:  Stop heparin Rivaroxaban '15mg'$  BID x21 days then '20mg'$  qSupper  Arrie Senate, PharmD, BCPS, [facility] Clinical Pharmacist [phone] Please check AMION for all [facility] Pharmacy numbers 07/30/2021

## 2021-07-31 MED ORDER — RIVAROXABAN 20 MG PO TABS: 20.0000 mg | ORAL_TABLET | Freq: Every day | ORAL | 5 refills | Status: DC

## 2021-07-31 MED ORDER — RIVAROXABAN 15 MG PO TABS: 15.0000 mg | ORAL_TABLET | Freq: Two times a day (BID) | ORAL | 0 refills | Status: DC

## 2021-07-31 MED ORDER — OXYCODONE-ACETAMINOPHEN 5-325 MG PO TABS: 1.0000 | ORAL_TABLET | Freq: Four times a day (QID) | ORAL | 0 refills | Status: DC | PRN

## 2021-07-31 NOTE — Progress Notes (Signed)
MD notified of 12 beat run [place].  Patient asymptomatic, VSS. Will continue to monitor.

## 2021-07-31 NOTE — Progress Notes (Signed)
[facility] OF [place] PROGRESS NOTE  ASSESSMENT / PLAN: Ashley Price is a 66 y.o. 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 Doing very well. Symptoms completely resolved. Will plan to discharge today. Plan a six month course of Xarelto. Follow up in my office in next 1-2 weeks for suture removal.   SUBJECTIVE: Feels ready to go home.  OBJECTIVE: BP 120/73 (BP Location: Left Arm)   Pulse 81   Temp 98.3 F (36.8 C) (Oral)   Resp 18   Ht '5\' 3"'$  (1.6 m)   Wt 79 kg   SpO2 98%   BMI 30.85 kg/m   Intake/Output Summary (Last 24 hours) at 07/31/2021 0904 Last data filed at 07/30/2021 1827 Gross per 24 hour  Intake 10 ml  Output 75 ml  Net -65 ml    No acute distress Clean bandage Foley in place Left leg skin appearance and swelling much improved Left leg warm and well perfused     Latest Ref Rng & Units 07/29/2021    9:57 PM 07/29/2021   10:35 AM 12/20/2018    1:11 PM  CBC  WBC 4.0 - 10.5 K/uL 19.0  13.9  16.7   Hemoglobin 12.0 - 15.0 g/dL 11.4  13.6  15.6   Hematocrit 36.0 - 46.0 % 32.2  42.5  47.2   Platelets 150 - 400 K/uL 229  260  303         Latest Ref Rng & Units 07/30/2021    8:57 AM 07/29/2021   10:35 AM 12/20/2018    2:11 PM  CMP  Glucose 70 - 99 mg/dL 136  207    BUN 8 - 23 mg/dL 12  15    Creatinine 0.44 - 1.00 mg/dL 0.94  1.14    Sodium 135 - 145 mmol/L 136  134    Potassium 3.5 - 5.1 mmol/L 3.5  3.8    Chloride 98 - 111 mmol/L 106  98    CO2 22 - 32 mmol/L 21  19    Calcium 8.9 - 10.3 mg/dL 8.4  9.6    Total Protein 6.5 - 8.1 g/dL 5.7  8.5  8.1   Total Bilirubin 0.3 - 1.2 mg/dL 0.3  0.7  0.6   Alkaline Phos 38 - 126 U/L 50  75  80   AST 15 - 41 U/L '17  20  15   '$ ALT 0 - 44 U/L '13  15  10     '$ Estimated Creatinine Clearance: 57.8 mL/min (by C-G formula based on SCr of 0.94 mg/dL).  Ashley Aline. Stanford Breed, MD [facility] Phone Number: [phone] 07/31/2021  9:04 AM

## 2021-08-01 ENCOUNTER — Encounter (HOSPITAL_COMMUNITY): Payer: Self-pay | Admitting: Vascular Surgery

## 2021-08-01 NOTE — Discharge Summary (Signed)
Discharge Summary  Patient ID: Ashley Price [id number] 66 y.o. 1955-03-22  Admit date: 07/29/2021  Discharge date and time: 07/31/2021 10:42 AM   Admitting Physician: Cherre Robins, MD   Discharge Physician: Jamelle Haring, MD  Admission Diagnoses: Blood clot in vein [I82.90] Phlegmasia cerulea dolens of left lower extremity ([place]) [I80.202]  Discharge Diagnoses: Deep vein thrombosis; Phlegmasia cerulea dolens of the left lower extremity  Admission Condition: poor  Discharged Condition: fair  Indication for Admission: Ashley Price is a 66 y.o. female with extensive left lower extremity deep venous thrombosis extending from common iliac vein to tibial veins. This was causing her to feel systemically unwell with a cold, blue, mottled lower extremity. I was concerned she had early phlegmasia cerulea dolens. After careful discussion of risks, benefits, and alternatives the patient was offered venous thrombectomy. The patient understood and wished to proceed.   Hospital Course:  66 year old female was admitted on 07/29/21 and underwent the following: 1)ultrasound guided access, left popliteal vein 2) intravascular ultrasound of inferior vena cava, left common iliac vein, left external iliac vein, left common femoral vein, left femoral vein 3) left lower extremity, central venogram, pulmonary angiogram (182m total contrast) 4) mechanical thrombectomy of inferior vena cava, left common iliac vein, left external iliac vein, left common femoral vein, left femoral vein By Dr. HStanford Breed She tolerated the procedure well and was taken to the recovery room in stable condition  Post operatively she was maintained on IV heparin. By Post op day 1, she did well overnight. Her left leg was feeling better. Left leg skin and swelling much improved. Pain well controlled. She had good urine output. She was transitioned back to Xarelto from the heparin. She was evaluated and cleared by OT/PT.   By POD#2,  she remained stable for discharge home.  Her left leg symptoms completely resolved. She will remain on Xarelto for 6 months. PDMP was reviewed and post operative pain medication sent to patients pharmacy. Outpatient follow up arranged in 1-2 weeks with Duplex and suture removal.    Consults: None   Treatments: analgesia: acetaminophen, anticoagulation: heparin and Xarelto, and surgery: 1) ultrasound guided access, left popliteal vein 2) intravascular ultrasound of inferior vena cava, left common iliac vein, left external iliac vein, left common femoral vein, left femoral vein 3) left lower extremity, central venogram, pulmonary angiogram (1660mtotal contrast) 4) mechanical thrombectomy of inferior vena cava, left common iliac vein, left external iliac vein, left common femoral vein, left femoral vein   Disposition: Discharge disposition: 01-Home or Self Care       Patient Instructions:  Allergies as of 07/31/2021   No Known Allergies      Medication List     STOP taking these medications    lisinopril-hydrochlorothiazide 20-12.5 MG tablet Commonly known as: ZESTORETIC       TAKE these medications    aspirin EC 81 MG tablet Take 1 tablet (81 mg total) by mouth daily.   atorvastatin 40 MG tablet Commonly known as: LIPITOR Take 1 tablet (40 mg total) by mouth daily at 6 PM.   donepezil 5 MG tablet Commonly known as: ARICEPT Take 5 mg by mouth at bedtime.   lisinopril 10 MG tablet Commonly known as: ZESTRIL Take 10 mg by mouth daily.   metFORMIN 500 MG tablet Commonly known as: Glucophage Take 1 tablet (500 mg total) by mouth daily with breakfast.   oxyCODONE-acetaminophen 5-325 MG tablet Commonly known as: Percocet Take 1 tablet by mouth  every 6 (six) hours as needed for severe pain.   Rivaroxaban 15 MG Tabs tablet Commonly known as: XARELTO Take 1 tablet (15 mg total) by mouth 2 (two) times daily with a meal.   rivaroxaban 20 MG Tabs tablet Commonly  known as: XARELTO Take 1 tablet (20 mg total) by mouth daily with supper. Start taking on: August 20, 2021               Discharge Care Instructions  (From admission, onward)           Start     Ordered   07/31/21 0000  No dressing needed       Comments: Wash access site with mild soap and water, pat dry. Do not soak in bathtub   07/31/21 0919           Activity: activity as tolerated, no driving while on analgesics, and no heavy lifting for 4 weeks Diet: regular diet Wound Care:  keep incision clean and dry, wash with mild soap and water, pat dry. Do not soak in bathtub  Follow-up with Dr. Stanford Breed in 1 month.  SignedKaroline Caldwell, PA-C 08/01/2021 11:33 AM [facility] Office: [phone]

## 2021-08-02 ENCOUNTER — Encounter (HOSPITAL_COMMUNITY): Payer: Self-pay | Admitting: Vascular Surgery

## 2021-08-03 ENCOUNTER — Ambulatory Visit: Payer: Medicare Other | Admitting: Podiatry

## 2021-08-09 ENCOUNTER — Ambulatory Visit (INDEPENDENT_AMBULATORY_CARE_PROVIDER_SITE_OTHER): Payer: Medicare Other | Admitting: Physician Assistant

## 2021-08-09 ENCOUNTER — Other Ambulatory Visit: Payer: Self-pay

## 2021-08-09 VITALS — BP 112/66 | HR 68 | Temp 97.3°F | Resp 18 | Ht 63.0 in | Wt 165.7 lb

## 2021-08-09 DIAGNOSIS — M7989 Other specified soft tissue disorders: Secondary | ICD-10-CM

## 2021-08-09 DIAGNOSIS — I82412 Acute embolism and thrombosis of left femoral vein: Secondary | ICD-10-CM

## 2021-08-09 DIAGNOSIS — I8290 Acute embolism and thrombosis of unspecified vein: Secondary | ICD-10-CM

## 2021-08-09 DIAGNOSIS — I739 Peripheral vascular disease, unspecified: Secondary | ICD-10-CM

## 2021-08-09 NOTE — Progress Notes (Signed)
  POST OPERATIVE OFFICE NOTE    CC:  F/u for surgery  HPI:  This is a 66 y.o. female who presented with extensive left lower extremity deep venous thrombosis concerning for early phlegmasia.  s/p mechanical thrombectomy of inferior vena cava, left common iliac vein, left external iliac vein, left common femoral vein, left femoral vein on 07/29/21 by Dr. Stanford Breed.    Pt returns today for follow up.  Pt states She is doing well over all.  No recurrent sever edema.  She denies claudication, rest pain or non healing wounds.    She was here today for posterior popliteal suture removal and exam.    No Known Allergies  Current Outpatient Medications  Medication Sig Dispense Refill   aspirin EC 81 MG EC tablet Take 1 tablet (81 mg total) by mouth daily. 30 tablet 0   atorvastatin (LIPITOR) 40 MG tablet Take 1 tablet (40 mg total) by mouth daily at 6 PM. 30 tablet 0   donepezil (ARICEPT) 5 MG tablet Take 5 mg by mouth at bedtime.     lisinopril (ZESTRIL) 10 MG tablet Take 10 mg by mouth daily.     metFORMIN (GLUCOPHAGE) 500 MG tablet Take 1 tablet (500 mg total) by mouth daily with breakfast. 30 tablet 0   oxyCODONE-acetaminophen (PERCOCET) 5-325 MG tablet Take 1 tablet by mouth every 6 (six) hours as needed for severe pain. 12 tablet 0   Rivaroxaban (XARELTO) 15 MG TABS tablet Take 1 tablet (15 mg total) by mouth 2 (two) times daily with a meal. 42 tablet 0   [START ON 08/20/2021] rivaroxaban (XARELTO) 20 MG TABS tablet Take 1 tablet (20 mg total) by mouth daily with supper. 30 tablet 5   No current facility-administered medications for this visit.     ROS:  See HPI  Physical Exam:    Incision:  well healed , suture removed patient tolerated this well Extremities:  mild edema, soft.  Left second toe amputation healed from prior incident, doppler signal weak PT.  No open wounds. Neuro: sensation intact left LE     Assessment/Plan:  This is a 66 y.o. female who is s/p:extensive left lower  extremity deep venous thrombosis concerning for early phlegmasia.  s/p mechanical thrombectomy of inferior vena cava, left common iliac vein, left external iliac vein, left common femoral vein, left femoral vein on 07/29/21 by Dr. Stanford Breed.    She will f.u in 3-4 weeks for venous duplex and ABI to get an arterial duplex.  Left second toe amputation by DR. Chandler 08/18/18 for Left second toe septic joint, osteomyelitis    Roxy Horseman PA-C [facility] [phone]   Clinic MD:  Stanford Breed

## 2021-08-26 NOTE — Progress Notes (Deleted)
HISTORY AND PHYSICAL     CC:  follow up. Requesting Provider:  Donald Prose, MD  HPI: This is a 66 y.o. 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  Pt has hx of angiogram LLE, central venogram, pulmonary angiogram, mechanical thrombectomy of IVC, left CIV, EIV, CFV, FV on 07/29/2021 by Dr. Stanford Breed.  She did get good technical results with a large burden of thrombus cleared from iliac and femoral veins.  Pulmonary angiogram did not identify any large PE.  On POD 1, she was doing well as her left leg felt much better and she was transitioned from heparin to Xarelto.  By POD 2, her sx were completely resolved and she was discharged.    Pt was last seen in our office on 7/11/202 and at that time, she was doing well without severe recurrent edema.  She did have her popliteal suture removed.   The pt returns today for follow up.  ***  The pt is on a statin for cholesterol management.    The pt is on an aspirin.    Other AC:  Xarelto The pt is not on medication for hypertension.  The pt does not have diabetes. Tobacco hx:  former  Pt does *** have family hx of AAA.  Past Medical History:  Diagnosis Date   Hypertension    Osteoarthritis    Stroke [facility])     Past Surgical History:  Procedure Laterality Date   AMPUTATION Left 08/18/2018   Procedure: AMPUTATION LEFT 2ND TOE;  Surgeon: Tania Ade, MD;  Location: [place];  Service: Orthopedics;  Laterality: Left;   PERCUTANEOUS VENOUS THROMBECTOMY,LYSIS WITH INTRAVASCULAR ULTRASOUND (IVUS) Left 07/29/2021   Procedure: PERCUTANEOUS LEFT VENOUS THROMBECTOMY;  Surgeon: Cherre Robins, MD;  Location: [facility];  Service: Vascular;  Laterality: Left;   TUBAL LIGATION Bilateral     No Known Allergies  Current Outpatient Medications  Medication Sig Dispense Refill   aspirin EC 81 MG EC tablet Take 1 tablet (81 mg total) by mouth daily. 30 tablet 0   atorvastatin (LIPITOR)  40 MG tablet Take 1 tablet (40 mg total) by mouth daily at 6 PM. 30 tablet 0   donepezil (ARICEPT) 5 MG tablet Take 5 mg by mouth at bedtime.     lisinopril (ZESTRIL) 10 MG tablet Take 10 mg by mouth daily.     metFORMIN (GLUCOPHAGE) 500 MG tablet Take 1 tablet (500 mg total) by mouth daily with breakfast. 30 tablet 0   oxyCODONE-acetaminophen (PERCOCET) 5-325 MG tablet Take 1 tablet by mouth every 6 (six) hours as needed for severe pain. 12 tablet 0   Rivaroxaban (XARELTO) 15 MG TABS tablet Take 1 tablet (15 mg total) by mouth 2 (two) times daily with a meal. 42 tablet 0   rivaroxaban (XARELTO) 20 MG TABS tablet Take 1 tablet (20 mg total) by mouth daily with supper. 30 tablet 5   No current facility-administered medications for this visit.    Family History  Problem Relation Age of Onset   Breast cancer Mother    Kidney failure Brother     Social History   Socioeconomic History   Marital status: Single    Spouse name: Not on file   Number of children: Not on file   Years of education: Not on file   Highest education level: Not on file  Occupational History   Not on file  Tobacco Use  Smoking status: Former    Types: Cigarettes    Quit date: 12/30/2018    Years since quitting: 2.6   Smokeless tobacco: Never  Vaping Use   Vaping Use: Never used  Substance and Sexual Activity   Alcohol use: No   Drug use: No   Sexual activity: Not on file  Other Topics Concern   Not on file  Social History Narrative   Lives with her children   Social Determinants of Health   Financial Resource Strain: Not on file  Food Insecurity: Not on file  Transportation Needs: Not on file  Physical Activity: Not on file  Stress: Not on file  Social Connections: Not on file  Intimate Partner Violence: Not on file     REVIEW OF SYSTEMS:  *** '[X]'$  denotes positive finding, '[ ]'$  denotes negative finding Cardiac  Comments:  Chest pain or chest pressure:    Shortness of breath upon exertion:     Short of breath when lying flat:    Irregular heart rhythm:        Vascular    Pain in calf, thigh, or hip brought on by ambulation:    Pain in feet at night that wakes you up from your sleep:     Blood clot in your veins:    Leg swelling:         Pulmonary    Oxygen at home:    Productive cough:     Wheezing:         Neurologic    Sudden weakness in arms or legs:     Sudden numbness in arms or legs:     Sudden onset of difficulty speaking or slurred speech:    Temporary loss of vision in one eye:     Problems with dizziness:         Gastrointestinal    Blood in stool:     Vomited blood:         Genitourinary    Burning when urinating:     Blood in urine:        Psychiatric    Major depression:         Hematologic    Bleeding problems:    Problems with blood clotting too easily:        Skin    Rashes or ulcers:        Constitutional    Fever or chills:      PHYSICAL EXAMINATION:  ***  General:  WDWN in NAD; vital signs documented above Gait: Not observed HENT: WNL, normocephalic Pulmonary: normal non-labored breathing , without wheezing Cardiac: {Desc; regular/irreg:14544} HR, {With/Without:20273} carotid bruit*** Abdomen: soft, NT, no masses; aortic pulse is *** palpable Skin: {With/Without:20273} rashes Vascular Exam/Pulses:  Right Left  Radial {Exam; arterial pulse strength 0-4:30167} {Exam; arterial pulse strength 0-4:30167}  Femoral {Exam; arterial pulse strength 0-4:30167} {Exam; arterial pulse strength 0-4:30167}  Popliteal {Exam; arterial pulse strength 0-4:30167} {Exam; arterial pulse strength 0-4:30167}  DP {Exam; arterial pulse strength 0-4:30167} {Exam; arterial pulse strength 0-4:30167}  PT {Exam; arterial pulse strength 0-4:30167} {Exam; arterial pulse strength 0-4:30167}  Peroneal *** ***   Extremities: {With/Without:20273} ischemic changes, {With/Without:20273} Gangrene , {With/Without:20273} cellulitis; {With/Without:20273} open  wounds Musculoskeletal: no muscle wasting or atrophy  Neurologic: A&O X 3 Psychiatric:  The pt has {Desc; normal/abnormal:11317::"Normal"} affect.   Non-Invasive Vascular Imaging:   IVC/Iliac venous duplex on 09/07/2021: Right:  *** - Great toe pressure: *** Left:  *** - Great toe pressure: ***  Lower extremity venous duplex on 09/07/2021: ***  ABI's/TBI's on 09/07/2021: Right:  *** - Great toe pressure: *** Left:  *** - Great toe pressure:  ***    ASSESSMENT/PLAN:: 66 y.o. 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  Pt has hx of angiogram LLE, central venogram, pulmonary angiogram, mechanical thrombectomy of IVC, left CIV, EIV, CFV, FV on 07/29/2021 by Dr. Stanford Breed.   -*** -continue *** -pt will f/u in *** with ***.   Leontine Locket, [facility] [phone]  Clinic MD:   Donzetta Matters

## 2021-09-05 DIAGNOSIS — M7989 Other specified soft tissue disorders: Secondary | ICD-10-CM | POA: Insufficient documentation

## 2021-09-06 ENCOUNTER — Ambulatory Visit
Admission: RE | Admit: 2021-09-06 | Discharge: 2021-09-06 | Disposition: A | Discharge status: Home / Self Care | Payer: Medicare Other | Source: Ambulatory Visit | Attending: Family Medicine | Admitting: Family Medicine

## 2021-09-06 DIAGNOSIS — E2839 Other primary ovarian failure: Secondary | ICD-10-CM

## 2021-09-07 ENCOUNTER — Ambulatory Visit (INDEPENDENT_AMBULATORY_CARE_PROVIDER_SITE_OTHER): Payer: Medicare Other | Admitting: Physician Assistant

## 2021-09-07 ENCOUNTER — Ambulatory Visit (INDEPENDENT_AMBULATORY_CARE_PROVIDER_SITE_OTHER)
Admission: RE | Admit: 2021-09-07 | Discharge: 2021-09-07 | Disposition: A | Discharge status: Home / Self Care | Payer: Medicare Other | Source: Ambulatory Visit | Attending: Vascular Surgery | Admitting: Vascular Surgery

## 2021-09-07 ENCOUNTER — Other Ambulatory Visit: Payer: Self-pay | Admitting: Vascular Surgery

## 2021-09-07 ENCOUNTER — Ambulatory Visit (HOSPITAL_COMMUNITY)
Admission: RE | Admit: 2021-09-07 | Discharge: 2021-09-07 | Disposition: A | Discharge status: Home / Self Care | Payer: Medicare Other | Source: Ambulatory Visit | Attending: Vascular Surgery | Admitting: Vascular Surgery

## 2021-09-07 VITALS — BP 186/91 | HR 61 | Temp 96.7°F | Resp 20 | Ht 63.0 in | Wt 161.6 lb

## 2021-09-07 DIAGNOSIS — M85852 Other specified disorders of bone density and structure, left thigh: Secondary | ICD-10-CM | POA: Insufficient documentation

## 2021-09-07 DIAGNOSIS — I739 Peripheral vascular disease, unspecified: Secondary | ICD-10-CM | POA: Insufficient documentation

## 2021-09-07 DIAGNOSIS — Z86718 Personal history of other venous thrombosis and embolism: Secondary | ICD-10-CM

## 2021-09-07 DIAGNOSIS — E2839 Other primary ovarian failure: Secondary | ICD-10-CM | POA: Insufficient documentation

## 2021-09-07 DIAGNOSIS — E440 Moderate protein-calorie malnutrition: Secondary | ICD-10-CM | POA: Insufficient documentation

## 2021-09-07 DIAGNOSIS — I8290 Acute embolism and thrombosis of unspecified vein: Secondary | ICD-10-CM

## 2021-09-07 DIAGNOSIS — R413 Other amnesia: Secondary | ICD-10-CM | POA: Insufficient documentation

## 2021-09-07 NOTE — Progress Notes (Signed)
[facility] [place]  OFFICE NOTE  HISTORY AND PHYSICAL:   Ashley Price is a 66 y.o. 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  Pt underwent LLE angiogram, central venogram, pulmonary angiogram, and mechanical thrombectomy of IVC, left CIV, EIV, CFV, FV on 07/29/2021 by Dr. Stanford Breed. She received good technical results from mechanical thrombectomy with a large burden of thrombus cleared from the left iliac and femoral veins. The pulmonary angiogram did not identify any large PE.  The patient was transitioned from heparin to Xarelto on POD 1, and she was to continue at least a 6 month course of the Xarelto. The patient's symptoms were completely resolved on POD 2 and she was discharged.  She was last seen by our office for a post op visit on 08/09/2021. At that time her symptoms were not recurring and she was experiencing some mild lower extremity edema. She had her left popliteal sutures removed without issue.   Today she is here and reports that she has been feeling great.  She denies leg swelling, pain in her legs, rest pain, nonhealing wounds of the lower extremities.  She denies chest pain or shortness of breath.  She has still been taking her Xarelto.  She has no new changes to her medical history.  Past Medical History:  Diagnosis Date   Hypertension    Osteoarthritis    Stroke [facility])     Past Surgical History:  Procedure Laterality Date   AMPUTATION Left 08/18/2018   Procedure: AMPUTATION LEFT 2ND TOE;  Surgeon: Tania Ade, MD;  Location: [place];  Service: Orthopedics;  Laterality: Left;   PERCUTANEOUS VENOUS THROMBECTOMY,LYSIS WITH INTRAVASCULAR ULTRASOUND (IVUS) Left 07/29/2021   Procedure: PERCUTANEOUS LEFT VENOUS THROMBECTOMY;  Surgeon: Cherre Robins, MD;  [facility];  Service: Vascular;  Laterality: Left;   TUBAL LIGATION Bilateral     ROS:   General:  No  weight loss, Fever, chills  Cardiac: No recent episodes of chest pain/pressure, no shortness of breath at rest.  No shortness of breath with exertion.  Denies history of atrial fibrillation or irregular heartbeat  Vascular: History of DVT. No history of rest pain, claudication, non healing wounds of the lower extremities  Pulmonary: No home oxygen, no productive cough, no hemoptysis. No asthma or wheezing  Musculoskeletal:  '[ ]'$  Arthritis, '[ ]'$  Low back pain,  '[ ]'$  Joint pain  Hematologic:No history of hypercoagulable state.  No history of easy bleeding.  No history of anemia  Social History Social History   Tobacco Use   Smoking status: Former    Types: Cigarettes    Quit date: 12/30/2018    Years since quitting: 2.6   Smokeless tobacco: Never  Vaping Use   Vaping Use: Never used  Substance Use Topics   Alcohol use: No   Drug use: No    Family History Family History  Problem Relation Age of Onset   Breast cancer Mother    Kidney failure Brother     Allergies  No Known Allergies   Current Outpatient Medications  Medication Sig Dispense Refill   aspirin EC 81 MG EC tablet Take 1 tablet (81 mg total) by mouth daily. 30 tablet 0   atorvastatin (LIPITOR) 40 MG tablet Take 1 tablet (40 mg total) by mouth daily at 6 PM. 30 tablet 0   donepezil (ARICEPT) 5 MG tablet Take 5 mg by mouth at  bedtime.     lisinopril (ZESTRIL) 10 MG tablet Take 10 mg by mouth daily.     metFORMIN (GLUCOPHAGE) 500 MG tablet Take 1 tablet (500 mg total) by mouth daily with breakfast. 30 tablet 0   oxyCODONE-acetaminophen (PERCOCET) 5-325 MG tablet Take 1 tablet by mouth every 6 (six) hours as needed for severe pain. 12 tablet 0   Rivaroxaban (XARELTO) 15 MG TABS tablet Take 1 tablet (15 mg total) by mouth 2 (two) times daily with a meal. 42 tablet 0   rivaroxaban (XARELTO) 20 MG TABS tablet Take 1 tablet (20 mg total) by mouth daily with supper. 30 tablet 5   No current facility-administered  medications for this visit.    Physical Examination  Vitals:   09/07/21 0932  BP: (!) 186/91  Pulse: 61  Resp: 20  Temp: (!) 96.7 F (35.9 C)  TempSrc: Temporal  SpO2: 100%  Weight: 161 lb 9.6 oz (73.3 kg)  Height: '5\' 3"'$  (1.6 m)    Body mass index is 28.63 kg/m.  General:  Alert and oriented, no acute distress HEENT: Normal Pulmonary: Clear to auscultation bilaterally Cardiac: Regular r2ate and rhythm without murmur Abdomen: Soft, non-tender, non-distended, no mass, no scars Skin: No rash Extremity Pulses:  Pedal pulses are not palpable bilaterally, however feet are warm and well perfused Musculoskeletal: No deformity or edema  Neurologic: Upper and lower extremity motor 5/5 and symmetric   NON INVASIVE STUDIES:   VENOUS US IVC/ILIAC:  IVC/Iliac Findings:  +----------+------+--------+--------+     IVC    PatentThrombusComments  +----------+------+--------+--------+  IVC Prox  patent                  +----------+------+--------+--------+  IVC Mid   patent                  +----------+------+--------+--------+  IVC Distalpatent                  +----------+------+--------+--------+      +-------------------+---------+-----------+---------+-----------+--------+          CIV        RT-PatentRT-ThrombusLT-PatentLT-ThrombusComments  +-------------------+---------+-----------+---------+-----------+--------+  Common Iliac Prox                       patent                       +-------------------+---------+-----------+---------+-----------+--------+  Common Iliac Mid                        patent                       +-------------------+---------+-----------+---------+-----------+--------+  Common Iliac Distal                     patent                       +-------------------+---------+-----------+---------+-----------+--------+       +-------------------------+---------+-----------+---------+-----------+----   ----+             EIV            RT-PatentRT-ThrombusLT-PatentLT-ThrombusComments  +-------------------------+---------+-----------+---------+-----------+----  ----+  External Iliac Vein Prox                      patent                         +-------------------------+---------+-----------+---------+-----------+----  ----+  External Iliac Vein Mid                       patent                         +-------------------------+---------+-----------+---------+-----------+----  ----+  External Iliac Vein                           patent                         Distal                                                                       +-------------------------+---------+-----------+---------+-----------+----  ----+   LOWER EXTREMITY DVT STUDY: +-----+---------------+---------+-----------+----------+--------------+  RIGHTCompressibilityPhasicitySpontaneityPropertiesThrombus Aging  +-----+---------------+---------+-----------+----------+--------------+  CFV  Full           Yes      Yes        patent                    +-----+---------------+---------+-----------+----------+--------------+           +--------+---------------+---------+-----------+--------------+------------  ---+  LEFT    CompressibilityPhasicitySpontaneityProperties    Thrombus  Aging   +--------+---------------+---------+-----------+--------------+------------  ---+  CFV     Full           Yes      Yes        patent                           +--------+---------------+---------+-----------+--------------+------------  ---+  SFJ     Full           Yes      Yes        patent                           +--------+---------------+---------+-----------+--------------+------------  ---+  FV Prox Full           Yes      Yes        patent                            +--------+---------------+---------+-----------+--------------+------------  ---+  FV Mid  Partial        Yes      Yes        softly        Chronic                                                      echogenic                        +--------+---------------+---------+-----------+--------------+------------  ---+  FV      Full           Yes      Yes  patent                           Distal                                                                      +--------+---------------+---------+-----------+--------------+------------  ---+  PFV     Full           Yes      Yes        patent                           +--------+---------------+---------+-----------+--------------+------------  ---+  POP     Full           Yes      Yes        patent                           +--------+---------------+---------+-----------+--------------+------------  ---+  PTV     Full                                                                +--------+---------------+---------+-----------+--------------+------------  ---+  PERO    Full                                                                +--------+---------------+---------+-----------+--------------+------------  ---+  SSV     None           No       No         dilated       Age                                                                          Indeterminate    +--------+---------------+---------+-----------+--------------+------------  ABI:  +---------+------------------+-----+----------+--------+  Right    Rt Pressure (mmHg)IndexWaveform  Comment   +---------+------------------+-----+----------+--------+  Brachial 201                    triphasic           +---------+------------------+-----+----------+--------+  PTA      112               0.56 monophasic          +---------+------------------+-----+----------+--------+  DP        128               0.64 monophasic          +---------+------------------+-----+----------+--------+  Great Toe95                0.47 Abnormal            +---------+------------------+-----+----------+--------+   +---------+------------------+-----+----------+-------+  Left     Lt Pressure (mmHg)IndexWaveform  Comment  +---------+------------------+-----+----------+-------+  Brachial 194                    triphasic          +---------+------------------+-----+----------+-------+  PTA      103               0.51 monophasic         +---------+------------------+-----+----------+-------+  DP       91                0.45 monophasic         +---------+------------------+-----+----------+-------+  Great Toe93                0.46 Abnormal           +---------+------------------+-----+----------+-------+   +-------+-----------+-----------+------------+------------+  ABI/TBIToday's ABIToday's TBIPrevious ABIPrevious TBI  +-------+-----------+-----------+------------+------------+  Right  0.64       0.47                                 +-------+-----------+-----------+------------+------------+  Left   0.51       0.46                                 +-------+-----------+-----------+------------+------------+    ASSESSMENT AND PLAN:  - Based off venous duplex study the patient's IVC, left common iliac, left external iliac, and left common femoral vein are patent without recurrent DVT.  Duplex study also reveals patent left femoral vein, popliteal vein, posterior tibial vein, and peroneal vein.  Findings are consistent with chronic superficial vein thrombosis involving the left small saphenous vein, the patient is asymptomatic. - The patient is pain-free and has not been experiencing any lower extremity swelling. -The patient's ABIs are abnormal with R 0.64 and L 0.51.  The patient has nonpalpable pedal pulses, however her feet are  warm and well-perfused.  The patient denies claudication, rest pain, nonhealing wounds of the lower extremities. - She will follow-up with our office in 6 months with DVT study of the IVC, left iliac, and left lower extremity.  She will continue her Xarelto. - She will also follow-up in 1 year with repeat ABIs.  She is aware to come back sooner if she starts developing any new symptoms.   Vicente Serene, PA-C [facility] Office: [phone]

## 2021-09-12 ENCOUNTER — Other Ambulatory Visit: Payer: Self-pay

## 2021-09-12 DIAGNOSIS — Z86718 Personal history of other venous thrombosis and embolism: Secondary | ICD-10-CM

## 2021-09-14 ENCOUNTER — Other Ambulatory Visit: Payer: Self-pay | Admitting: Family Medicine

## 2021-09-14 DIAGNOSIS — R413 Other amnesia: Secondary | ICD-10-CM | POA: Diagnosis not present

## 2021-09-14 DIAGNOSIS — I10 Essential (primary) hypertension: Secondary | ICD-10-CM | POA: Diagnosis not present

## 2021-09-14 DIAGNOSIS — Z86718 Personal history of other venous thrombosis and embolism: Secondary | ICD-10-CM | POA: Diagnosis not present

## 2021-09-14 DIAGNOSIS — F17210 Nicotine dependence, cigarettes, uncomplicated: Secondary | ICD-10-CM | POA: Diagnosis not present

## 2021-09-14 DIAGNOSIS — N6322 Unspecified lump in the left breast, upper inner quadrant: Secondary | ICD-10-CM

## 2021-09-14 DIAGNOSIS — E1140 Type 2 diabetes mellitus with diabetic neuropathy, unspecified: Secondary | ICD-10-CM | POA: Diagnosis not present

## 2021-09-14 DIAGNOSIS — N63 Unspecified lump in unspecified breast: Secondary | ICD-10-CM | POA: Diagnosis not present

## 2021-09-14 DIAGNOSIS — I739 Peripheral vascular disease, unspecified: Secondary | ICD-10-CM | POA: Diagnosis not present

## 2021-09-14 DIAGNOSIS — Z8673 Personal history of transient ischemic attack (TIA), and cerebral infarction without residual deficits: Secondary | ICD-10-CM | POA: Diagnosis not present

## 2021-09-28 ENCOUNTER — Ambulatory Visit
Admission: RE | Admit: 2021-09-28 | Discharge: 2021-09-28 | Disposition: A | Discharge status: Home / Self Care | Payer: Medicare Other | Source: Ambulatory Visit | Attending: Family Medicine | Admitting: Family Medicine

## 2021-09-28 DIAGNOSIS — N6322 Unspecified lump in the left breast, upper inner quadrant: Secondary | ICD-10-CM

## 2021-10-06 ENCOUNTER — Telehealth: Payer: Self-pay | Admitting: Neurology

## 2021-10-06 ENCOUNTER — Ambulatory Visit (INDEPENDENT_AMBULATORY_CARE_PROVIDER_SITE_OTHER): Payer: Medicare Other | Admitting: Neurology

## 2021-10-06 ENCOUNTER — Encounter: Payer: Self-pay | Admitting: Neurology

## 2021-10-06 VITALS — BP 150/82 | HR 58 | Ht 63.0 in | Wt 162.5 lb

## 2021-10-06 DIAGNOSIS — I6789 Other cerebrovascular disease: Secondary | ICD-10-CM

## 2021-10-06 DIAGNOSIS — Z8673 Personal history of transient ischemic attack (TIA), and cerebral infarction without residual deficits: Secondary | ICD-10-CM

## 2021-10-06 DIAGNOSIS — R413 Other amnesia: Secondary | ICD-10-CM

## 2021-10-06 DIAGNOSIS — G3184 Mild cognitive impairment, so stated: Secondary | ICD-10-CM

## 2021-10-06 DIAGNOSIS — E7489 Other specified disorders of carbohydrate metabolism: Secondary | ICD-10-CM | POA: Diagnosis not present

## 2021-10-06 DIAGNOSIS — I6782 Cerebral ischemia: Secondary | ICD-10-CM

## 2021-10-06 NOTE — Patient Instructions (Signed)
I had a long discussion with the patient and her daughter regarding her symptoms of memory and cognitive worsening which likely represent mild cognitive impairment likely as a late effect of her multiple previous lacunar strokes.  I recommend further evaluation with checking memory panel labs, EEG, MRI scan of the brain with MRAs and hemoglobin A1c.  I encouraged her to increase participation in cognitively challenging activities like solving crossword puzzles, playing bridge and sudoku.  We also discussed memory compensation strategies.  I recommend she discontinue Aricept as she has not been compliant with it.  Continue aspirin for stroke prevention and Xarelto for her DVT and maintain aggressive risk factor modification with strict control of hypertension with blood pressure goal below 130/90, lipids with LDL cholesterol goal below 70 mg percent and diabetes with hemoglobin A1c goal below 6.5%.  She will return for follow-up in the future in 3 months or call earlier if necessary.  Memory Compensation Strategies  Use "WARM" strategy.  W= write it down  A= associate it  R= repeat it  M= make a mental note  2.   You can keep a Social worker.  Use a 3-ring notebook with sections for the following: calendar, important names and phone numbers,  medications, doctors' names/phone numbers, lists/reminders, and a section to journal what you did  each day.   3.    Use a calendar to write appointments down.  4.    Write yourself a schedule for the day.  This can be placed on the calendar or in a separate section of the Memory Notebook.  Keeping a  regular schedule can help memory.  5.    Use medication organizer with sections for each day or morning/evening pills.  You may need help loading it  6.    Keep a basket, or pegboard by the door.  Place items that you need to take out with you in the basket or on the pegboard.  You may also want to  include a message board for reminders.  7.    Use sticky  notes.  Place sticky notes with reminders in a place where the task is performed.  For example: " turn off the  stove" placed by the stove, "lock the door" placed on the door at eye level, " take your medications" on  the bathroom mirror or by the place where you normally take your medications.  8.    Use alarms/timers.  Use while cooking to remind yourself to check on food or as a reminder to take your medicine, or as a  reminder to make a call, or as a reminder to perform another task, etc.

## 2021-10-06 NOTE — Telephone Encounter (Signed)
medicare/[employer] medicare NPR sent to GI

## 2021-10-06 NOTE — Progress Notes (Signed)
[facility] [address] [place]. [place] [address] [phone]       OFFICE CONSULT NOTE  Ashley Price Date of Birth:  1955-03-18 Medical Record Number:  [id number]   Referring MD:   Matilde Haymaker  Reason for Referral: Memory loss  HPI: Ashley Price is a 66 year old pleasant African-American lady seen today for office consultation visit for memory loss.  She is accompanied by her daughter.  History is obtained from them and review of referral notes and electronic medical records and opossum reviewed pertinent available imaging films in PACS.  She has past medical history of diabetes, pretension, hyperlipidemia, osteoarthritis and right medullary infarct in November 2020.  The patient has developed some memory and cognitive difficulties since December of last year.  She has had intermittent confusion and disorientation off-and-on.  She is challenged with learning any new technology.  For example she tried to order a meal through door [employer] app and could not figure out why it was not working.  Short-term memory is quite poor and she often needs information to be repeated multiple times.  She has been under increased stress for the last 1 year as she has lost her daughter, uncle and husband and may be in significant grief.  The daughter reports an episode in June this year when she was found to be staring into space with a glazed look on her face.  This was transient and she recovered quickly.  She saw her primary care physician who actually referred her to the ER where actually she was found to have a DVT in the legs and started on Xarelto which she is still taking.  She was started on Aricept but she has not been consistently taking it as per the daughter.  She was last seen in the office on 05/21/2019 by Janett Billow nurse practitioner.  Since then she had a CT scan of the head on 07/29/2021 which shows mild generalized atrophy and changes of small vessel disease.  Old bilateral  basal ganglia lacunar infarcts are noted.  No acute abnormalities noted.  She had echocardiogram in 07/30/2021 which showed ejection fraction of 60 to 65%.  LDL cholesterol on 07/30/2021 was 32 mg percent.  She remains on aspirin 81 mg which is tolerating well without bleeding or bruising.  She is also tolerating Lipitor 40 mg well without any side effects.  She remains on Glucophage for diabetes which appears well controlled.  She is now on Balto and is having minor bruising but no bleeding episodes.  On Mini-Mental status exam today she scored 28/30 with only deficits in recall.  On geriatric depression scale she was not depressed with a score of only 1.  ROS:   14 system review of systems is positive for memory loss, confusion, disorientation and all other systems negative  PMH:  Past Medical History:  Diagnosis Date   Hypertension    Osteoarthritis    Stroke [facility])     Social History:  Social History   Socioeconomic History   Marital status: Single    Spouse name: Not on file   Number of children: Not on file   Years of education: Not on file   Highest education level: Not on file  Occupational History   Not on file  Tobacco Use   Smoking status: Former    Types: Cigarettes    Quit date: 12/30/2018    Years since quitting: 2.7    Passive exposure: Never   Smokeless tobacco:  Never  Vaping Use   Vaping Use: Never used  Substance and Sexual Activity   Alcohol use: No   Drug use: No   Sexual activity: Not on file  Other Topics Concern   Not on file  Social History Narrative   Lives with her children   Social Determinants of Health   Financial Resource Strain: Not on file  Food Insecurity: Not on file  Transportation Needs: Not on file  Physical Activity: Not on file  Stress: Not on file  Social Connections: Not on file  Intimate Partner Violence: Not on file    Medications:   Current Outpatient Medications on File Prior to Visit  Medication Sig Dispense Refill    aspirin EC 81 MG EC tablet Take 1 tablet (81 mg total) by mouth daily. 30 tablet 0   atorvastatin (LIPITOR) 40 MG tablet Take 1 tablet (40 mg total) by mouth daily at 6 PM. 30 tablet 0   lisinopril (ZESTRIL) 10 MG tablet Take 10 mg by mouth daily.     metFORMIN (GLUCOPHAGE) 500 MG tablet Take 1 tablet (500 mg total) by mouth daily with breakfast. 30 tablet 0   oxyCODONE-acetaminophen (PERCOCET) 5-325 MG tablet Take 1 tablet by mouth every 6 (six) hours as needed for severe pain. 12 tablet 0   rivaroxaban (XARELTO) 20 MG TABS tablet Take 1 tablet (20 mg total) by mouth daily with supper. 30 tablet 5   No current facility-administered medications on file prior to visit.    Allergies:  No Known Allergies  Physical Exam General: well developed, well nourished, pleasant middle-aged African-American lady seated, in no evident distress Head: head normocephalic and atraumatic.   Neck: supple with no carotid or supraclavicular bruits Cardiovascular: regular rate and rhythm, no murmurs Musculoskeletal: no deformity Skin:  no rash/petichiae Vascular:  Normal pulses all extremities  Neurologic Exam Mental Status: Awake and fully alert. Oriented to place and time. Recent and remote memory intact. Attention span, concentration and fund of knowledge appropriate. Mood and affect appropriate.  Diminished recall 1/3.  Able to name only 7 animals which can walk on 4 legs.  Clock drawing 4/4.  Mini-Mental status exam score 28/30. Cranial Nerves: Fundoscopic exam reveals sharp disc margins. Pupils equal, briskly reactive to light. Extraocular movements full without nystagmus. Visual fields full to confrontation. Hearing intact. Facial sensation intact. Face, tongue, palate moves normally and symmetrically.  Motor: Normal bulk and tone. Normal strength in all tested extremity muscles. Sensory.: intact to touch , pinprick , position and vibratory sensation.  Coordination: Rapid alternating movements normal in all  extremities. Finger-to-nose and heel-to-shin performed accurately bilaterally. Gait and Station: Arises from chair without difficulty. Stance is normal. Gait demonstrates normal stride length and balance . Able to heel, toe and tandem walk without difficulty.  Reflexes: 1+ and symmetric. Toes downgoing.   NIHSS  0 Modified Rankin  1    10/06/2021    8:17 AM  MMSE - Mini Mental State Exam  Orientation to time 5  Orientation to Place 5  Registration 3  Attention/ Calculation 5  Recall 1  Language- name 2 objects 2  Language- repeat 1  Language- follow 3 step command 3  Language- read & follow direction 1  Write a sentence 1  Copy design 1  Total score 28     ASSESSMENT: 66 year old African-American lady with 32-monthhistory of progressive memory and cognitive difficulties likely due to mild cognitive impairment.  Prior history of right lateral medullary infarct in November  2020 due to small vessel disease with multiple vascular risk factors of diabetes, hypertension, hyperlipidemia, smoking and cerebrovascular disease.     PLAN:I had a long discussion with the patient and her daughter regarding her symptoms of memory and cognitive worsening which likely represent mild cognitive impairment likely as a late effect of her multiple previous lacunar strokes.  I recommend further evaluation with checking memory panel labs, EEG, MRI scan of the brain with MRAs and hemoglobin A1c.  I encouraged her to increase participation in cognitively challenging activities like solving crossword puzzles, playing bridge and sudoku.  We also discussed memory compensation strategies.  I recommend she discontinue Aricept as she has not been compliant with it.  Continue aspirin for stroke prevention and Xarelto for her DVT and maintain aggressive risk factor modification with strict control of hypertension with blood pressure goal below 130/90, lipids with LDL cholesterol goal below 70 mg percent and diabetes with  hemoglobin A1c goal below 6.5%.  She will return for follow-up in the future in 3 months or call earlier if necessary.  Greater than 50% time during this 45-minute consultation visit was spent in counseling and coordination of care about her memory loss and cognitive impairment discussion about evaluation and treatment plan and answering questions.  Antony Contras, MD Note: This document was prepared with digital dictation and possible smart phrase technology. Any transcriptional errors that result from this process are unintentional.

## 2021-10-07 LAB — DEMENTIA PANEL
Homocysteine: 20.7 umol/L — ABNORMAL HIGH (ref 0.0–17.2)
RPR Ser Ql: NONREACTIVE
TSH: 2.51 u[IU]/mL (ref 0.450–4.500)
Vitamin B-12: 582 pg/mL (ref 232–1245)

## 2021-10-07 LAB — HEMOGLOBIN A1C
Est. average glucose Bld gHb Est-mCnc: 126 mg/dL
Hgb A1c MFr Bld: 6 % — ABNORMAL HIGH (ref 4.8–5.6)

## 2021-10-07 NOTE — Progress Notes (Signed)
Kindly inform patient that lab work for reversible causes of memory loss as well as screening test for diabetes were both satisfactory

## 2021-10-22 NOTE — Progress Notes (Signed)
Kindly contact the patient as she has not yet received MyChart lab results

## 2021-10-24 ENCOUNTER — Ambulatory Visit
Admission: RE | Admit: 2021-10-24 | Discharge: 2021-10-24 | Disposition: A | Discharge status: Home / Self Care | Payer: Medicare Other | Source: Ambulatory Visit | Attending: Neurology | Admitting: Neurology

## 2021-10-24 DIAGNOSIS — R413 Other amnesia: Secondary | ICD-10-CM | POA: Diagnosis not present

## 2021-10-24 MED ORDER — GADOBENATE DIMEGLUMINE 529 MG/ML IV SOLN
15.0000 mL | Freq: Once | INTRAVENOUS | Status: AC | PRN
  Administered 2021-10-24: 15 mL via INTRAVENOUS

## 2021-11-04 NOTE — Progress Notes (Signed)
Kindly inform the patient that MR angiogram study of the blood vessels of the neck and the brain did not show any major blockages to worry about.

## 2021-11-04 NOTE — Progress Notes (Signed)
Kindly inform the patient that MRI scans of brain shows no new or worrisome findings.  Small strokes in the deep in the back portions of the brain are noted which are unchanged from previous study

## 2021-11-08 ENCOUNTER — Encounter: Payer: Self-pay | Admitting: Internal Medicine

## 2021-11-08 ENCOUNTER — Ambulatory Visit (INDEPENDENT_AMBULATORY_CARE_PROVIDER_SITE_OTHER): Payer: Medicare Other | Admitting: Internal Medicine

## 2021-11-08 VITALS — BP 132/76 | HR 78 | Temp 98.3°F | Ht 63.0 in | Wt 163.0 lb

## 2021-11-08 DIAGNOSIS — D539 Nutritional anemia, unspecified: Secondary | ICD-10-CM | POA: Diagnosis not present

## 2021-11-08 DIAGNOSIS — I10 Essential (primary) hypertension: Secondary | ICD-10-CM | POA: Diagnosis not present

## 2021-11-08 DIAGNOSIS — E1151 Type 2 diabetes mellitus with diabetic peripheral angiopathy without gangrene: Secondary | ICD-10-CM | POA: Diagnosis not present

## 2021-11-08 DIAGNOSIS — Z720 Tobacco use: Secondary | ICD-10-CM

## 2021-11-08 LAB — CBC WITH DIFFERENTIAL/PLATELET
Basophils Absolute: 0.1 10*3/uL (ref 0.0–0.1)
Basophils Relative: 1 % (ref 0.0–3.0)
Eosinophils Absolute: 0.3 10*3/uL (ref 0.0–0.7)
Eosinophils Relative: 2.2 % (ref 0.0–5.0)
HCT: 39.7 % (ref 36.0–46.0)
Hemoglobin: 12.9 g/dL (ref 12.0–15.0)
Lymphocytes Relative: 33.8 % (ref 12.0–46.0)
Lymphs Abs: 4.7 10*3/uL — ABNORMAL HIGH (ref 0.7–4.0)
MCHC: 32.5 g/dL (ref 30.0–36.0)
MCV: 87.6 fl (ref 78.0–100.0)
Monocytes Absolute: 1 10*3/uL (ref 0.1–1.0)
Monocytes Relative: 7.2 % (ref 3.0–12.0)
Neutro Abs: 7.8 10*3/uL — ABNORMAL HIGH (ref 1.4–7.7)
Neutrophils Relative %: 55.8 % (ref 43.0–77.0)
Platelets: 289 10*3/uL (ref 150.0–400.0)
RBC: 4.54 Mil/uL (ref 3.87–5.11)
RDW: 15.8 % — ABNORMAL HIGH (ref 11.5–15.5)
WBC: 13.9 10*3/uL — ABNORMAL HIGH (ref 4.0–10.5)

## 2021-11-08 NOTE — Progress Notes (Signed)
Subjective:  Patient ID: Ashley Price, female    DOB: Jan 26, 1956  Age: 66 y.o. MRN: [id number]  CC: Anemia and Diabetes   HPI Ashley Price presents for establishing.  She is with her daughter today.  She walks every day and does not experience chest pain, shortness of breath, or diaphoresis.  She complains of chronic fatigue and knee pain.  She is being seen by an orthopedist.  She has had a decline in memory after a CVA.  Today she refused an influenza vaccine, pneumonia vaccine, shingles vaccine, and tetanus vaccine.  History Ashley Price has a past medical history of Hypertension, Osteoarthritis, and Stroke ([place]).   She has a past surgical history that includes Tubal ligation (Bilateral); Amputation (Left, 08/18/2018); and Percutaneous venous thrombectomy,lysis with intravascular ultrasound (ivus) (Left, 07/29/2021).   Her family history includes Alcoholism in her brother; Breast cancer in her mother; Heart failure in her brother; Kidney failure in her brother.She reports that she quit smoking about 2 years ago. Her smoking use included cigarettes. She has never been exposed to tobacco smoke. She has never used smokeless tobacco. She reports that she does not drink alcohol and does not use drugs.  Outpatient Medications Prior to Visit  Medication Sig Dispense Refill   aspirin EC 81 MG EC tablet Take 1 tablet (81 mg total) by mouth daily. 30 tablet 0   atorvastatin (LIPITOR) 40 MG tablet Take 1 tablet (40 mg total) by mouth daily at 6 PM. 30 tablet 0   lisinopril (ZESTRIL) 10 MG tablet Take 10 mg by mouth daily.     oxyCODONE-acetaminophen (PERCOCET) 5-325 MG tablet Take 1 tablet by mouth every 6 (six) hours as needed for severe pain. 12 tablet 0   rivaroxaban (XARELTO) 20 MG TABS tablet Take 1 tablet (20 mg total) by mouth daily with supper. 30 tablet 5   metFORMIN (GLUCOPHAGE) 500 MG tablet Take 1 tablet (500 mg total) by mouth daily with breakfast. 30 tablet 0   No facility-administered  medications prior to visit.    ROS Review of Systems  Constitutional:  Positive for fatigue. Negative for appetite change, chills, diaphoresis, fever and unexpected weight change.  HENT: Negative.    Eyes: Negative.   Respiratory:  Negative for cough, chest tightness, shortness of breath and wheezing.   Cardiovascular:  Negative for chest pain, palpitations and leg swelling.  Gastrointestinal:  Negative for abdominal pain, constipation, diarrhea, nausea and vomiting.  Endocrine: Negative.   Genitourinary: Negative.  Negative for difficulty urinating.  Musculoskeletal:  Positive for arthralgias. Negative for myalgias.  Skin: Negative.   Neurological: Negative.  Negative for dizziness, weakness, light-headedness and headaches.  Hematological:  Negative for adenopathy. Does not bruise/bleed easily.  Psychiatric/Behavioral: Negative.      Objective:  BP 132/76 (BP Location: Left Arm, Patient Position: Sitting, Cuff Size: Large)   Pulse 78   Temp 98.3 F (36.8 C) (Oral)   Ht '5\' 3"'$  (1.6 m)   Wt 163 lb (73.9 kg)   SpO2 96%   BMI 28.87 kg/m   Physical Exam Vitals reviewed.  HENT:     Mouth/Throat:     Mouth: Mucous membranes are moist.  Eyes:     General: No scleral icterus.    Conjunctiva/sclera: Conjunctivae normal.  Cardiovascular:     Rate and Rhythm: Normal rate and regular rhythm.     Heart sounds: No murmur heard. Pulmonary:     Effort: Pulmonary effort is normal.     Breath sounds: No  stridor. No wheezing, rhonchi or rales.  Abdominal:     General: Abdomen is flat.     Palpations: There is no mass.     Tenderness: There is no abdominal tenderness. There is no guarding.     Hernia: No hernia is present.  Musculoskeletal:        General: Normal range of motion.     Cervical back: Neck supple.     Right lower leg: No edema.     Left lower leg: No edema.  Lymphadenopathy:     Cervical: No cervical adenopathy.  Skin:    General: Skin is warm and dry.   Neurological:     General: No focal deficit present.     Mental Status: She is alert.  Psychiatric:        Mood and Affect: Mood normal.        Behavior: Behavior normal.     Lab Results  Component Value Date   WBC 13.9 (H) 11/08/2021   HGB 12.9 11/08/2021   HCT 39.7 11/08/2021   PLT 289.0 11/08/2021   GLUCOSE 136 (H) 07/30/2021   CHOL 73 07/30/2021   TRIG 40 07/30/2021   HDL 33 (L) 07/30/2021   LDLCALC 32 07/30/2021   ALT 13 07/30/2021   AST 17 07/30/2021   NA 136 07/30/2021   K 3.5 07/30/2021   CL 106 07/30/2021   CREATININE 0.94 07/30/2021   BUN 12 07/30/2021   CO2 21 (L) 07/30/2021   TSH 2.510 10/06/2021   INR 1.2 07/29/2021   HGBA1C 6.0 (H) 10/06/2021   MICROALBUR 2.7 (H) 11/08/2021     Assessment & Plan:   Ashley Price was seen today for anemia and diabetes.  Diagnoses and all orders for this visit:  Deficiency anemia- Her H&H are normal now. -     IBC + Ferritin; Future -     Vitamin B12; Future -     Folate; Future -     Vitamin B1; Future -     Zinc; Future -     CBC with Differential/Platelet; Future -     Reticulocytes; Future -     Reticulocytes -     CBC with Differential/Platelet -     Zinc -     Vitamin B1 -     Folate -     Vitamin B12 -     IBC + Ferritin  Type 2 diabetes mellitus with diabetic peripheral angiopathy without gangrene, without long-term current use of insulin ([place])- Her blood sugar is very well controlled. -     Microalbumin / creatinine urine ratio; Future -     Urinalysis, Routine w reflex microscopic; Future -     HM Diabetes Foot Exam -     Ambulatory referral to Ophthalmology -     Urinalysis, Routine w reflex microscopic -     Microalbumin / creatinine urine ratio  Continuous tobacco abuse -     Ambulatory Referral for Lung Cancer Scre  Essential hypertension- Her blood pressure is well controlled. -     Microalbumin / creatinine urine ratio; Future -     Urinalysis, Routine w reflex microscopic; Future -      Urinalysis, Routine w reflex microscopic -     Microalbumin / creatinine urine ratio   I am having Ashley Price. Ashley Price maintain her aspirin EC, atorvastatin, metFORMIN, lisinopril, rivaroxaban, and oxyCODONE-acetaminophen.  No orders of the defined types were placed in this encounter.    Follow-up: Return in about  3 months (around 02/08/2022).  Scarlette Calico, MD

## 2021-11-08 NOTE — Patient Instructions (Signed)

## 2021-11-09 LAB — IBC + FERRITIN
Ferritin: 83.6 ng/mL (ref 10.0–291.0)
Iron: 59 ug/dL (ref 42–145)
Saturation Ratios: 23.3 % (ref 20.0–50.0)
TIBC: 253.4 ug/dL (ref 250.0–450.0)
Transferrin: 181 mg/dL — ABNORMAL LOW (ref 212.0–360.0)

## 2021-11-09 LAB — URINALYSIS, ROUTINE W REFLEX MICROSCOPIC
Bilirubin Urine: NEGATIVE
Ketones, ur: NEGATIVE
Nitrite: NEGATIVE
RBC / HPF: NONE SEEN (ref 0–?)
Specific Gravity, Urine: 1.015 (ref 1.000–1.030)
Total Protein, Urine: NEGATIVE
Urine Glucose: NEGATIVE
Urobilinogen, UA: 0.2 (ref 0.0–1.0)
pH: 6 (ref 5.0–8.0)

## 2021-11-09 LAB — VITAMIN B12: Vitamin B-12: 347 pg/mL (ref 211–911)

## 2021-11-09 LAB — MICROALBUMIN / CREATININE URINE RATIO
Creatinine,U: 85.1 mg/dL
Microalb Creat Ratio: 3.2 mg/g (ref 0.0–30.0)
Microalb, Ur: 2.7 mg/dL — ABNORMAL HIGH (ref 0.0–1.9)

## 2021-11-09 LAB — FOLATE: Folate: 10.3 ng/mL (ref >5.9)

## 2021-11-12 ENCOUNTER — Encounter: Payer: Self-pay | Admitting: Internal Medicine

## 2021-11-12 LAB — VITAMIN B1: Vitamin B1 (Thiamine): 9 nmol/L (ref 8–30)

## 2021-11-12 LAB — RETICULOCYTES
ABS Retic: 53760 cells/uL (ref 20000–80000)
Retic Ct Pct: 1.2 %

## 2021-11-12 LAB — ZINC: Zinc: 65 ug/dL (ref 60–130)

## 2021-11-24 ENCOUNTER — Other Ambulatory Visit: Payer: Self-pay | Admitting: Internal Medicine

## 2021-11-24 DIAGNOSIS — I10 Essential (primary) hypertension: Secondary | ICD-10-CM

## 2021-11-24 DIAGNOSIS — E1151 Type 2 diabetes mellitus with diabetic peripheral angiopathy without gangrene: Secondary | ICD-10-CM

## 2021-11-24 MED ORDER — LISINOPRIL 10 MG PO TABS: 10.0000 mg | ORAL_TABLET | Freq: Every day | ORAL | 1 refills | Status: DC

## 2021-11-25 ENCOUNTER — Other Ambulatory Visit: Payer: Self-pay | Admitting: *Deleted

## 2021-11-25 DIAGNOSIS — F17210 Nicotine dependence, cigarettes, uncomplicated: Secondary | ICD-10-CM

## 2021-11-25 DIAGNOSIS — Z87891 Personal history of nicotine dependence: Secondary | ICD-10-CM

## 2021-11-25 DIAGNOSIS — Z122 Encounter for screening for malignant neoplasm of respiratory organs: Secondary | ICD-10-CM

## 2021-12-07 ENCOUNTER — Encounter: Payer: Self-pay | Admitting: Internal Medicine

## 2021-12-08 ENCOUNTER — Other Ambulatory Visit: Payer: Self-pay | Admitting: Internal Medicine

## 2021-12-08 DIAGNOSIS — I10 Essential (primary) hypertension: Secondary | ICD-10-CM

## 2021-12-08 MED ORDER — NEBIVOLOL HCL 5 MG PO TABS: 5.0000 mg | ORAL_TABLET | Freq: Every day | ORAL | 0 refills | Status: DC

## 2021-12-12 ENCOUNTER — Other Ambulatory Visit: Payer: Self-pay | Admitting: Internal Medicine

## 2021-12-12 DIAGNOSIS — I10 Essential (primary) hypertension: Secondary | ICD-10-CM

## 2021-12-12 MED ORDER — SPIRONOLACTONE 25 MG PO TABS: 25.0000 mg | ORAL_TABLET | Freq: Every day | ORAL | 0 refills | Status: DC

## 2021-12-12 NOTE — Progress Notes (Unsigned)
Lab Results  Component Value Date   WBC 13.9 (H) 11/08/2021   HGB 12.9 11/08/2021   HCT 39.7 11/08/2021   PLT 289.0 11/08/2021   GLUCOSE 136 (H) 07/30/2021   CHOL 73 07/30/2021   TRIG 40 07/30/2021   HDL 33 (L) 07/30/2021   LDLCALC 32 07/30/2021   ALT 13 07/30/2021   AST 17 07/30/2021   NA 136 07/30/2021   K 3.5 07/30/2021   CL 106 07/30/2021   CREATININE 0.94 07/30/2021   BUN 12 07/30/2021   CO2 21 (L) 07/30/2021   TSH 2.510 10/06/2021   INR 1.2 07/29/2021   HGBA1C 6.0 (H) 10/06/2021   MICROALBUR 2.7 (H) 11/08/2021

## 2021-12-22 ENCOUNTER — Emergency Department (HOSPITAL_COMMUNITY): Payer: Medicare Other

## 2021-12-22 ENCOUNTER — Emergency Department (HOSPITAL_COMMUNITY)
Admission: EM | Admit: 2021-12-22 | Discharge: 2021-12-22 | Disposition: A | Discharge status: Home / Self Care | Payer: Medicare Other | Attending: Emergency Medicine | Admitting: Emergency Medicine

## 2021-12-22 ENCOUNTER — Other Ambulatory Visit: Payer: Self-pay

## 2021-12-22 DIAGNOSIS — Z7984 Long term (current) use of oral hypoglycemic drugs: Secondary | ICD-10-CM | POA: Insufficient documentation

## 2021-12-22 DIAGNOSIS — Z7982 Long term (current) use of aspirin: Secondary | ICD-10-CM | POA: Insufficient documentation

## 2021-12-22 DIAGNOSIS — S0990XA Unspecified injury of head, initial encounter: Secondary | ICD-10-CM | POA: Diagnosis not present

## 2021-12-22 DIAGNOSIS — W0110XA Fall on same level from slipping, tripping and stumbling with subsequent striking against unspecified object, initial encounter: Secondary | ICD-10-CM | POA: Diagnosis not present

## 2021-12-22 DIAGNOSIS — Z7901 Long term (current) use of anticoagulants: Secondary | ICD-10-CM | POA: Diagnosis not present

## 2021-12-22 DIAGNOSIS — I6381 Other cerebral infarction due to occlusion or stenosis of small artery: Secondary | ICD-10-CM | POA: Diagnosis not present

## 2021-12-22 NOTE — Progress Notes (Signed)
   12/22/21 1630  Clinical Encounter Type  Visited With Patient;Family  Visit Type Initial;Trauma  Referral From Nurse  Consult/Referral To Chaplain   Chaplain responded to a level two trauma. The patient, Ashley Price is in good frame of mind in spite of her fall. The medical team will be taking her to CT shortly. Betty's daughter arrived while we were talking. They had enjoyed their holiday meal already and ready to settle down. Ashley Price hopes she is able to return home after being checked out.  If a chaplain is requested later in the patient's stay someone will respond.   Danice Goltz [facility]  [phone]

## 2021-12-22 NOTE — ED Triage Notes (Signed)
Pt reports trip and fall about an hour ago striking back of her head.  No LOC.  Does take xarelto.

## 2021-12-22 NOTE — ED Provider Notes (Signed)
[facility] EMERGENCY DEPARTMENT Provider Note   CSN: [id number] Arrival date & time: 12/22/21  1617     History  No chief complaint on file.   Ashley Price is a 66 y.o. female.  66 year old female who presents after mechanical fall just prior to arrival where she struck her head.  No loss of consciousness.  She does take Xarelto.  Denies any headache or visual changes.  No focal weakness.  No other complaints at this time.  Has not had any emesis.       Home Medications Prior to Admission medications   Medication Sig Start Date End Date Taking? Authorizing Provider  nebivolol (BYSTOLIC) 5 MG tablet Take 1 tablet (5 mg total) by mouth daily. 12/08/21   Janith Lima, MD  aspirin EC 81 MG EC tablet Take 1 tablet (81 mg total) by mouth daily. 12/24/18   Hongalgi, Lenis Dickinson, MD  atorvastatin (LIPITOR) 40 MG tablet Take 1 tablet (40 mg total) by mouth daily at 6 PM. 12/23/18   Hongalgi, Lenis Dickinson, MD  lisinopril (ZESTRIL) 10 MG tablet Take 1 tablet (10 mg total) by mouth daily. 11/24/21   Janith Lima, MD  metFORMIN (GLUCOPHAGE) 500 MG tablet Take 1 tablet (500 mg total) by mouth daily with breakfast. 12/23/18 10/08/21  Hongalgi, Lenis Dickinson, MD  oxyCODONE-acetaminophen (PERCOCET) 5-325 MG tablet Take 1 tablet by mouth every 6 (six) hours as needed for severe pain. 07/31/21 07/31/22  Baglia, Corrina, PA-C  rivaroxaban (XARELTO) 20 MG TABS tablet Take 1 tablet (20 mg total) by mouth daily with supper. 08/20/21   Baglia, Corrina, PA-C  spironolactone (ALDACTONE) 25 MG tablet Take 1 tablet (25 mg total) by mouth daily. 12/12/21   Janith Lima, MD      Allergies    Patient has no known allergies.    Review of Systems   Review of Systems  All other systems reviewed and are negative.   Physical Exam Updated Vital Signs BP (!) 182/80 (BP Location: Left Arm)   Pulse 60   Temp 98.6 F (37 C) (Oral)   Resp 16   SpO2 97%  Physical Exam Vitals and nursing note  reviewed.  Constitutional:      General: She is not in acute distress.    Appearance: Normal appearance. She is well-developed. She is not toxic-appearing.  HENT:     Head: Normocephalic and atraumatic.  Eyes:     General: Lids are normal.     Conjunctiva/sclera: Conjunctivae normal.     Pupils: Pupils are equal, round, and reactive to light.  Neck:     Thyroid: No thyroid mass.     Trachea: No tracheal deviation.  Cardiovascular:     Rate and Rhythm: Normal rate and regular rhythm.     Heart sounds: Normal heart sounds. No murmur heard.    No gallop.  Pulmonary:     Effort: Pulmonary effort is normal. No respiratory distress.     Breath sounds: Normal breath sounds. No stridor. No decreased breath sounds, wheezing, rhonchi or rales.  Abdominal:     General: There is no distension.     Palpations: Abdomen is soft.     Tenderness: There is no abdominal tenderness. There is no rebound.  Musculoskeletal:        General: No tenderness. Normal range of motion.     Cervical back: Normal range of motion and neck supple.  Skin:    General: Skin is warm and dry.  Findings: No abrasion or rash.  Neurological:     Mental Status: She is alert and oriented to person, place, and time. Mental status is at baseline.     GCS: GCS eye subscore is 4. GCS verbal subscore is 5. GCS motor subscore is 6.     Cranial Nerves: No cranial nerve deficit.     Sensory: No sensory deficit.     Motor: Motor function is intact.  Psychiatric:        Attention and Perception: Attention normal.        Speech: Speech normal.        Behavior: Behavior normal.     ED Results / Procedures / Treatments   Labs (all labs ordered are listed, but only abnormal results are displayed) Labs Reviewed  SAMPLE TO BLOOD BANK    EKG None  Radiology No results found.  Procedures Procedures    Medications Ordered in ED Medications - No data to display  ED Course/ Medical Decision Making/ A&P                            Medical Decision Making Amount and/or Complexity of Data Reviewed Radiology: ordered.   Patient without physical signs of trauma on physical exam here.  Due to patient being on Xarelto and had mechanical head injury she did have a head CT which per my review interpretation did not show any acute findings.  She is asymptomatic at this time.  Her neurological exam is normal.  Will discharge home        Final Clinical Impression(s) / ED Diagnoses Final diagnoses:  None    Rx / DC Orders ED Discharge Orders     None         Lacretia Leigh, MD 12/22/21 1723

## 2021-12-22 NOTE — Progress Notes (Signed)
Orthopedic Tech Progress Note Patient Details:  Ashley Price 11-28-1955 [id number]  Level 2 trauma  Patient ID: Ander Purpura, female   DOB: 12-07-1955, 66 y.o.   MRN: [id number]  Carin Primrose 12/22/2021, 4:52 PM

## 2022-01-01 ENCOUNTER — Other Ambulatory Visit: Payer: Self-pay | Admitting: Internal Medicine

## 2022-01-01 ENCOUNTER — Encounter: Payer: Self-pay | Admitting: Internal Medicine

## 2022-01-01 DIAGNOSIS — I10 Essential (primary) hypertension: Secondary | ICD-10-CM

## 2022-01-02 ENCOUNTER — Other Ambulatory Visit: Payer: Self-pay | Admitting: Internal Medicine

## 2022-01-02 DIAGNOSIS — I739 Peripheral vascular disease, unspecified: Secondary | ICD-10-CM

## 2022-01-02 DIAGNOSIS — I10 Essential (primary) hypertension: Secondary | ICD-10-CM

## 2022-01-02 DIAGNOSIS — E1151 Type 2 diabetes mellitus with diabetic peripheral angiopathy without gangrene: Secondary | ICD-10-CM

## 2022-01-02 MED ORDER — LISINOPRIL 20 MG PO TABS: 20.0000 mg | ORAL_TABLET | Freq: Every day | ORAL | 0 refills | Status: DC

## 2022-01-04 ENCOUNTER — Ambulatory Visit (HOSPITAL_COMMUNITY)
Admission: RE | Admit: 2022-01-04 | Discharge: 2022-01-04 | Disposition: A | Discharge status: Home / Self Care | Payer: Medicare Other | Source: Ambulatory Visit | Attending: Acute Care | Admitting: Acute Care

## 2022-01-04 ENCOUNTER — Encounter (HOSPITAL_COMMUNITY): Payer: Self-pay

## 2022-01-04 ENCOUNTER — Ambulatory Visit (INDEPENDENT_AMBULATORY_CARE_PROVIDER_SITE_OTHER): Payer: Medicare Other | Admitting: Acute Care

## 2022-01-04 ENCOUNTER — Encounter: Payer: Self-pay | Admitting: Acute Care

## 2022-01-04 DIAGNOSIS — Z87891 Personal history of nicotine dependence: Secondary | ICD-10-CM | POA: Insufficient documentation

## 2022-01-04 DIAGNOSIS — Z122 Encounter for screening for malignant neoplasm of respiratory organs: Secondary | ICD-10-CM | POA: Diagnosis not present

## 2022-01-04 DIAGNOSIS — F17210 Nicotine dependence, cigarettes, uncomplicated: Secondary | ICD-10-CM | POA: Insufficient documentation

## 2022-01-04 NOTE — Patient Instructions (Signed)
Thank you for participating in the [facility]. It was our pleasure to meet you today. We will call you with the results of your scan within the next few days. Your scan will be assigned a Lung RADS category score by the physicians reading the scans.  This Lung RADS score determines follow up scanning.  See below for description of categories, and follow up screening recommendations. We will be in touch to schedule your follow up screening annually or based on recommendations of our providers. We will fax a copy of your scan results to your Primary Care Physician, or the physician who referred you to the program, to ensure they have the results. Please call the office if you have any questions or concerns regarding your scanning experience or results.  Our office number is [phone]. Please speak with Denise Phelps, RN. , or  Denise Buckner RN, They are  our Lung Cancer Screening RN.'s If They are unavailable when you call, Please leave a message on the voice mail. We will return your call at our earliest convenience.This voice mail is monitored several times a day.  Remember, if your scan is normal, we will scan you annually as long as you continue to meet the criteria for the program. (Age 55-77, Current smoker or smoker who has quit within the last 15 years). If you are a smoker, remember, quitting is the single most powerful action that you can take to decrease your risk of lung cancer and other pulmonary, breathing related problems. We know quitting is hard, and we are here to help.  Please let us know if there is anything we can do to help you meet your goal of quitting. If you are a former smoker, congratulations. We are proud of you! Remain smoke free! Remember you can refer friends or family members through the number above.  We will screen them to make sure they meet criteria for the program. Thank you for helping us take better care of you by  participating in Lung Screening.  You can receive free nicotine replacement therapy ( patches, gum or mints) by calling 1-800-QUIT NOW. Please call so we can get you on the path to becoming  a non-smoker. I know it is hard, but you can do this!  Lung RADS Categories:  Lung RADS 1: no nodules or definitely non-concerning nodules.  Recommendation is for a repeat annual scan in 12 months.  Lung RADS 2:  nodules that are non-concerning in appearance and behavior with a very low likelihood of becoming an active cancer. Recommendation is for a repeat annual scan in 12 months.  Lung RADS 3: nodules that are probably non-concerning , includes nodules with a low likelihood of becoming an active cancer.  Recommendation is for a 6-month repeat screening scan. Often noted after an upper respiratory illness. We will be in touch to make sure you have no questions, and to schedule your 6-month scan.  Lung RADS 4 A: nodules with concerning findings, recommendation is most often for a follow up scan in 3 months or additional testing based on our provider's assessment of the scan. We will be in touch to make sure you have no questions and to schedule the recommended 3 month follow up scan.  Lung RADS 4 B:  indicates findings that are concerning. We will be in touch with you to schedule additional diagnostic testing based on our provider's  assessment of the scan.  Other options for assistance in smoking cessation (   As covered by your insurance benefits)  Hypnosis for smoking cessation  [employer] [phone]  Acupuncture for smoking cessation  [facility] [phone]   

## 2022-01-04 NOTE — Progress Notes (Signed)
Virtual Visit via Telephone Note  I connected with Ander Purpura on 01/04/22 at 11:30 AM EST by telephone and verified that I am speaking with the correct person using two identifiers.  Location: Patient:  At home Provider:  [address], [place], [place], [address]    I discussed the limitations, risks, security and privacy concerns of performing an evaluation and management service by telephone and the availability of in person appointments. I also discussed with the patient that there may be a patient responsible charge related to this service. The patient expressed understanding and agreed to proceed.   Shared Decision Making Visit Lung Cancer Screening Program [phone])   Eligibility: Age 66 y.o. Pack Years Smoking History Calculation 30 pack year smoking history (# packs/per year x # years smoked) Recent History of coughing up blood  no Unexplained weight loss? no ( >Than 15 pounds within the last 6 months ) Prior History Lung / other cancer no (Diagnosis within the last 5 years already requiring surveillance chest CT Scans). Smoking Status Current Smoker Former Smokers: Years since quit:  NA  Quit Date:  NA  Visit Components: Discussion included one or more decision making aids. yes Discussion included risk/benefits of screening. yes Discussion included potential follow up diagnostic testing for abnormal scans. yes Discussion included meaning and risk of over diagnosis. yes Discussion included meaning and risk of False Positives. yes Discussion included meaning of total radiation exposure. yes  Counseling Included: Importance of adherence to annual lung cancer LDCT screening. yes Impact of comorbidities on ability to participate in the program. yes Ability and willingness to under diagnostic treatment. yes  Smoking Cessation Counseling: Current Smokers:  Discussed importance of smoking cessation. yes Information about tobacco cessation classes and  interventions provided to patient. yes Patient provided with "ticket" for LDCT Scan. yes Symptomatic Patient. no  Counseling NA Diagnosis Code: Tobacco Use Z72.0 Asymptomatic Patient yes  Counseling (Intermediate counseling: > three minutes counseling) Z0258 Former Smokers:  Discussed the importance of maintaining cigarette abstinence. yes Diagnosis Code: Personal History of Nicotine Dependence. N27.782 Information about tobacco cessation classes and interventions provided to patient. Yes Patient provided with "ticket" for LDCT Scan. yes Written Order for Lung Cancer Screening with LDCT placed in Epic. Yes (CT Chest Lung Cancer Screening Low Dose W/O CM) [id number] Z12.2-Screening of respiratory organs Z87.891-Personal history of nicotine dependence  I have spent 25 minutes of face to face/ virtual visit   time with  Ms. Kleinman and her daughter Caryl Pina discussing the risks and benefits of lung cancer screening. We viewed / discussed a power point together that explained in detail the above noted topics. We paused at intervals to allow for questions to be asked and answered to ensure understanding.We discussed that the single most powerful action that she  can take to decrease her risk of developing lung cancer is to quit smoking. We discussed whether or not she is ready to commit to setting a quit date. We discussed options for tools to aid in quitting smoking including nicotine replacement therapy, non-nicotine medications, support groups, Quit Smart classes, and behavior modification. We discussed that often times setting smaller, more achievable goals, such as eliminating 1 cigarette a day for a week and then 2 cigarettes a day for a week can be helpful in slowly decreasing the number of cigarettes smoked. This allows for a sense of accomplishment as well as providing a clinical benefit. I provided  her  with smoking cessation  information  with contact  information for community resources, classes,  free nicotine replacement therapy, and access to mobile apps, text messaging, and on-line smoking cessation help. I have also provided  her  the office contact information in the event she needs to contact me, or the screening staff. We discussed the time and location of the scan, and that either Doroteo Glassman RN, Joella Prince, RN  or I will call / send a letter with the results within 24-72 hours of receiving them. The patient verbalized understanding of all of  the above and had no further questions upon leaving the office. They have my contact information in the event they have any further questions.  I spent 3 minutes counseling on smoking cessation and the health risks of continued tobacco abuse.  I explained to the patient that there has been a high incidence of coronary artery disease noted on these exams. I explained that this is a non-gated exam therefore degree or severity cannot be determined. This patient is on statin therapy. I have asked the patient to follow-up with their PCP regarding any incidental finding of coronary artery disease and management with diet or medication as their PCP  feels is clinically indicated. The patient verbalized understanding of the above and had no further questions upon completion of the visit.      Magdalen Spatz, NP 01/04/2022

## 2022-01-06 ENCOUNTER — Other Ambulatory Visit: Payer: Self-pay

## 2022-01-06 DIAGNOSIS — F17210 Nicotine dependence, cigarettes, uncomplicated: Secondary | ICD-10-CM

## 2022-01-06 DIAGNOSIS — Z122 Encounter for screening for malignant neoplasm of respiratory organs: Secondary | ICD-10-CM

## 2022-01-06 DIAGNOSIS — Z87891 Personal history of nicotine dependence: Secondary | ICD-10-CM

## 2022-02-07 ENCOUNTER — Encounter: Payer: Self-pay | Admitting: Neurology

## 2022-02-07 ENCOUNTER — Ambulatory Visit: Payer: Medicare HMO | Admitting: Neurology

## 2022-02-07 VITALS — BP 155/86 | HR 75 | Ht 63.5 in | Wt 165.0 lb

## 2022-02-07 DIAGNOSIS — R413 Other amnesia: Secondary | ICD-10-CM | POA: Diagnosis not present

## 2022-02-07 NOTE — Patient Instructions (Signed)
I had a long discussion with the patient and her daughter regarding her symptoms of memory and cognitive worsening which likely represent mild cognitive impairment likely as a late effect of her multiple previous lacunar strokes.  I encouraged her to increase participation in cognitively challenging activities like solving crossword puzzles, playing bridge and sudoku.  We also discussed memory compensation strategies.   Continue aspirin for stroke prevention and Xarelto for her DVT and maintain aggressive risk factor modification with strict control of hypertension with blood pressure goal below 130/90, lipids with LDL cholesterol goal below 70 mg percent and diabetes with hemoglobin A1c goal below 6.5%.  She will return for follow-up in the future in 6 months with my nurse practitioner or call earlier if necessary.   Memory Compensation Strategies  Use "WARM" strategy.  W= write it down  A= associate it  R= repeat it  M= make a mental note  2.   You can keep a Social worker.  Use a 3-ring notebook with sections for the following: calendar, important names and phone numbers,  medications, doctors' names/phone numbers, lists/reminders, and a section to journal what you did  each day.   3.    Use a calendar to write appointments down.  4.    Write yourself a schedule for the day.  This can be placed on the calendar or in a separate section of the Memory Notebook.  Keeping a  regular schedule can help memory.  5.    Use medication organizer with sections for each day or morning/evening pills.  You may need help loading it  6.    Keep a basket, or pegboard by the door.  Place items that you need to take out with you in the basket or on the pegboard.  You may also want to  include a message board for reminders.  7.    Use sticky notes.  Place sticky notes with reminders in a place where the task is performed.  For example: " turn off the  stove" placed by the stove, "lock the door" placed on  the door at eye level, " take your medications" on  the bathroom mirror or by the place where you normally take your medications.  8.    Use alarms/timers.  Use while cooking to remind yourself to check on food or as a reminder to take your medicine, or as a  reminder to make a call, or as a reminder to perform another task, etc.

## 2022-02-07 NOTE — Progress Notes (Signed)
[facility] [address] [place]. [place] [address] [phone]       OFFICE FOLLOW-UP VISIT NOTE  [name] Date of Birth:  [birth year] Medical Record Number:  [id number]   Referring MD:   Matilde Haymaker  Reason for Referral: Memory loss  HPI: Initial visit 10/06/2021 Ashley Price is a 67 year old pleasant African-American lady seen today for office consultation visit for memory loss.  She is accompanied by her daughter.  History is obtained from them and review of referral notes and electronic medical records and opossum reviewed pertinent available imaging films in PACS.  She has past medical history of diabetes, pretension, hyperlipidemia, osteoarthritis and right medullary infarct in November 2020.  The patient has developed some memory and cognitive difficulties since December of last year.  She has had intermittent confusion and disorientation off-and-on.  She is challenged with learning any new technology.  For example she tried to order a meal through door [employer] app and could not figure out why it was not working.  Short-term memory is quite poor and she often needs information to be repeated multiple times.  She has been under increased stress for the last 1 year as she has lost her daughter, uncle and husband and may be in significant grief.  The daughter reports an episode in June this year when she was found to be staring into space with a glazed look on her face.  This was transient and she recovered quickly.  She saw her primary care physician who actually referred her to the ER where actually she was found to have a DVT in the legs and started on Xarelto which she is still taking.  She was started on Aricept but she has not been consistently taking it as per the daughter.  She was last seen in the office on 05/21/2019 by Janett Billow nurse practitioner.  Since then she had a CT scan of the head on 07/29/2021 which shows mild generalized atrophy and changes of small  vessel disease.  Old bilateral basal ganglia lacunar infarcts are noted.  No acute abnormalities noted.  She had echocardiogram in 07/30/2021 which showed ejection fraction of 60 to 65%.  LDL cholesterol on 07/30/2021 was 32 mg percent.  She remains on aspirin 81 mg which is tolerating well without bleeding or bruising.  She is also tolerating Lipitor 40 mg well without any side effects.  She remains on Glucophage for diabetes which appears well controlled.  She is now on Balto and is having minor bruising but no bleeding episodes.  On Mini-Mental status exam today she scored 28/30 with only deficits in recall.  On geriatric depression scale she was not depressed with a score of only 1. Update 02/07/2022 : She returns for follow-up after last visit 4 months ago.  She is accompanied by her daughter.  Patient states that she is doing well.  Continues to have short-term memory difficulties which are unchanged.  He has poor short-term memory and often repeats herself and asked the same question she has not had any stroke or TIA symptoms.  She had lab work done at last visit vitamin B12, TSH, RPR and homocystine all normal.  Hemoglobin A1c was 6.0.  MRI scan of the brain on 10/27/2021 showed no acute abnormalities but old bilateral lacunar infarcts involving basal ganglia, corona radiata and cerebellum.  MR angiogram of the brain and neck both did not show large vessel stenosis or occlusion.  She tripped and had a fall  and was seen in the ER on 12/23/2018.  CT scan of the head was obtained which showed no acute abnormalities.  She remains on aspirin 81 mg daily as well as Xarelto tolerating well with minor bruising and no bleeding. ROS:   14 system review of systems is positive for memory loss, confusion, disorientation and all other systems negative  PMH:  Past Medical History:  Diagnosis Date   Hypertension    Osteoarthritis    Stroke [facility])     Social History:  Social History   Socioeconomic History   Marital  status: Single    Spouse name: Not on file   Number of children: Not on file   Years of education: Not on file   Highest education level: Not on file  Occupational History   Not on file  Tobacco Use   Smoking status: Former    Packs/day: 0.75    Years: 40.00    Total pack years: 30.00    Types: Cigarettes    Quit date: 12/30/2018    Years since quitting: 3.1    Passive exposure: Never   Smokeless tobacco: Never  Vaping Use   Vaping Use: Never used  Substance and Sexual Activity   Alcohol use: No   Drug use: No   Sexual activity: Not on file  Other Topics Concern   Not on file  Social History Narrative   Lives with her children   Social Determinants of Health   Financial Resource Strain: Not on file  Food Insecurity: Not on file  Transportation Needs: Not on file  Physical Activity: Not on file  Stress: Not on file  Social Connections: Not on file  Intimate Partner Violence: Not on file    Medications:   Current Outpatient Medications on File Prior to Visit  Medication Sig Dispense Refill   aspirin EC 81 MG EC tablet Take 1 tablet (81 mg total) by mouth daily. 30 tablet 0   atorvastatin (LIPITOR) 40 MG tablet Take 1 tablet (40 mg total) by mouth daily at 6 PM. 30 tablet 0   lisinopril (ZESTRIL) 20 MG tablet Take 1 tablet (20 mg total) by mouth daily. 90 tablet 0   nebivolol (BYSTOLIC) 5 MG tablet TAKE 1 TABLET (5 MG TOTAL) BY MOUTH DAILY. 90 tablet 0   oxyCODONE-acetaminophen (PERCOCET) 5-325 MG tablet Take 1 tablet by mouth every 6 (six) hours as needed for severe pain. 12 tablet 0   rivaroxaban (XARELTO) 20 MG TABS tablet Take 1 tablet (20 mg total) by mouth daily with supper. 30 tablet 5   spironolactone (ALDACTONE) 25 MG tablet Take 1 tablet (25 mg total) by mouth daily. 90 tablet 0   metFORMIN (GLUCOPHAGE) 500 MG tablet Take 1 tablet (500 mg total) by mouth daily with breakfast. 30 tablet 0   No current facility-administered medications on file prior to visit.     Allergies:  No Known Allergies  Physical Exam General: well developed, well nourished, pleasant middle-aged African-American lady seated, in no evident distress Head: head normocephalic and atraumatic.   Neck: supple with no carotid or supraclavicular bruits Cardiovascular: regular rate and rhythm, no murmurs Musculoskeletal: no deformity Skin:  no rash/petichiae Vascular:  Normal pulses all extremities  Neurologic Exam Mental Status: Awake and fully alert. Oriented to place and time. Recent and remote memory intact. Attention span, concentration and fund of knowledge appropriate. Mood and affect appropriate.  Diminished recall 1/3.  Able to name only 7 animals which can walk on 4 legs.  Clock drawing 4/4.  Mini-Mental status exam score 28/30. Cranial Nerves: Fundoscopic exam reveals sharp disc margins. Pupils equal, briskly reactive to light. Extraocular movements full without nystagmus. Visual fields full to confrontation. Hearing intact. Facial sensation intact. Face, tongue, palate moves normally and symmetrically.  Motor: Normal bulk and tone. Normal strength in all tested extremity muscles. Sensory.: intact to touch , pinprick , position and vibratory sensation.  Coordination: Rapid alternating movements normal in all extremities. Finger-to-nose and heel-to-shin performed accurately bilaterally. Gait and Station: Arises from chair without difficulty. Stance is normal. Gait demonstrates normal stride length and balance . Able to heel, toe and tandem walk without difficulty.  Reflexes: 1+ and symmetric. Toes downgoing.   NIHSS  0 Modified Rankin  1    10/06/2021    8:17 AM  MMSE - Mini Mental State Exam  Orientation to time 5  Orientation to Place 5  Registration 3  Attention/ Calculation 5  Recall 1  Language- name 2 objects 2  Language- repeat 1  Language- follow 3 step command 3  Language- read & follow direction 1  Write a sentence 1  Copy design 1  Total score 28      ASSESSMENT: 67 year old African-American lady with 71-monthhistory of progressive memory and cognitive difficulties likely due to mild cognitive impairment.  Prior history of right lateral medullary infarct in November 2020 due to small vessel disease with multiple vascular risk factors of diabetes, hypertension, hyperlipidemia, smoking and cerebrovascular disease.     PLAN:II had a long discussion with the patient and her daughter regarding her symptoms of memory and cognitive worsening which likely represent mild cognitive impairment likely as a late effect of her multiple previous lacunar strokes.  I encouraged her to increase participation in cognitively challenging activities like solving crossword puzzles, playing bridge and sudoku.  We also discussed memory compensation strategies.   Continue aspirin for stroke prevention and Xarelto for her DVT and maintain aggressive risk factor modification with strict control of hypertension with blood pressure goal below 130/90, lipids with LDL cholesterol goal below 70 mg percent and diabetes with hemoglobin A1c goal below 6.5%.  She will return for follow-up in the future in 6 months with my nurse practitioner or call earlier if necessary.  Greater than 50% time during this 35-minute   visit was spent in counseling and coordination of care about her memory loss and cognitive impairment discussion about evaluation and treatment plan and answering questions.  PAntony Contras MD Note: This document was prepared with digital dictation and possible smart phrase technology. Any transcriptional errors that result from this process are unintentional.

## 2022-02-21 ENCOUNTER — Telehealth: Payer: Self-pay

## 2022-02-21 NOTE — Telephone Encounter (Signed)
Pt's daughter, Caryl Pina, called stating that the pt was out of refills for Xarelto.  Spoke with Corrie, [place], who advised that pt had the recommended 6 month course and since the pt would be seen on 2/5, no refill necessary.  Reviewed pt's chart, returned call for clarification, no answer, lf vm as indicated on DPR detailing advice.

## 2022-03-01 NOTE — Progress Notes (Unsigned)
Office Note     CC:  follow up Requesting Provider:  Janith Lima, MD  HPI: Ashley Price is a 67 y.o. (Dec 18, 1955) female who presents for follow up of extensive LLE DVT. She also has PAD.  She is s/p LLE angiogram, central venogram, pulmonary angiogram, and mechanical thrombectomy of IVC, left CIV, EIV, CFV, FV on 07/29/2021 by Dr. Stanford Breed. She received good technical results from mechanical thrombectomy with a large burden of thrombus cleared from the left iliac and femoral veins.  The patient's symptoms were completely resolved on POD 2 and she was discharged on Xarelto. At her last visit in August of 2023 she was doing very well. She was without any swelling, pain in her legs, rest pain, nonhealing wounds of the lower extremities. She was compliant with her Xarelto. Her last duplex study  revealed patent left femoral vein, popliteal vein, posterior tibial vein, and peroneal vein. Findings were consistent with chronic superficial vein thrombosis involving the left small saphenous vein. She was not having any symptoms relatable to her DVT or PAD.   Today she returns with non invasive studies. She reports ****   The pt is on a statin for cholesterol management.  The pt is on a daily aspirin.   Other AC:  Xarelto The pt is on BB,. ACE for hypertension.   The pt is diabetic.  Tobacco hx:  Former  Past Medical History:  Diagnosis Date   Hypertension    Osteoarthritis    Stroke [facility])     Past Surgical History:  Procedure Laterality Date   AMPUTATION Left 08/18/2018   Procedure: AMPUTATION LEFT 2ND TOE;  Surgeon: Tania Ade, MD;  Location: [place];  Service: Orthopedics;  Laterality: Left;   PERCUTANEOUS VENOUS THROMBECTOMY,LYSIS WITH INTRAVASCULAR ULTRASOUND (IVUS) Left 07/29/2021   Procedure: PERCUTANEOUS LEFT VENOUS THROMBECTOMY;  Surgeon: Cherre Robins, MD;  Location: [facility];  Service: Vascular;  Laterality: Left;   TUBAL LIGATION Bilateral     Social History    Socioeconomic History   Marital status: Single    Spouse name: Not on file   Number of children: Not on file   Years of education: Not on file   Highest education level: Not on file  Occupational History   Not on file  Tobacco Use   Smoking status: Former    Packs/day: 0.75    Years: 40.00    Total pack years: 30.00    Types: Cigarettes    Quit date: 12/30/2018    Years since quitting: 3.1    Passive exposure: Never   Smokeless tobacco: Never  Vaping Use   Vaping Use: Never used  Substance and Sexual Activity   Alcohol use: No   Drug use: No   Sexual activity: Not on file  Other Topics Concern   Not on file  Social History Narrative   Lives with her children   Social Determinants of Health   Financial Resource Strain: Not on file  Food Insecurity: Not on file  Transportation Needs: Not on file  Physical Activity: Not on file  Stress: Not on file  Social Connections: Not on file  Intimate Partner Violence: Not on file    Family History  Problem Relation Age of Onset   Breast cancer Mother    Heart failure Brother    Kidney failure Brother    Alcoholism Brother     Current Outpatient Medications  Medication Sig Dispense Refill   aspirin EC 81 MG EC tablet Take  1 tablet (81 mg total) by mouth daily. 30 tablet 0   atorvastatin (LIPITOR) 40 MG tablet Take 1 tablet (40 mg total) by mouth daily at 6 PM. 30 tablet 0   lisinopril (ZESTRIL) 20 MG tablet Take 1 tablet (20 mg total) by mouth daily. 90 tablet 0   metFORMIN (GLUCOPHAGE) 500 MG tablet Take 1 tablet (500 mg total) by mouth daily with breakfast. 30 tablet 0   nebivolol (BYSTOLIC) 5 MG tablet TAKE 1 TABLET (5 MG TOTAL) BY MOUTH DAILY. 90 tablet 0   oxyCODONE-acetaminophen (PERCOCET) 5-325 MG tablet Take 1 tablet by mouth every 6 (six) hours as needed for severe pain. 12 tablet 0   rivaroxaban (XARELTO) 20 MG TABS tablet Take 1 tablet (20 mg total) by mouth daily with supper. 30 tablet 5   spironolactone  (ALDACTONE) 25 MG tablet Take 1 tablet (25 mg total) by mouth daily. 90 tablet 0   No current facility-administered medications for this visit.    No Known Allergies   REVIEW OF SYSTEMS:  *** '[X]'$  denotes positive finding, '[ ]'$  denotes negative finding Cardiac  Comments:  Chest pain or chest pressure:    Shortness of breath upon exertion:    Short of breath when lying flat:    Irregular heart rhythm:        Vascular    Pain in calf, thigh, or hip brought on by ambulation:    Pain in feet at night that wakes you up from your sleep:     Blood clot in your veins:    Leg swelling:         Pulmonary    Oxygen at home:    Productive cough:     Wheezing:         Neurologic    Sudden weakness in arms or legs:     Sudden numbness in arms or legs:     Sudden onset of difficulty speaking or slurred speech:    Temporary loss of vision in one eye:     Problems with dizziness:         Gastrointestinal    Blood in stool:     Vomited blood:         Genitourinary    Burning when urinating:     Blood in urine:        Psychiatric    Major depression:         Hematologic    Bleeding problems:    Problems with blood clotting too easily:        Skin    Rashes or ulcers:        Constitutional    Fever or chills:      PHYSICAL EXAMINATION:  There were no vitals filed for this visit.  General:  WDWN in NAD; vital signs documented above Gait: Not observed HENT: WNL, normocephalic Pulmonary: normal non-labored breathing , without Rales, rhonchi,  wheezing Cardiac: {Desc; regular/irreg:14544} HR, without  Murmurs {With/Without:20273} carotid bruit*** Abdomen: soft, NT, no masses Skin: {With/Without:20273} rashes Vascular Exam/Pulses:  Right Left  Radial {Exam; arterial pulse strength 0-4:30167} {Exam; arterial pulse strength 0-4:30167}  Ulnar {Exam; arterial pulse strength 0-4:30167} {Exam; arterial pulse strength 0-4:30167}  Femoral {Exam; arterial pulse strength 0-4:30167}  {Exam; arterial pulse strength 0-4:30167}  Popliteal {Exam; arterial pulse strength 0-4:30167} {Exam; arterial pulse strength 0-4:30167}  DP {Exam; arterial pulse strength 0-4:30167} {Exam; arterial pulse strength 0-4:30167}  PT {Exam; arterial pulse strength 0-4:30167} {Exam; arterial pulse strength 0-4:30167}  Extremities: {With/Without:20273} ischemic changes, {With/Without:20273} Gangrene , {With/Without:20273} cellulitis; {With/Without:20273} open wounds;  Musculoskeletal: no muscle wasting or atrophy  Neurologic: A&O X 3;  No focal weakness or paresthesias are detected Psychiatric:  The pt has {Desc; normal/abnormal:11317::"Normal"} affect.   Non-Invasive Vascular Imaging:   ***    ASSESSMENT/PLAN:: 67 y.o. female here for follow up for ***   -***   Karoline Caldwell, PA-C [facility] MD:   Roxanne Mins

## 2022-03-05 ENCOUNTER — Other Ambulatory Visit: Payer: Self-pay | Admitting: Internal Medicine

## 2022-03-05 DIAGNOSIS — I10 Essential (primary) hypertension: Secondary | ICD-10-CM

## 2022-03-06 ENCOUNTER — Ambulatory Visit (INDEPENDENT_AMBULATORY_CARE_PROVIDER_SITE_OTHER): Payer: Managed Care, Other (non HMO) | Admitting: Physician Assistant

## 2022-03-06 ENCOUNTER — Ambulatory Visit (INDEPENDENT_AMBULATORY_CARE_PROVIDER_SITE_OTHER)
Admission: RE | Admit: 2022-03-06 | Discharge: 2022-03-06 | Disposition: A | Discharge status: Home / Self Care | Payer: 59 | Source: Ambulatory Visit | Attending: Surgery | Admitting: Surgery

## 2022-03-06 ENCOUNTER — Ambulatory Visit (HOSPITAL_COMMUNITY)
Admission: RE | Admit: 2022-03-06 | Discharge: 2022-03-06 | Disposition: A | Discharge status: Home / Self Care | Payer: 59 | Source: Ambulatory Visit | Attending: Surgery | Admitting: Surgery

## 2022-03-06 VITALS — BP 164/89 | HR 63 | Temp 97.7°F | Resp 20 | Ht 63.5 in | Wt 166.2 lb

## 2022-03-06 DIAGNOSIS — Z86718 Personal history of other venous thrombosis and embolism: Secondary | ICD-10-CM

## 2022-03-06 DIAGNOSIS — I739 Peripheral vascular disease, unspecified: Secondary | ICD-10-CM | POA: Diagnosis not present

## 2022-03-07 ENCOUNTER — Ambulatory Visit: Payer: Managed Care, Other (non HMO)

## 2022-03-08 ENCOUNTER — Other Ambulatory Visit: Payer: Self-pay

## 2022-03-08 DIAGNOSIS — I739 Peripheral vascular disease, unspecified: Secondary | ICD-10-CM

## 2022-03-09 NOTE — Progress Notes (Addendum)
Office Note     History of Present Illness   Ashley Price is a 67 y.o. (09-Apr-1955) female who presents for follow-up.  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  She underwent LLE angiogram, central venogram, pulmonary angiogram, and mechanical thrombectomy of IVC, left CIV, EIV, CFV, FV on 07/29/2021 by Dr. Stanford Breed.  She received good technical results from mechanical thrombectomy with a large burden of thrombus cleared from the left iliac and femoral veins.  Pulmonary angiogram did not identify any large PE.  She was transition from heparin to Xarelto on POD 1, and was to continue a 35-monthcourse of Xarelto.  She was last seen by our office in August 2023 after her procedure.  She no longer had any leg swelling or pain in her legs.  She also denied any rest pain or claudication of the lower extremities.  She was still on her 681-monthourse of Xarelto.  At follow-up today, she is doing well.  She still has not experienced any more leg swelling.  She also denies any rest pain, claudication, nonhealing wounds of lower extremities.  She finished her 6-35-monthurse of Xarelto about 2 weeks ago.  She takes an aspirin daily.  Past Medical History:  Diagnosis Date   Hypertension    Osteoarthritis    [facility]   Past Surgical History:  Procedure Laterality Date   AMPUTATION Left 08/18/2018   Procedure: AMPUTATION LEFT 2ND TOE;  Surgeon: ChaTania AdeD;  [facility];  Laterality: Left;   PERCUTANEOUS VENOUS THROMBECTOMY,LYSIS WITH INTRAVASCULAR ULTRASOUND (IVUS) Left 07/29/2021   Procedure: PERCUTANEOUS LEFT VENOUS THROMBECTOMY;  Surgeon: HawCherre RobinsD;  [facility];  Service: Vascular;  Laterality: Left;   TUBAL LIGATION Bilateral     Social History   Socioeconomic History   Marital status: Single    Spouse name: Not on file   Number of children: Not on file   Years of education:  Not on file   Highest education level: Not on file  Occupational History   Not on file  Tobacco Use   Smoking status: Every Day    Packs/day: 0.75    Years: 40.00    Total pack years: 30.00    Types: Cigarettes    Last attempt to quit: 12/30/2018    Years since quitting: 3.1    Passive exposure: Never   Smokeless tobacco: Never  Vaping Use   Vaping Use: Never used  Substance and Sexual Activity   Alcohol use: No   Drug use: No   Sexual activity: Not on file  Other Topics Concern   Not on file  Social History Narrative   Lives with her children   Social Determinants of Health   Financial Resource Strain: Not on file  Food Insecurity: Not on file  Transportation Needs: Not on file  Physical Activity: Not on file  Stress: Not on file  Social Connections: Not on file  Intimate Partner Violence: Not on file    Family History  Problem Relation Age of Onset   Breast cancer Mother    Heart failure Brother    Kidney failure Brother    Alcoholism Brother     Current Outpatient Medications  Medication Sig Dispense Refill   aspirin EC 81 MG EC tablet Take 1 tablet (81 mg total) by mouth daily. 30 tablet 0  atorvastatin (LIPITOR) 40 MG tablet Take 1 tablet (40 mg total) by mouth daily at 6 PM. 30 tablet 0   lisinopril (ZESTRIL) 20 MG tablet Take 1 tablet (20 mg total) by mouth daily. 90 tablet 0   nebivolol (BYSTOLIC) 5 MG tablet TAKE 1 TABLET (5 MG TOTAL) BY MOUTH DAILY. 90 tablet 0   oxyCODONE-acetaminophen (PERCOCET) 5-325 MG tablet Take 1 tablet by mouth every 6 (six) hours as needed for severe pain. 12 tablet 0   rivaroxaban (XARELTO) 20 MG TABS tablet Take 1 tablet (20 mg total) by mouth daily with supper. 30 tablet 5   spironolactone (ALDACTONE) 25 MG tablet TAKE 1 TABLET (25 MG TOTAL) BY MOUTH DAILY. 90 tablet 0   metFORMIN (GLUCOPHAGE) 500 MG tablet Take 1 tablet (500 mg total) by mouth daily with breakfast. 30 tablet 0   No current facility-administered  medications for this visit.    No Known Allergies  REVIEW OF SYSTEMS (negative unless checked):   Cardiac:  '[]'$  Chest pain or chest pressure? '[]'$  Shortness of breath upon activity? '[]'$  Shortness of breath when lying flat? '[]'$  Irregular heart rhythm?  Vascular:  '[]'$  Pain in calf, thigh, or hip brought on by walking? '[]'$  Pain in feet at night that wakes you up from your sleep? '[]'$  Blood clot in your veins? '[]'$  Leg swelling?  Pulmonary:  '[]'$  Oxygen at home? '[]'$  Productive cough? '[]'$  Wheezing?  Neurologic:  '[]'$  Sudden weakness in arms or legs? '[]'$  Sudden numbness in arms or legs? '[]'$  Sudden onset of difficult speaking or slurred speech? '[]'$  Temporary loss of vision in one eye? '[]'$  Problems with dizziness?  Gastrointestinal:  '[]'$  Blood in stool? '[]'$  Vomited blood?  Genitourinary:  '[]'$  Burning when urinating? '[]'$  Blood in urine?  Psychiatric:  '[]'$  Major depression  Hematologic:  '[]'$  Bleeding problems? '[]'$  Problems with blood clotting?  Dermatologic:  '[]'$  Rashes or ulcers?  Constitutional:  '[]'$  Fever or chills?  Ear/Nose/Throat:  '[]'$  Change in hearing? '[]'$  Nose bleeds? '[]'$  Sore throat?  Musculoskeletal:  '[]'$  Back pain? '[]'$  Joint pain? '[]'$  Muscle pain?   Physical Examination     Vitals:   03/06/22 0855  BP: (!) 164/89  Pulse: 63  Resp: 20  Temp: 97.7 F (36.5 C)  TempSrc: Temporal  SpO2: 100%  Weight: 166 lb 3.2 oz (75.4 kg)  Height: 5' 3.5" (1.613 m)   Body mass index is 28.98 kg/m.  General:  WDWN in NAD; vital signs documented above Gait: Not observed HENT: WNL, normocephalic Pulmonary: normal non-labored breathing Cardiac: regular rate and rhythm Abdomen: soft, NT, no masses Skin: without rashes Vascular Exam/Pulses: Monophasic DP/PT bilaterally Extremities: Trace edema in BLE.  No tissue ischemia, gangrene, cellulitis Musculoskeletal: no muscle wasting or atrophy  Neurologic: A&O X 3;  No focal weakness or paresthesias are detected Psychiatric:  The pt has  Normal affect.  Non-invasive Vascular Imaging   LLE Venous Duplex (03/06/2022):  No evidence of DVT in left lower extremity.  Findings appear improved compared to last examination  Left IVC/Iliac Venous Duplex (03/06/2022): No evidence of thrombus in IVC and iliac veins.  No evidence of thrombus in left common iliac vein.   Medical Decision Making   Ashley Price is a 67 y.o. female who presents for follow-up s/p thrombectomy for extensive LLE DVT in 2023  Based on the patient's Plex study, there is no evidence of DVT in the IVC, iliac veins, or left lower extremity.  Findings in the left lower extremity appear improved  compared to last duplex in August 2023. She no longer has any lower extremity swelling.  She wears her compression stockings as needed.  She finished her 58-monthcourse of Xarelto about 2 weeks ago.  She still takes an aspirin daily. She denies any rest pain, claudication, nonhealing wounds of lower extremities.  Her last ABIs in August 2023 mistreated moderate peripheral arterial disease in bilateral lower extremities.  She will be due for another set of ABIs in 6 months. The patient no longer needs to be on Xarelto.  She can continue with daily aspirin and statin.  She can follow-up with our office in 6 months with repeat ABIs   Ernestine Rohman PEmmit Alexanders PA-C [facility] [place] [phone] 03/09/2022, 12:12 PM  Clinic MD: BTrula Slade

## 2022-03-14 LAB — HM DIABETES EYE EXAM

## 2022-04-01 ENCOUNTER — Other Ambulatory Visit: Payer: Self-pay | Admitting: Internal Medicine

## 2022-04-01 DIAGNOSIS — I10 Essential (primary) hypertension: Secondary | ICD-10-CM

## 2022-04-01 DIAGNOSIS — I739 Peripheral vascular disease, unspecified: Secondary | ICD-10-CM

## 2022-04-01 DIAGNOSIS — E1151 Type 2 diabetes mellitus with diabetic peripheral angiopathy without gangrene: Secondary | ICD-10-CM

## 2022-04-05 ENCOUNTER — Other Ambulatory Visit: Payer: Self-pay | Admitting: Internal Medicine

## 2022-04-05 DIAGNOSIS — E1140 Type 2 diabetes mellitus with diabetic neuropathy, unspecified: Secondary | ICD-10-CM

## 2022-05-02 ENCOUNTER — Other Ambulatory Visit: Payer: Self-pay | Admitting: Internal Medicine

## 2022-05-02 DIAGNOSIS — E785 Hyperlipidemia, unspecified: Secondary | ICD-10-CM

## 2022-05-03 ENCOUNTER — Ambulatory Visit (INDEPENDENT_AMBULATORY_CARE_PROVIDER_SITE_OTHER): Payer: 59 | Admitting: Internal Medicine

## 2022-05-03 ENCOUNTER — Encounter: Payer: Self-pay | Admitting: Internal Medicine

## 2022-05-03 VITALS — BP 132/82 | HR 66 | Temp 98.2°F | Ht 63.5 in | Wt 170.0 lb

## 2022-05-03 DIAGNOSIS — Z0000 Encounter for general adult medical examination without abnormal findings: Secondary | ICD-10-CM

## 2022-05-03 DIAGNOSIS — E785 Hyperlipidemia, unspecified: Secondary | ICD-10-CM

## 2022-05-03 DIAGNOSIS — I10 Essential (primary) hypertension: Secondary | ICD-10-CM | POA: Diagnosis not present

## 2022-05-03 DIAGNOSIS — I739 Peripheral vascular disease, unspecified: Secondary | ICD-10-CM

## 2022-05-03 DIAGNOSIS — N1831 Chronic kidney disease, stage 3a: Secondary | ICD-10-CM | POA: Diagnosis not present

## 2022-05-03 DIAGNOSIS — I700 Atherosclerosis of aorta: Secondary | ICD-10-CM | POA: Insufficient documentation

## 2022-05-03 DIAGNOSIS — Z0001 Encounter for general adult medical examination with abnormal findings: Secondary | ICD-10-CM

## 2022-05-03 DIAGNOSIS — E1151 Type 2 diabetes mellitus with diabetic peripheral angiopathy without gangrene: Secondary | ICD-10-CM

## 2022-05-03 LAB — BASIC METABOLIC PANEL
BUN: 16 mg/dL (ref 6–23)
CO2: 25 mEq/L (ref 19–32)
Calcium: 9.6 mg/dL (ref 8.4–10.5)
Chloride: 105 mEq/L (ref 96–112)
Creatinine, Ser: 1.01 mg/dL (ref 0.40–1.20)
GFR: 57.38 mL/min — ABNORMAL LOW (ref >60.00)
Glucose, Bld: 121 mg/dL — ABNORMAL HIGH (ref 70–99)
Potassium: 4 mEq/L (ref 3.5–5.1)
Sodium: 139 mEq/L (ref 135–145)

## 2022-05-03 LAB — CBC WITH DIFFERENTIAL/PLATELET
Basophils Absolute: 0.1 10*3/uL (ref 0.0–0.1)
Basophils Relative: 0.9 % (ref 0.0–3.0)
Eosinophils Absolute: 0.3 10*3/uL (ref 0.0–0.7)
Eosinophils Relative: 2.1 % (ref 0.0–5.0)
HCT: 42.6 % (ref 36.0–46.0)
Hemoglobin: 14.1 g/dL (ref 12.0–15.0)
Lymphocytes Relative: 27.5 % (ref 12.0–46.0)
Lymphs Abs: 3.6 10*3/uL (ref 0.7–4.0)
MCHC: 33.1 g/dL (ref 30.0–36.0)
MCV: 88.3 fl (ref 78.0–100.0)
Monocytes Absolute: 0.8 10*3/uL (ref 0.1–1.0)
Monocytes Relative: 6.3 % (ref 3.0–12.0)
Neutro Abs: 8.3 10*3/uL — ABNORMAL HIGH (ref 1.4–7.7)
Neutrophils Relative %: 63.2 % (ref 43.0–77.0)
Platelets: 272 10*3/uL (ref 150.0–400.0)
RBC: 4.83 Mil/uL (ref 3.87–5.11)
RDW: 14.7 % (ref 11.5–15.5)
WBC: 13.1 10*3/uL — ABNORMAL HIGH (ref 4.0–10.5)

## 2022-05-03 LAB — LIPID PANEL
Cholesterol: 102 mg/dL (ref 0–200)
HDL: 42.5 mg/dL (ref >39.00)
LDL Cholesterol: 42 mg/dL (ref 0–99)
NonHDL: 59.82
Total CHOL/HDL Ratio: 2
Triglycerides: 90 mg/dL (ref 0.0–149.0)
VLDL: 18 mg/dL (ref 0.0–40.0)

## 2022-05-03 LAB — HEPATIC FUNCTION PANEL
ALT: 11 U/L (ref 0–35)
AST: 13 U/L (ref 0–37)
Albumin: 4.3 g/dL (ref 3.5–5.2)
Alkaline Phosphatase: 76 U/L (ref 39–117)
Bilirubin, Direct: 0.1 mg/dL (ref 0.0–0.3)
Total Bilirubin: 0.4 mg/dL (ref 0.2–1.2)
Total Protein: 7.6 g/dL (ref 6.0–8.3)

## 2022-05-03 LAB — HEMOGLOBIN A1C: Hgb A1c MFr Bld: 6.7 % — ABNORMAL HIGH (ref 4.6–6.5)

## 2022-05-03 MED ORDER — METFORMIN HCL ER 500 MG PO TB24: 500.0000 mg | ORAL_TABLET | Freq: Every day | ORAL | 1 refills | Status: DC

## 2022-05-03 MED ORDER — ASPIRIN 81 MG PO TBEC: 81.0000 mg | DELAYED_RELEASE_TABLET | Freq: Every day | ORAL | 1 refills | Status: DC

## 2022-05-03 MED ORDER — DAPAGLIFLOZIN PROPANEDIOL 10 MG PO TABS: 10.0000 mg | ORAL_TABLET | Freq: Every day | ORAL | 1 refills | Status: DC

## 2022-05-03 MED ORDER — RIVAROXABAN 2.5 MG PO TABS: 2.5000 mg | ORAL_TABLET | Freq: Two times a day (BID) | ORAL | 1 refills | Status: DC

## 2022-05-03 NOTE — Patient Instructions (Signed)

## 2022-05-03 NOTE — Progress Notes (Signed)
Subjective:  Patient ID: Ashley Price, female    DOB: 05-08-1955  Age: 67 y.o. MRN: [id number]  CC: Annual Exam, Diabetes, and Hyperlipidemia   HPI Ashley Price presents for a CPX and f/up ----  She has not quit smoking.  She denies chest pain, shortness of breath, diaphoresis, or edema.  Outpatient Medications Prior to Visit  Medication Sig Dispense Refill   atorvastatin (LIPITOR) 40 MG tablet 1 TABLET AT 6 PM ORALLY ONCE A DAY 90 DAYS 90 tablet 0   lisinopril (ZESTRIL) 20 MG tablet TAKE 1 TABLET BY MOUTH EVERY DAY 90 tablet 0   nebivolol (BYSTOLIC) 5 MG tablet TAKE 1 TABLET (5 MG TOTAL) BY MOUTH DAILY. 90 tablet 0   spironolactone (ALDACTONE) 25 MG tablet TAKE 1 TABLET (25 MG TOTAL) BY MOUTH DAILY. 90 tablet 0   aspirin EC 81 MG EC tablet Take 1 tablet (81 mg total) by mouth daily. 30 tablet 0   oxyCODONE-acetaminophen (PERCOCET) 5-325 MG tablet Take 1 tablet by mouth every 6 (six) hours as needed for severe pain. 12 tablet 0   rivaroxaban (XARELTO) 20 MG TABS tablet Take 1 tablet (20 mg total) by mouth daily with supper. 30 tablet 5   metFORMIN (GLUCOPHAGE) 500 MG tablet Take 1 tablet (500 mg total) by mouth daily with breakfast. 30 tablet 0   No facility-administered medications prior to visit.    ROS Review of Systems  Constitutional: Negative.  Negative for diaphoresis and fatigue.  HENT: Negative.    Eyes: Negative.   Respiratory:  Negative for cough, chest tightness, shortness of breath and wheezing.   Cardiovascular:  Negative for chest pain, palpitations and leg swelling.  Gastrointestinal:  Negative for abdominal pain, constipation, diarrhea, nausea and vomiting.  Endocrine: Negative.   Genitourinary: Negative.  Negative for difficulty urinating.  Musculoskeletal: Negative.   Skin: Negative.   Neurological:  Negative for dizziness, weakness and headaches.  Hematological:  Negative for adenopathy. Does not bruise/bleed easily.  Psychiatric/Behavioral:  Negative.      Objective:  BP 132/82 (BP Location: Left Arm, Patient Position: Sitting, Cuff Size: Large)   Pulse 66   Temp 98.2 F (36.8 C) (Oral)   Ht 5' 3.5" (1.613 m)   Wt 170 lb (77.1 kg)   SpO2 98%   BMI 29.64 kg/m   BP Readings from Last 3 Encounters:  05/03/22 132/82  03/06/22 (!) 164/89  02/07/22 (!) 155/86    Wt Readings from Last 3 Encounters:  05/03/22 170 lb (77.1 kg)  03/06/22 166 lb 3.2 oz (75.4 kg)  02/07/22 165 lb (74.8 kg)    Physical Exam Vitals reviewed.  Constitutional:      Appearance: Normal appearance.  HENT:     Nose: Nose normal.     Mouth/Throat:     Mouth: Mucous membranes are moist.  Eyes:     General: No scleral icterus.    Conjunctiva/sclera: Conjunctivae normal.  Cardiovascular:     Rate and Rhythm: Normal rate and regular rhythm.     Heart sounds: No murmur heard. Pulmonary:     Effort: Pulmonary effort is normal.     Breath sounds: No stridor. No wheezing, rhonchi or rales.  Abdominal:     General: Abdomen is flat.     Palpations: There is no mass.     Tenderness: There is no abdominal tenderness. There is no guarding.     Hernia: No hernia is present.  Musculoskeletal:  General: Normal range of motion.     Cervical back: Neck supple.     Right lower leg: No edema.     Left lower leg: No edema.  Lymphadenopathy:     Cervical: No cervical adenopathy.  Skin:    General: Skin is warm and dry.     Coloration: Skin is not pale.  Neurological:     General: No focal deficit present.     Mental Status: She is alert. Mental status is at baseline.  Psychiatric:        Mood and Affect: Mood normal.        Behavior: Behavior normal.     Lab Results  Component Value Date   WBC 13.1 (H) 05/03/2022   HGB 14.1 05/03/2022   HCT 42.6 05/03/2022   PLT 272.0 05/03/2022   GLUCOSE 121 (H) 05/03/2022   CHOL 102 05/03/2022   TRIG 90.0 05/03/2022   HDL 42.50 05/03/2022   LDLCALC 42 05/03/2022   ALT 11 05/03/2022   AST 13  05/03/2022   NA 139 05/03/2022   K 4.0 05/03/2022   CL 105 05/03/2022   CREATININE 1.01 05/03/2022   BUN 16 05/03/2022   CO2 25 05/03/2022   TSH 2.510 10/06/2021   INR 1.2 07/29/2021   HGBA1C 6.7 (H) 05/03/2022   MICROALBUR 2.7 (H) 11/08/2021    VAS [place] LOWER EXTREMITY VENOUS (DVT)  Result Date: 03/06/2022  Lower Venous DVT Study Patient Name:  JEARLENE BRIDWELL  Date of Exam:   03/06/2022 Medical Rec #: [id number]       Accession #:    [id number] Date of Birth: 12-27-1955        Patient Gender: F Patient Age:   41 years Exam Location:  [facility] Procedure:      VAS [place] LOWER EXTREMITY VENOUS (DVT) Referring Phys: Lemar Livings --------------------------------------------------------------------------------  Indications: S/p mechanical thrombectomy 07/29/2021.  Comparison Study: 09/07/2021 left lower extremity venous duplex-                   LEFT:                   - There is focal intimal thickening in the LT Mid FV                   suggestive of Chronic or resolved DVT.                   - Findings consistent with chronic superficial vein thrombosis                   involving the left small saphenous vein.                   - Findings appear improved from previous examination.                   - s/p LT venous thrombectomy same day Positive LT LE DVT study                   07/29/21. Performing Technologist: Gertie Fey MHA, RDMS, RVT, RDCS  Examination Guidelines: A complete evaluation includes B-mode imaging, spectral Doppler, color Doppler, and power Doppler as needed of all accessible portions of each vessel. Bilateral testing is considered an integral part of a complete examination. Limited examinations for reoccurring indications may be performed as noted. The reflux portion of the exam is performed with the patient in reverse Trendelenburg.  +-----+---------------+---------+-----------+----------+--------------+  RIGHTCompressibilityPhasicitySpontaneityPropertiesThrombus  Aging +-----+---------------+---------+-----------+----------+--------------+ CFV  Full           Yes      Yes                                 +-----+---------------+---------+-----------+----------+--------------+   +---------+---------------+---------+-----------+----------+--------------+ LEFT     CompressibilityPhasicitySpontaneityPropertiesThrombus Aging +---------+---------------+---------+-----------+----------+--------------+ CFV      Full           Yes      Yes                                 +---------+---------------+---------+-----------+----------+--------------+ SFJ      Full                                                        +---------+---------------+---------+-----------+----------+--------------+ FV Prox  Full                                                        +---------+---------------+---------+-----------+----------+--------------+ FV Mid   Full                                                        +---------+---------------+---------+-----------+----------+--------------+ FV DistalFull                                                        +---------+---------------+---------+-----------+----------+--------------+ PFV      Full                                                        +---------+---------------+---------+-----------+----------+--------------+ POP      Full           Yes      Yes                                 +---------+---------------+---------+-----------+----------+--------------+ PTV      Full                                                        +---------+---------------+---------+-----------+----------+--------------+ PERO     Full                                                        +---------+---------------+---------+-----------+----------+--------------+  Left Technical Findings: Not visualized segments include limited visualization of calf veins.   Summary: RIGHT: - No evidence  of common femoral vein obstruction.  LEFT: - There is no evidence of deep vein thrombosis in the lower extremity. However, portions of this examination were limited- see technologist comments above. Findings appear improved when compared to previous examination.  - No cystic structure found in the popliteal fossa.  *See table(s) above for measurements and observations. Electronically signed by Coral Else MD on 03/06/2022 at 10:07:43 PM.    Final    VAS [place] IVC/ILIAC (VENOUS ONLY)  Result Date: 03/06/2022 IVC/ILIAC STUDY Patient Name:  TYANNAH SANE  Date of Exam:   03/06/2022 Medical Rec #: [id number]       Accession #:    [id number] Date of Birth: 07-25-1955        Patient Gender: F Patient Age:   57 years Exam Location:  [facility] Procedure:      VAS [place] IVC/ILIAC (VENOUS ONLY) Referring Phys: Lemar Livings --------------------------------------------------------------------------------  Indications: S/p mechanical thrombectomy Vascular Interventions: 07/29/21 Percutaneous left venous thrombectomy. Limitations: Air/bowel gas.  Comparison Study: 09/07/2021 IVC/ left iliac vein duplex- The IVC, common iliac,                   external iliac, and common femoral vein appear patent post                   procedure. Performing Technologist: Gertie Fey MHA, RDMS, RVT, RDCS  Examination Guidelines: A complete evaluation includes B-mode imaging, spectral Doppler, color Doppler, and power Doppler as needed of all accessible portions of each vessel. Bilateral testing is considered an integral part of a complete examination. Limited examinations for reoccurring indications may be performed as noted.  IVC/Iliac Findings: +----------+------+--------+--------+    IVC    PatentThrombusComments +----------+------+--------+--------+ IVC Prox  patent                 +----------+------+--------+--------+ IVC Mid   patent                 +----------+------+--------+--------+ IVC Distalpatent                  +----------+------+--------+--------+  +----------------+---------+-----------+---------+-----------+----------------+       CIV       RT-PatentRT-ThrombusLT-PatentLT-Thrombus    Comments     +----------------+---------+-----------+---------+-----------+----------------+ Common Iliac                         patent                              Prox                                                                     +----------------+---------+-----------+---------+-----------+----------------+ Common Iliac Mid                                           Unable to  adequately                                                               visualize                                                               secondary to                                                           overlying bowel                                                                gas        +----------------+---------+-----------+---------+-----------+----------------+ Common Iliac                                               Unable to     Distal                                                     adequately                                                               visualize                                                               secondary to                                                           overlying bowel  gas        +----------------+---------+-----------+---------+-----------+----------------+  +-------------------------+---------+-----------+---------+-----------+--------+            EIV           RT-PatentRT-ThrombusLT-PatentLT-ThrombusComments +-------------------------+---------+-----------+---------+-----------+--------+ External Iliac Vein Prox                       patent                      +-------------------------+---------+-----------+---------+-----------+--------+ External Iliac Vein Mid                       patent                      +-------------------------+---------+-----------+---------+-----------+--------+ External Iliac Vein                           patent                      Distal                                                                    +-------------------------+---------+-----------+---------+-----------+--------+   Summary: IVC/Iliac: No evidence of thrombus in IVC and Iliac veins. There is no evidence of thrombus involving the left common iliac vein. There is no evidence of thrombus involving the left external iliac vein. Incidental finding: There is evidence of elevated velocities suggestive 50-74% stenosis of the left proximal common iliac artery.  *See table(s) above for measurements and observations.  Electronically signed by Coral Else MD on 03/06/2022 at 10:05:57 PM.    Final     Assessment & Plan:    Essential hypertension- Her blood pressure is adequately well-controlled. -     Basic metabolic panel; Future -     CBC with Differential/Platelet; Future -     Hepatic function panel; Future  Type 2 diabetes mellitus with diabetic peripheral angiopathy without gangrene, without long-term current use of - Her blood sugar is adequately well-controlled. -     Basic metabolic panel; Future -     Hemoglobin A1c; Future -     Hepatic function panel; Future -     Dapagliflozin Propanediol; Take 1 tablet (10 mg total) by mouth daily before breakfast.  Dispense: 90 tablet; Refill: 1 -     metFORMIN HCl ER; Take 1 tablet (500 mg total) by mouth daily with breakfast.  Dispense: 90 tablet; Refill: 1  Dyslipidemia, goal LDL below 70 - LDL goal achieved. Doing well on the statin  -     Lipid panel; Future  Atherosclerosis of aorta- Risk factor modifications addressed.  Stage 3a chronic kidney disease -      Dapagliflozin Propanediol; Take 1 tablet (10 mg total) by mouth daily before breakfast.  Dispense: 90 tablet; Refill: 1  PAD (peripheral artery disease)- I recommended low-dose Xarelto to reduce the risk of complications and limb threatening ischemia. -     Aspirin; Take 1 tablet (81 mg total) by mouth daily. Swallow whole.  Dispense: 90 tablet; Refill: 1 -     Rivaroxaban; Take 1 tablet (2.5 mg total) by mouth 2 (two) times daily.  Dispense:  180 tablet; Refill: 1     Follow-up: Return in about 6 months (around 11/02/2022).  Sanda Linger, MD

## 2022-05-04 DIAGNOSIS — I739 Peripheral vascular disease, unspecified: Secondary | ICD-10-CM | POA: Diagnosis not present

## 2022-05-04 DIAGNOSIS — L603 Nail dystrophy: Secondary | ICD-10-CM | POA: Diagnosis not present

## 2022-05-04 DIAGNOSIS — L84 Corns and callosities: Secondary | ICD-10-CM | POA: Diagnosis not present

## 2022-05-04 DIAGNOSIS — M79671 Pain in right foot: Secondary | ICD-10-CM | POA: Diagnosis not present

## 2022-05-04 DIAGNOSIS — E1151 Type 2 diabetes mellitus with diabetic peripheral angiopathy without gangrene: Secondary | ICD-10-CM | POA: Diagnosis not present

## 2022-05-04 DIAGNOSIS — M79672 Pain in left foot: Secondary | ICD-10-CM | POA: Diagnosis not present

## 2022-05-08 ENCOUNTER — Encounter: Payer: Self-pay | Admitting: Internal Medicine

## 2022-05-08 DIAGNOSIS — Z0001 Encounter for general adult medical examination with abnormal findings: Secondary | ICD-10-CM | POA: Insufficient documentation

## 2022-05-08 NOTE — Assessment & Plan Note (Signed)
Exam completed Labs reviewed Vaccines reviewed Cancer screenings are up-to-date Patient education was given. 

## 2022-05-31 ENCOUNTER — Other Ambulatory Visit: Payer: Self-pay | Admitting: Internal Medicine

## 2022-05-31 DIAGNOSIS — I10 Essential (primary) hypertension: Secondary | ICD-10-CM

## 2022-06-01 MED ORDER — SPIRONOLACTONE 25 MG PO TABS
25.0000 mg | ORAL_TABLET | Freq: Every day | ORAL | 0 refills | Status: DC
Start: 2022-06-01 — End: 2022-06-20

## 2022-06-03 ENCOUNTER — Encounter: Payer: Self-pay | Admitting: Internal Medicine

## 2022-06-05 ENCOUNTER — Other Ambulatory Visit: Payer: Self-pay | Admitting: Internal Medicine

## 2022-06-05 ENCOUNTER — Telehealth: Payer: Self-pay

## 2022-06-05 DIAGNOSIS — I10 Essential (primary) hypertension: Secondary | ICD-10-CM

## 2022-06-05 NOTE — Progress Notes (Signed)
   06/05/2022  Patient ID: Ashley Price, female   DOB: 04-04-1955, 67 y.o.   MRN: [id number]  Patient outreach to schedule telephone visit to address medication questions/concerns.  PCP, Dr. Yetta Barre, routed a request to contact the patient to address.  Scheduled for Wednesday, 5/8 at 1pm to do medication review.  Lenna Gilford, PharmD, DPLA

## 2022-06-07 ENCOUNTER — Other Ambulatory Visit: Payer: Managed Care, Other (non HMO)

## 2022-06-07 NOTE — Progress Notes (Signed)
06/07/2022 Name: Ashley Price MRN: [id number] DOB: 11-07-1955  Ashley Price is a 67 y.o. year old female who presented for a telephone visit.   They were referred to the pharmacist by their PCP for assistance in managing complex medication management.   Patient is participating in a Managed Medicaid Plan:  No  Subjective:  Care Team: Primary Care Provider: [name], MD   Medication Access/Adherence Current Pharmacy:  [id number] - [place],  - [address]  [address] Phone: [phone] Fax: [phone]  -Patient reports affordability concerns with their medications: No  -Patient reports access/transportation concerns to their pharmacy: No  -Patient reports adherence concerns with their medications:  No     Diabetes: Current medications: Farxiga 10mg  daily, metformin XR 500mg  daily  -Current glucose readings: FBG 110-130 -Patient endorses testing glucose daily -States she is tolerating current medication regimen well with no adverse effects noted  Hypertension: Current medications: lisinopril 20mg  daily, spironolactone 25mg  daily, nebivolol 5mg  daily  -Home BP 124/80 per patient -States she is not taking nebivolol and believes Dr. Yetta Barre stopped medication -Order was sent to pharmacy 5/6 with no notes of stopping found in chart  Hyperlipidemia/ASCVD Risk Reduction Current lipid lowering medications: atorvastatin 40mg  daily Antiplatelet regimen: ASA 81mg  daily  -Patient states she is not taking atorvastatin either -New 3 month script was sent in April and no notes of stopping in chart  Objective: Lab Results  Component Value Date   HGBA1C 6.7 (H) 05/03/2022   Lab Results  Component Value Date   CREATININE 1.01 05/03/2022   BUN 16 05/03/2022   NA 139 05/03/2022   K 4.0 05/03/2022   CL 105 05/03/2022   CO2 25 05/03/2022   Lab Results  Component Value Date   CHOL 102 05/03/2022   HDL 42.50 05/03/2022    LDLCALC 42 05/03/2022   TRIG 90.0 05/03/2022   CHOLHDL 2 05/03/2022   Medications Reviewed Today     Reviewed by Lenna Gilford, RPH (Pharmacist) on 06/07/22 at 1333  Med List Status: <None>   Medication Order Taking? Sig Documenting Provider Last Dose Status Informant  aspirin EC 81 MG tablet [id number] Yes Take 1 tablet (81 mg total) by mouth daily. Swallow whole. [name], MD Taking Active   atorvastatin (LIPITOR) 40 MG tablet [id number] No 1 TABLET AT 6 PM ORALLY ONCE A DAY 90 DAYS  Patient not taking: Reported on 06/07/2022   [name], MD Not Taking Active   dapagliflozin propanediol (FARXIGA) 10 MG TABS tablet [id number] Yes Take 1 tablet (10 mg total) by mouth daily before breakfast. [name], MD Taking Active   lisinopril (ZESTRIL) 20 MG tablet [id number] Yes TAKE 1 TABLET BY MOUTH EVERY DAY [name], MD Taking Active   metFORMIN (GLUCOPHAGE-XR) 500 MG 24 hr tablet [id number] Yes Take 1 tablet (500 mg total) by mouth daily with breakfast. [name], MD Taking Active   nebivolol (BYSTOLIC) 5 MG tablet [id number] No TAKE 1 TABLET (5 MG TOTAL) BY MOUTH DAILY.  Patient not taking: Reported on 06/07/2022   [name], MD Not Taking Active   rivaroxaban (XARELTO) 2.5 MG TABS tablet [id number] Yes Take 1 tablet (2.5 mg total) by mouth 2 (two) times daily. [name], MD Taking Active   spironolactone (ALDACTONE) 25 MG tablet [id number] Yes Take 1 tablet (25 mg total) by mouth daily. [name], MD Taking Active  Assessment/Plan:   Hypertension: - Currently controlled - Consulting Dr. Yetta Barre regarding need for nebivolol and will notify patient if this needs to be filled and restarted   Hyperlipidemia/ASCVD Risk Reduction: - Currently controlled.  - Consulting Dr. Yetta Barre regarding atorvastatin; but based on patient history and other disease states, I recommend she start taking again  Follow Up Plan: Will inform patient if medications  are to be restarted and schedule follow-up if needed  Lenna Gilford, PharmD, DPLA

## 2022-06-08 ENCOUNTER — Telehealth: Payer: Self-pay

## 2022-06-08 NOTE — Progress Notes (Signed)
   06/08/2022  Patient ID: Ashley Price, female   DOB: 02-Aug-1955, 67 y.o.   MRN: [id number]  Outreach attempt to inform patient she does need to be taking the nebivolol and atorvastatin prescriptions.  [facility] last pick up in Feb).  Atorvastatin was picked up 4/3 for 90 days, so she should have at home.  Also wanted to address question she sent Dr. Yetta Barre around potential side effects from recent change to metformin XR.  Left generic voicemail with my direct number for her to call back at her convenience.  \  Lenna Gilford, PharmD, DPLA

## 2022-06-09 ENCOUNTER — Telehealth: Payer: Self-pay | Admitting: Internal Medicine

## 2022-06-09 NOTE — Telephone Encounter (Signed)
Ashley [facility] with [facility] called to follow up regarding clearance for patient to hold Xarelto for colonoscopy scheduled for Tuesday. They need an answer today as patient will need to start holding medication over the weekend. Office requests that in Dr. Yetta Barre absence we check with another provider for clearance so patient does not have to r/s procedure. Please call back ASAP to [phone]

## 2022-06-09 NOTE — Telephone Encounter (Signed)
Form given to Dr. Lawerance Bach for completion and signature.   Form has been faxed back to [facility].

## 2022-06-09 NOTE — Telephone Encounter (Signed)
Taking for PAD - preventive dose.  -- ok to hold

## 2022-06-12 ENCOUNTER — Telehealth: Payer: Self-pay

## 2022-06-12 DIAGNOSIS — E1151 Type 2 diabetes mellitus with diabetic peripheral angiopathy without gangrene: Secondary | ICD-10-CM

## 2022-06-12 NOTE — Progress Notes (Signed)
   06/12/2022  Patient ID: Ashley Price, female   DOB: 05-Feb-1955, 67 y.o.   MRN: [id number]  Patient outreach to inform [name] that she does need to be taking her atorvastatin 40mg  daily and nebivolol 5mg  daily.  Informed her that these were prescribed to help control her cholesterol and blood pressure; and that Dr. Yetta Barre did intend for her to still be on these.  Patient stated understanding.  I also addressed the reported brain fog she reported to Dr. Yetta Barre after switching from IR metformin to XR.  She states this has improved.  Educated her that metformin itself does not typically cause this, but it can decrease Vitamin B12 levels which could cause brain fog.    Recommend B12 level and supplementation if needed.  Lenna Gilford, PharmD, DPLA

## 2022-06-12 NOTE — Progress Notes (Signed)
   06/12/2022  Patient ID: Ashley Price, female   DOB: [birth year], 67 y.o.   MRN: [id number]  Subjective/Objective: Daughter, Ashley [facility])  called back after my earlier telephone call with Ashley Price.  Medication Concern -States that patient is followed by neurology and has been at baseline until recent change from immediate release metformin to extended release at the end of March -Daughter states Ashley Price has been more foggy with increased lapses in memory  -She has also had a marked increase in appetite since the change -Does not endorse any adverse effects with immediate release metformin (including GI) -Of note, Ashley Price was started at the same time as this medication switch.  Assessment/Plan:  Medication Concern -Advised that I recommended a B12 lab to be drawn, as metformin can decrease this value, causing some brain fog -Unlikely that Ashley Price is the culprit; I did counsel on proper hydration while taking this medication to help prevent unwanted side effects.  Would recommend Scr, BUN labs when checking B12, also. -Since there were no reported unwanted side effects with immediate release metformin, suggesting that patient switch back to IR from the XR version -Pending prescription for Ashley Price to sign if in agreement -Daughter plans to get otc B12 for patient to start taking daily, and will contact office if no improvements after 2 weeks of taking immediate release metformin  Ashley Price, PharmD, DPLA

## 2022-06-13 DIAGNOSIS — D122 Benign neoplasm of ascending colon: Secondary | ICD-10-CM | POA: Diagnosis not present

## 2022-06-13 DIAGNOSIS — D125 Benign neoplasm of sigmoid colon: Secondary | ICD-10-CM | POA: Diagnosis not present

## 2022-06-13 DIAGNOSIS — K5289 Other specified noninfective gastroenteritis and colitis: Secondary | ICD-10-CM | POA: Diagnosis not present

## 2022-06-13 DIAGNOSIS — K5730 Diverticulosis of large intestine without perforation or abscess without bleeding: Secondary | ICD-10-CM | POA: Diagnosis not present

## 2022-06-13 DIAGNOSIS — Z86010 Personal history of colonic polyps: Secondary | ICD-10-CM | POA: Diagnosis not present

## 2022-06-13 DIAGNOSIS — Z09 Encounter for follow-up examination after completed treatment for conditions other than malignant neoplasm: Secondary | ICD-10-CM | POA: Diagnosis not present

## 2022-06-13 LAB — HM COLONOSCOPY

## 2022-06-16 MED ORDER — METFORMIN HCL 500 MG PO TABS
500.0000 mg | ORAL_TABLET | Freq: Every day | ORAL | 0 refills | Status: DC
Start: 2022-06-16 — End: 2022-11-23

## 2022-06-20 ENCOUNTER — Other Ambulatory Visit: Payer: Self-pay | Admitting: Internal Medicine

## 2022-06-20 DIAGNOSIS — I10 Essential (primary) hypertension: Secondary | ICD-10-CM

## 2022-06-21 DIAGNOSIS — K5289 Other specified noninfective gastroenteritis and colitis: Secondary | ICD-10-CM | POA: Diagnosis not present

## 2022-06-21 DIAGNOSIS — D125 Benign neoplasm of sigmoid colon: Secondary | ICD-10-CM | POA: Diagnosis not present

## 2022-06-25 ENCOUNTER — Other Ambulatory Visit: Payer: Self-pay | Admitting: Internal Medicine

## 2022-06-25 DIAGNOSIS — E1151 Type 2 diabetes mellitus with diabetic peripheral angiopathy without gangrene: Secondary | ICD-10-CM

## 2022-06-25 DIAGNOSIS — I10 Essential (primary) hypertension: Secondary | ICD-10-CM

## 2022-06-25 DIAGNOSIS — I739 Peripheral vascular disease, unspecified: Secondary | ICD-10-CM

## 2022-06-27 ENCOUNTER — Other Ambulatory Visit: Payer: Self-pay | Admitting: Internal Medicine

## 2022-06-27 ENCOUNTER — Other Ambulatory Visit (HOSPITAL_COMMUNITY): Payer: Self-pay

## 2022-06-27 DIAGNOSIS — I739 Peripheral vascular disease, unspecified: Secondary | ICD-10-CM

## 2022-06-27 DIAGNOSIS — E1151 Type 2 diabetes mellitus with diabetic peripheral angiopathy without gangrene: Secondary | ICD-10-CM

## 2022-06-27 DIAGNOSIS — I10 Essential (primary) hypertension: Secondary | ICD-10-CM

## 2022-06-27 MED ORDER — LISINOPRIL 20 MG PO TABS
20.0000 mg | ORAL_TABLET | Freq: Every day | ORAL | 0 refills | Status: DC
Start: 2022-06-27 — End: 2022-09-26
  Filled 2022-06-27: qty 90, 90d supply, fill #0

## 2022-07-07 LAB — HM COLONOSCOPY

## 2022-07-10 ENCOUNTER — Encounter: Payer: Self-pay | Admitting: Gastroenterology

## 2022-07-29 ENCOUNTER — Other Ambulatory Visit: Payer: Self-pay | Admitting: Internal Medicine

## 2022-07-29 DIAGNOSIS — E785 Hyperlipidemia, unspecified: Secondary | ICD-10-CM

## 2022-08-02 DIAGNOSIS — L603 Nail dystrophy: Secondary | ICD-10-CM | POA: Diagnosis not present

## 2022-08-02 DIAGNOSIS — E1151 Type 2 diabetes mellitus with diabetic peripheral angiopathy without gangrene: Secondary | ICD-10-CM | POA: Diagnosis not present

## 2022-08-02 DIAGNOSIS — I739 Peripheral vascular disease, unspecified: Secondary | ICD-10-CM | POA: Diagnosis not present

## 2022-08-02 DIAGNOSIS — L84 Corns and callosities: Secondary | ICD-10-CM | POA: Diagnosis not present

## 2022-08-07 DIAGNOSIS — K529 Noninfective gastroenteritis and colitis, unspecified: Secondary | ICD-10-CM | POA: Diagnosis not present

## 2022-08-07 DIAGNOSIS — D126 Benign neoplasm of colon, unspecified: Secondary | ICD-10-CM | POA: Diagnosis not present

## 2022-08-14 NOTE — Progress Notes (Unsigned)
[facility] [address] [place]. [place] [address] [phone]       OFFICE FOLLOW-UP VISIT NOTE  [name] Date of Birth:  03/26/1955 Medical Record Number:  [id number]   Referring MD:   Marcy Siren  Reason for Referral: Memory loss  HPI:        Update 02/07/2022 : She returns for follow-up after last visit 4 months ago.  She is accompanied by her daughter.  Patient states that she is doing well.  Continues to have short-term memory difficulties which are unchanged.  He has poor short-term memory and often repeats herself and asked the same question she has not had any stroke or TIA symptoms.  She had lab work done at last visit vitamin B12, TSH, RPR and homocystine all normal.  Hemoglobin A1c was 6.0.  MRI scan of the brain on 10/27/2021 showed no acute abnormalities but old bilateral lacunar infarcts involving basal ganglia, corona radiata and cerebellum.  MR angiogram of the brain and neck both did not show large vessel stenosis or occlusion.  She tripped and had a fall and was seen in the ER on 12/23/2018.  CT scan of the head was obtained which showed no acute abnormalities.  She remains on aspirin 81 mg daily as well as Xarelto tolerating well with minor bruising and no bleeding.  Initial visit 10/06/2021 Dr. Pearlean Brownie: [name] is a 67 year old pleasant African-American lady seen today for office consultation visit for memory loss.  She is accompanied by her daughter.  History is obtained from them and review of referral notes and electronic medical records and opossum reviewed pertinent available imaging films in PACS.  She has past medical history of diabetes, pretension, hyperlipidemia, osteoarthritis and right medullary infarct in November 2020.  The patient has developed some memory and cognitive difficulties since December of last year.  She has had intermittent confusion and disorientation off-and-on.  She is challenged with learning any new technology.  For  example she tried to order a meal through [employer] app and could not figure out why it was not working.  Short-term memory is quite poor and she often needs information to be repeated multiple times.  She has been under increased stress for the last 1 year as she has lost her daughter, uncle and husband and may be in significant grief.  The daughter reports an episode in June this year when she was found to be staring into space with a glazed look on her face.  This was transient and she recovered quickly.  She saw her primary care physician who actually referred her to the ER where actually she was found to have a DVT in the legs and started on Xarelto which she is still taking.  She was started on Aricept but she has not been consistently taking it as per the daughter.  She was last seen in the office on 05/21/2019 by Shanda Bumps nurse practitioner.  Since then she had a CT scan of the head on 07/29/2021 which shows mild generalized atrophy and changes of small vessel disease.  Old bilateral basal ganglia lacunar infarcts are noted.  No acute abnormalities noted.  She had echocardiogram in 07/30/2021 which showed ejection fraction of 60 to 65%.  LDL cholesterol on 07/30/2021 was 32 mg percent.  She remains on aspirin 81 mg which is tolerating well without bleeding or bruising.  She is also tolerating Lipitor 40 mg well without any side effects.  She remains on  Glucophage for diabetes which appears well controlled.  She is now on Balto and is having minor bruising but no bleeding episodes.  On Mini-Mental status exam today she scored 28/30 with only deficits in recall.  On geriatric depression scale she was not depressed with a score of only 1.     ROS:   14 system review of systems is positive for memory loss, confusion, disorientation and all other systems negative  PMH:  Past Medical History:  Diagnosis Date   Hypertension    Osteoarthritis    Stroke [facility])     Social History:  Social  History   Socioeconomic History   Marital status: Single    Spouse name: Not on file   Number of children: Not on file   Years of education: Not on file   Highest education level: Some college, no degree  Occupational History   Not on file  Tobacco Use   Smoking status: Every Day    Current packs/day: 0.00    Average packs/day: 0.8 packs/day for 40.0 years (30.0 ttl pk-yrs)    Types: Cigarettes    Start date: 12/30/1978    Last attempt to quit: 12/30/2018    Years since quitting: 3.6    Passive exposure: Never   Smokeless tobacco: Never  Vaping Use   Vaping status: Never Used  Substance and Sexual Activity   Alcohol use: No   Drug use: No   Sexual activity: Not on file  Other Topics Concern   Not on file  Social History Narrative   Lives with her children   Social Determinants of Health   Financial Resource Strain: Low Risk  (04/30/2022)   Overall Financial Resource Strain (CARDIA)    Difficulty of Paying Living Expenses: Not hard at all  Food Insecurity: No Food Insecurity (04/30/2022)   Hunger Vital Sign    Worried About Running Out of Food in the Last Year: Never true    Ran Out of Food in the Last Year: Never true  Transportation Needs: No Transportation Needs (04/30/2022)   PRAPARE - Administrator, Civil Service (Medical): No    Lack of Transportation (Non-Medical): No  Physical Activity: Unknown (04/30/2022)   Exercise Vital Sign    Days of Exercise per Week: 0 days    Minutes of Exercise per Session: Not on file  Stress: No Stress Concern Present (04/30/2022)   [employer] of Occupational Health - Occupational Stress Questionnaire    Feeling of Stress : Not at all  Social Connections: Socially Isolated (04/30/2022)   Social Connection and Isolation Panel [NHANES]    Frequency of Communication with Friends and Family: Twice a week    Frequency of Social Gatherings with Friends and Family: Once a week    Attends Religious Services: Never     Database administrator or Organizations: No    Attends Engineer, structural: Not on file    Marital Status: Never married  Intimate Partner Violence: Not on file    Medications:   Current Outpatient Medications on File Prior to Visit  Medication Sig Dispense Refill   aspirin EC 81 MG tablet Take 1 tablet (81 mg total) by mouth daily. Swallow whole. 90 tablet 1   atorvastatin (LIPITOR) 40 MG tablet 1 TABLET AT 6 PM ORALLY ONCE A DAY 90 DAYS 90 tablet 0   dapagliflozin propanediol (FARXIGA) 10 MG TABS tablet Take 1 tablet (10 mg total) by mouth daily before breakfast. 90 tablet 1  lisinopril (ZESTRIL) 20 MG tablet Take 1 tablet (20 mg total) by mouth daily. 90 tablet 0   metFORMIN (GLUCOPHAGE) 500 MG tablet Take 1 tablet (500 mg total) by mouth daily with breakfast. 90 tablet 0   nebivolol (BYSTOLIC) 5 MG tablet TAKE 1 TABLET (5 MG TOTAL) BY MOUTH DAILY. 90 tablet 0   rivaroxaban (XARELTO) 2.5 MG TABS tablet Take 1 tablet (2.5 mg total) by mouth 2 (two) times daily. 180 tablet 1   spironolactone (ALDACTONE) 25 MG tablet TAKE 1 TABLET (25 MG TOTAL) BY MOUTH DAILY. 90 tablet 0   No current facility-administered medications on file prior to visit.    Allergies:  No Known Allergies  Physical Exam General: well developed, well nourished, pleasant middle-aged African-American lady seated, in no evident distress Head: head normocephalic and atraumatic.   Neck: supple with no carotid or supraclavicular bruits Cardiovascular: regular rate and rhythm, no murmurs Musculoskeletal: no deformity Skin:  no rash/petichiae Vascular:  Normal pulses all extremities  Neurologic Exam Mental Status: Awake and fully alert. Oriented to place and time. Recent and remote memory intact. Attention span, concentration and fund of knowledge appropriate. Mood and affect appropriate.  Diminished recall 1/3.  Able to name only 7 animals which can walk on 4 legs.  Clock drawing 4/4.  Mini-Mental status exam  score 28/30. Cranial Nerves: Fundoscopic exam reveals sharp disc margins. Pupils equal, briskly reactive to light. Extraocular movements full without nystagmus. Visual fields full to confrontation. Hearing intact. Facial sensation intact. Face, tongue, palate moves normally and symmetrically.  Motor: Normal bulk and tone. Normal strength in all tested extremity muscles. Sensory.: intact to touch , pinprick , position and vibratory sensation.  Coordination: Rapid alternating movements normal in all extremities. Finger-to-nose and heel-to-shin performed accurately bilaterally. Gait and Station: Arises from chair without difficulty. Stance is normal. Gait demonstrates normal stride length and balance . Able to heel, toe and tandem walk without difficulty.  Reflexes: 1+ and symmetric. Toes downgoing.   NIHSS  0 Modified Rankin  1    10/06/2021    8:17 AM  MMSE - Mini Mental State Exam  Orientation to time 5  Orientation to Place 5  Registration 3  Attention/ Calculation 5  Recall 1  Language- name 2 objects 2  Language- repeat 1  Language- follow 3 step command 3  Language- read & follow direction 1  Write a sentence 1  Copy design 1  Total score 28     ASSESSMENT: 67 year old African-American lady with progressive memory and cognitive difficulties likely due to mild cognitive impairment from multiple prior repair of strokes starting around 01/2021.  Prior history of right lateral medullary infarct in November 2020 due to small vessel disease with multiple vascular risk factors of diabetes, hypertension, hyperlipidemia, smoking and cerebrovascular disease.    1.  Memory loss  -MMSE ***/30 (prior 28/30) -Increase participation in cognitively challenging activities  -Discussed memory compensation strategies  2. Hx of stroke  -Continue aspirin and atorvastatin for stroke prevention and Xarelto for DVT managed/monitored by PCP  -Continue close PCP follow-up for aggressive stroke risk  factor management     I spent *** minutes of face-to-face and non-face-to-face time with patient.  This included previsit chart review, lab review, study review, order entry, electronic health record documentation, patient education and discussion regarding above diagnoses and treatment plan and answered all other questions to patient's satisfaction  Ihor Austin, [facility]  [facility] [address] [place], [place] [address]  Phone [phone] Fax [phone] Note: This document was prepared with digital dictation and possible smart phrase technology. Any transcriptional errors that result from this process are unintentional.

## 2022-08-15 ENCOUNTER — Encounter: Payer: Self-pay | Admitting: Adult Health

## 2022-08-15 ENCOUNTER — Ambulatory Visit (INDEPENDENT_AMBULATORY_CARE_PROVIDER_SITE_OTHER): Payer: 59 | Admitting: Adult Health

## 2022-08-15 VITALS — BP 104/73 | HR 75 | Ht 63.0 in | Wt 170.0 lb

## 2022-08-15 DIAGNOSIS — R413 Other amnesia: Secondary | ICD-10-CM | POA: Diagnosis not present

## 2022-08-15 DIAGNOSIS — Z8673 Personal history of transient ischemic attack (TIA), and cerebral infarction without residual deficits: Secondary | ICD-10-CM

## 2022-08-15 NOTE — Patient Instructions (Addendum)
Your Plan:  No changes today - continue to follow with Dr. Yetta Barre for stroke risk factor management  If cognition starts to decline, can consider use of Aricept or Namenda to help slow decline     Follow up as needed at this time       Thank you for coming to see [place] at [facility]. I hope we have been able to provide you high quality care today.  You may receive a patient satisfaction survey over the next few weeks. We would appreciate your feedback and comments so that we may continue to improve ourselves and the health of our patients.    Mild Neurocognitive Disorder Mild neurocognitive disorder, formerly known as mild cognitive impairment, is a disorder in which memory does not work as well as it should. This disorder may also cause problems with other mental functions, including thought, communication, behavior, and completion of tasks. These problems can be noticed and measured, but they usually do not interfere with daily activities or the ability to live independently. Mild neurocognitive disorder typically develops after 68 years of age, but it can also develop at younger ages. It is not as serious as major neurocognitive disorder, also known as dementia, but it may be the first sign of it. Generally, symptoms of this condition get worse over time. In rare cases, symptoms can get better. What are the causes? This condition may be caused by: Brain disorders like Alzheimer's disease, Parkinson's disease, and other conditions that gradually damage nerve cells (neurodegenerative conditions). Diseases that affect blood vessels in the brain and result in small strokes. Certain infections, such as HIV. Traumatic brain injury. Other medical conditions, such as brain tumors, underactive thyroid (hypothyroidism), and vitamin B12 deficiency. Use of certain drugs or prescription medicines. What increases the risk? The following factors may make you more likely to develop  this condition: Being older than 65 years. Being female. Low education level. Diabetes, high blood pressure, high cholesterol, and other conditions that increase the risk for blood vessel diseases. Untreated or undertreated sleep apnea. Having a certain type of gene that can be passed from parent to child (inherited). Chronic health problems such as heart disease, lung disease, liver disease, kidney disease, or depression. What are the signs or symptoms? Symptoms of this condition include: Difficulty remembering. You may: Forget names, phone numbers, or details of recent events. Forget social events and appointments. Repeatedly forget where you put your car keys or other items. Difficulty thinking and solving problems. You may have trouble with complex tasks, such as: Paying bills. Driving in unfamiliar places. Difficulty communicating. You may have trouble: Finding the right word or naming an object. Forming a sentence that makes sense, or understanding what you read or hear. Changes in your behavior or personality. When this happens, you may: Lose interest in the things that you used to enjoy. Withdraw from social situations. Get angry more easily than usual. Act before thinking. How is this diagnosed? This condition is diagnosed based on: Your symptoms. Your health care provider may ask you and the people you spend time with, such as family and friends, about your symptoms. Evaluation of mental functions (neuropsychological testing). Your health care provider may refer you to a neurologist or mental health specialist to evaluate your mental functions in detail. To identify the cause of your condition, your health care provider may: Get a detailed medical history. Ask about use of alcohol, drugs, and prescription medicines. Do a physical exam. Order blood tests and brain  imaging exams. How is this treated? Mild neurocognitive disorder that is caused by medicine use, drug use,  infection, or another medical condition may improve when the cause is treated, or when medicines or drugs are stopped. If this disorder has another cause, it generally does not improve and may get worse. In these cases, the goal of treatment is to help you manage the loss of mental function. Treatments in these cases include: Medicine. Medicine mainly helps memory and behavior symptoms. Talk therapy. Talk therapy provides education, emotional support, memory aids, and other ways of making up for problems with mental function. Lifestyle changes, including: Getting regular exercise. Eating a healthy diet that includes omega-3 fatty acids. Challenging your thinking and memory skills. Having more social interaction. Follow these instructions at home: Eating and drinking  Drink enough fluid to keep your urine pale yellow. Eat a healthy diet that includes omega-3 fatty acids. These can be found in: Fish. Nuts. Leafy vegetables. Vegetable oils. If you drink alcohol: Limit how much you use to: 0-1 drink a day for women. 0-2 drinks a day for men. Be aware of how much alcohol is in your drink. In the [place] one drink equals one 12 oz bottle of beer (355 mL), one 5 oz glass of wine (148 mL), or one 1 oz glass of hard liquor (44 mL). Lifestyle  Get regular exercise as told by your health care provider. Do not use any products that contain nicotine or tobacco, such as cigarettes, e-cigarettes, and chewing tobacco. If you need help quitting, ask your health care provider. Practice ways to manage stress. If you need help managing stress, ask your health care provider. Continue to have social interaction. Keep your mind active with stimulating activities you enjoy, such as reading or playing games. Make sure to get quality sleep. Follow these tips: Avoid napping during the day. Keep your sleeping area dark and cool. Avoid exercising during the few hours before you go to bed. Avoid caffeine products  in the evening. General instructions Take over-the-counter and prescription medicines only as told by your health care provider. Your health care provider may recommend that you avoid taking medicines that can affect thinking, such as pain medicines or sleep medicines. Work with your health care provider to find out what you need help with and what your safety needs are. Keep all follow-up visits. This is important. Where to find more information [employer] on Aging: https://walker.com/ Contact a health care provider if: You have any new symptoms. Get help right away if: You develop new confusion or your confusion gets worse. You act in ways that place you or your family in danger. Summary Mild neurocognitive disorder is a disorder in which memory does not work as well as it should. Mild neurocognitive disorder can have many causes. It may be the first stage of dementia. To manage your condition, get regular exercise, keep your mind active, get quality sleep, and eat a healthy diet. This information is not intended to replace advice given to you by your health care provider. Make sure you discuss any questions you have with your health care provider. Document Revised: 06/02/2019 Document Reviewed: 06/02/2019 Elsevier Patient Education  2024 [employer].

## 2022-09-05 ENCOUNTER — Ambulatory Visit (HOSPITAL_COMMUNITY)
Admission: RE | Admit: 2022-09-05 | Discharge: 2022-09-05 | Disposition: A | Discharge status: Home / Self Care | Payer: 59 | Source: Ambulatory Visit | Attending: Vascular Surgery | Admitting: Vascular Surgery

## 2022-09-05 ENCOUNTER — Ambulatory Visit (INDEPENDENT_AMBULATORY_CARE_PROVIDER_SITE_OTHER): Payer: 59 | Admitting: Physician Assistant

## 2022-09-05 VITALS — BP 137/83 | HR 65 | Temp 98.6°F | Resp 18 | Ht 63.0 in | Wt 178.3 lb

## 2022-09-05 DIAGNOSIS — I739 Peripheral vascular disease, unspecified: Secondary | ICD-10-CM | POA: Diagnosis not present

## 2022-09-05 LAB — VAS US ABI WITH/WO TBI
Left ABI: 0.47
Right ABI: 0.51

## 2022-09-05 NOTE — Progress Notes (Signed)
Office Note   History of Present Illness   Ashley Price is a 67 y.o. (January 12, 1956) female who presents for surveillance of PAD.  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  She underwent LLE angiogram, central venogram, pulmonary angiogram, and mechanical thrombectomy of IVC, left CIV, EIV, CFV, FV on 07/29/2021 by Dr. Lenell Antu.  She received good technical results from mechanical thrombectomy and was started on a 65-month course of Xarelto.  She has a history of PAD without interventions.  At her follow-up today she is completely finished with her course of Xarelto.  She states her PCP wants her to be on a daily aspirin and blood thinner.  She denies any issues with leg swelling, claudication, rest pain, or tissue loss.  She walks very minimally, just usually around her house.  Current Outpatient Medications  Medication Sig Dispense Refill   aspirin EC 81 MG tablet Take 1 tablet (81 mg total) by mouth daily. Swallow whole. 90 tablet 1   atorvastatin (LIPITOR) 40 MG tablet 1 TABLET AT 6 PM ORALLY ONCE A DAY 90 DAYS 90 tablet 0   Cyanocobalamin (B-12 PO) Take 1 tablet by mouth daily.     dapagliflozin propanediol (FARXIGA) 10 MG TABS tablet Take 1 tablet (10 mg total) by mouth daily before breakfast. 90 tablet 1   lisinopril (ZESTRIL) 20 MG tablet Take 1 tablet (20 mg total) by mouth daily. 90 tablet 0   metFORMIN (GLUCOPHAGE) 500 MG tablet Take 1 tablet (500 mg total) by mouth daily with breakfast. 90 tablet 0   spironolactone (ALDACTONE) 25 MG tablet TAKE 1 TABLET (25 MG TOTAL) BY MOUTH DAILY. 90 tablet 0   UNKNOWN TO PATIENT Take 1 tablet by mouth daily. 24 hour allergy medication     rivaroxaban (XARELTO) 2.5 MG TABS tablet Take 1 tablet (2.5 mg total) by mouth 2 (two) times daily. (Patient not taking: Reported on 09/05/2022) 180 tablet 1   No current facility-administered medications for this visit.    REVIEW OF SYSTEMS (negative unless  checked):   Cardiac:  []  Chest pain or chest pressure? []  Shortness of breath upon activity? []  Shortness of breath when lying flat? []  Irregular heart rhythm?  Vascular:  []  Pain in calf, thigh, or hip brought on by walking? []  Pain in feet at night that wakes you up from your sleep? []  Blood clot in your veins? []  Leg swelling?  Pulmonary:  []  Oxygen at home? []  Productive cough? []  Wheezing?  Neurologic:  []  Sudden weakness in arms or legs? []  Sudden numbness in arms or legs? []  Sudden onset of difficult speaking or slurred speech? []  Temporary loss of vision in one eye? []  Problems with dizziness?  Gastrointestinal:  []  Blood in stool? []  Vomited blood?  Genitourinary:  []  Burning when urinating? []  Blood in urine?  Psychiatric:  []  Major depression  Hematologic:  []  Bleeding problems? []  Problems with blood clotting?  Dermatologic:  []  Rashes or ulcers?  Constitutional:  []  Fever or chills?  Ear/Nose/Throat:  []  Change in hearing? []  Nose bleeds? []  Sore throat?  Musculoskeletal:  []  Back pain? []  Joint pain? []  Muscle pain?   Physical Examination   Vitals:   09/05/22 1405  BP: 137/83  Pulse: 65  Resp: 18  Temp: 98.6 F (37 C)  TempSrc: Temporal  SpO2: 92%  Weight: 178 lb 4.8 oz (80.9 kg)  Height: 5\' 3"  (1.6 m)   Body  mass index is 31.58 kg/m.  General:  WDWN in NAD; vital signs documented above Gait: Not observed HENT: WNL, normocephalic Pulmonary: normal non-labored breathing , without rales, rhonchi,  wheezing Cardiac: regular Abdomen: soft, NT, no masses Skin: without rashes Vascular Exam/Pulses: Monophasic DP/PT signals bilaterally Extremities: without ischemic changes, without gangrene , without cellulitis; without open wounds;  Musculoskeletal: no muscle wasting or atrophy  Neurologic: A&O X 3;  No focal weakness or paresthesias are detected Psychiatric:  The pt has Normal affect.  Non-Invasive Vascular imaging    ABI (09/05/2022) R:  ABI: 0.51 (0.64),  PT: mono DP: mono TBI:  0.22 L:  ABI: 0.47 (0.51),  PT: mono DP: mono TBI: 0.38   Medical Decision Making   Ashley Price is a 67 y.o. female who presents for surveillance of PAD  Based on the patient's vascular studies, her ABIs on the right are slightly decreased from 0.64 to 0.51.  Her ABIs on the left are essentially unchanged at 0.47 She has no prior arterial vascular intervention.  She has a history of PAD without claudication, rest pain, or tissue loss.  She currently does not ambulate much to claudicate On exam she has monophasic DP/PT Doppler signals bilaterally From a venous standpoint I do not believe the patient needs to be on a long-term blood thinner.  If her PCP would like to keep her on a daily aspirin and anticoagulant I do not see any issue with it. She can follow-up with our office in 1 year with ABIs   Loel Dubonnet PA-C [facility] Office: [phone]  Clinic MD: Lenell Antu

## 2022-09-13 ENCOUNTER — Other Ambulatory Visit: Payer: Self-pay

## 2022-09-13 DIAGNOSIS — I739 Peripheral vascular disease, unspecified: Secondary | ICD-10-CM

## 2022-09-26 ENCOUNTER — Other Ambulatory Visit: Payer: Self-pay | Admitting: Internal Medicine

## 2022-09-26 DIAGNOSIS — E1151 Type 2 diabetes mellitus with diabetic peripheral angiopathy without gangrene: Secondary | ICD-10-CM

## 2022-09-26 DIAGNOSIS — I739 Peripheral vascular disease, unspecified: Secondary | ICD-10-CM

## 2022-09-26 DIAGNOSIS — I10 Essential (primary) hypertension: Secondary | ICD-10-CM

## 2022-10-25 ENCOUNTER — Other Ambulatory Visit: Payer: Self-pay | Admitting: Internal Medicine

## 2022-10-25 DIAGNOSIS — E1151 Type 2 diabetes mellitus with diabetic peripheral angiopathy without gangrene: Secondary | ICD-10-CM

## 2022-10-26 ENCOUNTER — Other Ambulatory Visit: Payer: Self-pay | Admitting: Internal Medicine

## 2022-10-26 DIAGNOSIS — E785 Hyperlipidemia, unspecified: Secondary | ICD-10-CM

## 2022-10-27 ENCOUNTER — Other Ambulatory Visit: Payer: Self-pay | Admitting: Internal Medicine

## 2022-10-27 DIAGNOSIS — E785 Hyperlipidemia, unspecified: Secondary | ICD-10-CM

## 2022-11-01 ENCOUNTER — Other Ambulatory Visit: Payer: Self-pay | Admitting: Internal Medicine

## 2022-11-01 DIAGNOSIS — L603 Nail dystrophy: Secondary | ICD-10-CM | POA: Diagnosis not present

## 2022-11-01 DIAGNOSIS — L84 Corns and callosities: Secondary | ICD-10-CM | POA: Diagnosis not present

## 2022-11-01 DIAGNOSIS — E1151 Type 2 diabetes mellitus with diabetic peripheral angiopathy without gangrene: Secondary | ICD-10-CM

## 2022-11-01 DIAGNOSIS — N1831 Chronic kidney disease, stage 3a: Secondary | ICD-10-CM

## 2022-11-01 DIAGNOSIS — I739 Peripheral vascular disease, unspecified: Secondary | ICD-10-CM | POA: Diagnosis not present

## 2022-11-11 ENCOUNTER — Other Ambulatory Visit: Payer: Self-pay | Admitting: Internal Medicine

## 2022-11-11 DIAGNOSIS — E1151 Type 2 diabetes mellitus with diabetic peripheral angiopathy without gangrene: Secondary | ICD-10-CM

## 2022-11-21 ENCOUNTER — Ambulatory Visit (INDEPENDENT_AMBULATORY_CARE_PROVIDER_SITE_OTHER): Payer: Managed Care, Other (non HMO) | Admitting: Internal Medicine

## 2022-11-21 ENCOUNTER — Encounter: Payer: Self-pay | Admitting: Internal Medicine

## 2022-11-21 VITALS — BP 102/68 | HR 82 | Temp 98.1°F | Resp 16 | Ht 63.0 in | Wt 180.6 lb

## 2022-11-21 DIAGNOSIS — Z1159 Encounter for screening for other viral diseases: Secondary | ICD-10-CM

## 2022-11-21 DIAGNOSIS — N1831 Chronic kidney disease, stage 3a: Secondary | ICD-10-CM | POA: Diagnosis not present

## 2022-11-21 DIAGNOSIS — Z720 Tobacco use: Secondary | ICD-10-CM | POA: Insufficient documentation

## 2022-11-21 DIAGNOSIS — I739 Peripheral vascular disease, unspecified: Secondary | ICD-10-CM

## 2022-11-21 DIAGNOSIS — I10 Essential (primary) hypertension: Secondary | ICD-10-CM | POA: Diagnosis not present

## 2022-11-21 DIAGNOSIS — I950 Idiopathic hypotension: Secondary | ICD-10-CM | POA: Diagnosis not present

## 2022-11-21 DIAGNOSIS — R9431 Abnormal electrocardiogram [ECG] [EKG]: Secondary | ICD-10-CM

## 2022-11-21 DIAGNOSIS — Z794 Long term (current) use of insulin: Secondary | ICD-10-CM

## 2022-11-21 DIAGNOSIS — E1151 Type 2 diabetes mellitus with diabetic peripheral angiopathy without gangrene: Secondary | ICD-10-CM

## 2022-11-21 DIAGNOSIS — E1110 Type 2 diabetes mellitus with ketoacidosis without coma: Secondary | ICD-10-CM | POA: Diagnosis not present

## 2022-11-21 LAB — CBC WITH DIFFERENTIAL/PLATELET
Basophils Absolute: 0.2 10*3/uL — ABNORMAL HIGH (ref 0.0–0.1)
Basophils Relative: 1.3 % (ref 0.0–3.0)
Eosinophils Absolute: 0.6 10*3/uL (ref 0.0–0.7)
Eosinophils Relative: 3.7 % (ref 0.0–5.0)
HCT: 40.6 % (ref 36.0–46.0)
Hemoglobin: 13.3 g/dL (ref 12.0–15.0)
Lymphocytes Relative: 28.9 % (ref 12.0–46.0)
Lymphs Abs: 4.4 10*3/uL — ABNORMAL HIGH (ref 0.7–4.0)
MCHC: 32.7 g/dL (ref 30.0–36.0)
MCV: 90.2 fL (ref 78.0–100.0)
Monocytes Absolute: 1.1 10*3/uL — ABNORMAL HIGH (ref 0.1–1.0)
Monocytes Relative: 7 % (ref 3.0–12.0)
Neutro Abs: 9.1 10*3/uL — ABNORMAL HIGH (ref 1.4–7.7)
Neutrophils Relative %: 59.1 % (ref 43.0–77.0)
Platelets: 339 10*3/uL (ref 150.0–400.0)
RBC: 4.5 Mil/uL (ref 3.87–5.11)
RDW: 15.4 % (ref 11.5–15.5)
WBC: 15.3 10*3/uL — ABNORMAL HIGH (ref 4.0–10.5)

## 2022-11-21 LAB — BASIC METABOLIC PANEL
BUN: 12 mg/dL (ref 6–23)
CO2: 18 meq/L — ABNORMAL LOW (ref 19–32)
Calcium: 10 mg/dL (ref 8.4–10.5)
Chloride: 103 meq/L (ref 96–112)
Creatinine, Ser: 0.91 mg/dL (ref 0.40–1.20)
GFR: 64.77 mL/min (ref >60.00)
Glucose, Bld: 241 mg/dL — ABNORMAL HIGH (ref 70–99)
Potassium: 3.8 meq/L (ref 3.5–5.1)
Sodium: 138 meq/L (ref 135–145)

## 2022-11-21 LAB — HEMOGLOBIN A1C: Hgb A1c MFr Bld: 9.3 % — ABNORMAL HIGH (ref 4.6–6.5)

## 2022-11-21 LAB — BRAIN NATRIURETIC PEPTIDE: Pro B Natriuretic peptide (BNP): 24 pg/mL (ref 0.0–100.0)

## 2022-11-21 LAB — TROPONIN I (HIGH SENSITIVITY): High Sens Troponin I: 5 ng/L (ref 2–17)

## 2022-11-21 LAB — CORTISOL: Cortisol, Plasma: 12.8 ug/dL

## 2022-11-21 NOTE — Patient Instructions (Signed)

## 2022-11-21 NOTE — Progress Notes (Unsigned)
Subjective:  Patient ID: Ashley Price, female    DOB: [birth year]  Age: 67 y.o. MRN: [id number]  CC: Hypertension and Diabetes   HPI Ashley Price presents for f/up ---  Discussed the use of AI scribe software for clinical note transcription with the patient, who gave verbal consent to proceed.  History of Present Illness   The patient presents with no new complaints, denying any chest pain, shortness of breath, dizziness, or lightheadedness. She also reports no issues with blood sugar regulation. She denies any side effects from her current medications, including muscle aches, joint aches, abdominal pain, numbness, or tingling. The patient does not typically receive vaccinations, including the flu shot, shingles, or pneumonia vaccines. She reports running out of some of her medications. She is with a caretaker today who reported she smoked a pack of cigarettes the day prior to this appt.       Outpatient Medications Prior to Visit  Medication Sig Dispense Refill   aspirin EC 81 MG tablet Take 1 tablet (81 mg total) by mouth daily. Swallow whole. 90 tablet 1   atorvastatin (LIPITOR) 40 MG tablet 1 TABLET AT 6 PM ORALLY ONCE A DAY 90 DAYS 90 tablet 0   Cyanocobalamin (B-12 PO) Take 1 tablet by mouth daily.     spironolactone (ALDACTONE) 25 MG tablet TAKE 1 TABLET (25 MG TOTAL) BY MOUTH DAILY. 90 tablet 0   lisinopril (ZESTRIL) 20 MG tablet TAKE 1 TABLET BY MOUTH EVERY DAY 90 tablet 0   dapagliflozin propanediol (FARXIGA) 10 MG TABS tablet Take 1 tablet (10 mg total) by mouth daily before breakfast. (Patient not taking: Reported on 11/21/2022) 90 tablet 1   metFORMIN (GLUCOPHAGE) 500 MG tablet Take 1 tablet (500 mg total) by mouth daily with breakfast. (Patient not taking: Reported on 11/21/2022) 90 tablet 0   rivaroxaban (XARELTO) 2.5 MG TABS tablet Take 1 tablet (2.5 mg total) by mouth 2 (two) times daily. (Patient not taking: Reported on 09/05/2022) 180 tablet 1   UNKNOWN TO PATIENT  Take 1 tablet by mouth daily. 24 hour allergy medication (Patient not taking: Reported on 11/21/2022)     No facility-administered medications prior to visit.    ROS Review of Systems  Psychiatric/Behavioral:  Positive for confusion and decreased concentration. The patient is not nervous/anxious.     Objective:  BP 102/68   Pulse 82   Temp 98.1 F (36.7 C) (Oral)   Resp 16   Ht 5\' 3"  (1.6 m)   Wt 180 lb 9.6 oz (81.9 kg)   SpO2 99%   BMI 31.99 kg/m   BP Readings from Last 3 Encounters:  11/21/22 102/68  09/05/22 137/83  08/15/22 104/73    Wt Readings from Last 3 Encounters:  11/21/22 180 lb 9.6 oz (81.9 kg)  09/05/22 178 lb 4.8 oz (80.9 kg)  08/15/22 170 lb (77.1 kg)    Physical Exam Vitals reviewed.  Constitutional:      Appearance: Normal appearance. She is not ill-appearing.  HENT:     Mouth/Throat:     Mouth: Mucous membranes are moist.  Eyes:     General: No scleral icterus.    Pupils: Pupils are equal, round, and reactive to light.  Cardiovascular:     Rate and Rhythm: Normal rate and regular rhythm.     Heart sounds: Normal heart sounds, S1 normal and S2 normal. No murmur heard.    No friction rub. No gallop.     Comments:  EKG- NSR, 74 bpm LAD Inferior infarct pattern is new No LVH or acute ST/T wave changes Pulmonary:     Effort: Pulmonary effort is normal.     Breath sounds: No stridor. No wheezing, rhonchi or rales.  Abdominal:     Palpations: There is no mass.     Tenderness: There is no abdominal tenderness. There is no guarding.     Hernia: No hernia is present.  Musculoskeletal:     Cervical back: Neck supple.     Right lower leg: No edema.     Left lower leg: No edema.  Lymphadenopathy:     Cervical: No cervical adenopathy.  Skin:    General: Skin is warm.  Neurological:     General: No focal deficit present.     Mental Status: She is alert. Mental status is at baseline.  Psychiatric:        Attention and Perception: She is  inattentive.        Mood and Affect: Mood normal.        Speech: Speech is delayed and tangential.        Behavior: Behavior is slowed.        Thought Content: Thought content normal.        Cognition and Memory: Cognition normal.     Lab Results  Component Value Date   WBC 13.1 (H) 05/03/2022   HGB 14.1 05/03/2022   HCT 42.6 05/03/2022   PLT 272.0 05/03/2022   GLUCOSE 121 (H) 05/03/2022   CHOL 102 05/03/2022   TRIG 90.0 05/03/2022   HDL 42.50 05/03/2022   LDLCALC 42 05/03/2022   ALT 11 05/03/2022   AST 13 05/03/2022   NA 139 05/03/2022   K 4.0 05/03/2022   CL 105 05/03/2022   CREATININE 1.01 05/03/2022   BUN 16 05/03/2022   CO2 25 05/03/2022   TSH 2.510 10/06/2021   INR 1.2 07/29/2021   HGBA1C 6.7 (H) 05/03/2022   MICROALBUR 2.7 (H) 11/08/2021    VAS [place] ABI WITH/WO TBI  Result Date: 09/06/2022  LOWER EXTREMITY DOPPLER STUDY Patient Name:  Ashley Price  Date of Exam:   09/05/2022 Medical Rec #: [id number]       Accession #:    [id number] Date of Birth: May 06, 1955        Patient Gender: F Patient Age:   68 years Exam Location:  [facility] Procedure:      VAS [place] ABI WITH/WO TBI Referring Phys: --------------------------------------------------------------------------------  Indications: Peripheral artery disease. High Risk Factors: Hypertension, Diabetes.  Performing Technologist: Dorthula Matas RVS, RCS  Examination Guidelines: A complete evaluation includes at minimum, Doppler waveform signals and systolic blood pressure reading at the level of bilateral brachial, anterior tibial, and posterior tibial arteries, when vessel segments are accessible. Bilateral testing is considered an integral part of a complete examination. Photoelectric Plethysmograph (PPG) waveforms and toe systolic pressure readings are included as required and additional duplex testing as needed. Limited examinations for reoccurring indications may be performed as noted.  ABI Findings:  +---------+------------------+-----+----------+--------+ Right    Rt Pressure (mmHg)IndexWaveform  Comment  +---------+------------------+-----+----------+--------+ Brachial 130                                       +---------+------------------+-----+----------+--------+ PTA      68                0.51 monophasic         +---------+------------------+-----+----------+--------+  DP       52                0.39 monophasic         +---------+------------------+-----+----------+--------+ Great Toe29                0.22                    +---------+------------------+-----+----------+--------+ +---------+------------------+-----+----------+-------+ Left     Lt Pressure (mmHg)IndexWaveform  Comment +---------+------------------+-----+----------+-------+ Brachial 133                                      +---------+------------------+-----+----------+-------+ PTA      63                0.47 monophasic        +---------+------------------+-----+----------+-------+ DP       34                0.26 monophasic        +---------+------------------+-----+----------+-------+ Great Toe51                0.38                   +---------+------------------+-----+----------+-------+ +-------+-----------+-----------+------------+------------+ ABI/TBIToday's ABIToday's TBIPrevious ABIPrevious TBI +-------+-----------+-----------+------------+------------+ Right  0.51       0.22       0.64        0.47         +-------+-----------+-----------+------------+------------+ Left   0.47       0.38       0.51        0.46         +-------+-----------+-----------+------------+------------+ Bilateral ABIs appear decreased.  Summary: Right: Resting right ankle-brachial index indicates moderate right lower extremity arterial disease. The right toe-brachial index is abnormal. Left: Resting left ankle-brachial index indicates severe left lower extremity arterial disease. The  left toe-brachial index is abnormal. *See table(s) above for measurements and observations.  Electronically signed by Heath Lark on 09/06/2022 at 4:12:39 PM.    Final     Assessment & Plan:  Need for hepatitis C screening test -     Hepatitis C antibody; Future  Essential hypertension -     Urinalysis, Routine w reflex microscopic; Future -     CBC with Differential/Platelet; Future -     Basic metabolic panel; Future  Type 2 diabetes mellitus with diabetic peripheral angiopathy without gangrene, without long-term current use of insulin (HCC) -     Urinalysis, Routine w reflex microscopic; Future -     Hemoglobin A1c; Future -     Basic metabolic panel; Future -     Microalbumin / creatinine urine ratio; Future -     HM Diabetes Foot Exam -     AMB Referral VBCI Care Management  Stage 3a chronic kidney disease (HCC) -     Urinalysis, Routine w reflex microscopic; Future -     Basic metabolic panel; Future -     Microalbumin / creatinine urine ratio; Future  Idiopathic hypotension -     Cortisol; Future -     AMB Referral VBCI Care Management  Peripheral vascular disease (HCC)  Tobacco abuse  Abnormal electrocardiogram (ECG) (EKG) -     ECHOCARDIOGRAM COMPLETE; Future -     Troponin I (High Sensitivity); Future -     Brain natriuretic peptide; Future  Follow-up: Return in about 3 months (around 02/21/2023).  Sanda Linger, MD

## 2022-11-22 ENCOUNTER — Emergency Department (HOSPITAL_COMMUNITY)
Admission: EM | Admit: 2022-11-22 | Discharge: 2022-11-22 | Disposition: A | Discharge status: Home / Self Care | Payer: 59 | Attending: Emergency Medicine | Admitting: Emergency Medicine

## 2022-11-22 ENCOUNTER — Other Ambulatory Visit: Payer: Self-pay

## 2022-11-22 ENCOUNTER — Encounter: Payer: Self-pay | Admitting: Internal Medicine

## 2022-11-22 DIAGNOSIS — E1110 Type 2 diabetes mellitus with ketoacidosis without coma: Secondary | ICD-10-CM | POA: Insufficient documentation

## 2022-11-22 DIAGNOSIS — Z7984 Long term (current) use of oral hypoglycemic drugs: Secondary | ICD-10-CM | POA: Diagnosis not present

## 2022-11-22 DIAGNOSIS — Z7982 Long term (current) use of aspirin: Secondary | ICD-10-CM | POA: Diagnosis not present

## 2022-11-22 DIAGNOSIS — R739 Hyperglycemia, unspecified: Secondary | ICD-10-CM | POA: Diagnosis present

## 2022-11-22 DIAGNOSIS — E1165 Type 2 diabetes mellitus with hyperglycemia: Secondary | ICD-10-CM | POA: Diagnosis not present

## 2022-11-22 LAB — I-STAT VENOUS BLOOD GAS, ED
Acid-base deficit: 2 mmol/L (ref 0.0–2.0)
Bicarbonate: 24.1 mmol/L (ref 20.0–28.0)
Calcium, Ion: 1.27 mmol/L (ref 1.15–1.40)
HCT: 38 % (ref 36.0–46.0)
Hemoglobin: 12.9 g/dL (ref 12.0–15.0)
O2 Saturation: 88 %
Potassium: 4 mmol/L (ref 3.5–5.1)
Sodium: 137 mmol/L (ref 135–145)
TCO2: 25 mmol/L (ref 22–32)
pCO2, Ven: 47.2 mm[Hg] (ref 44–60)
pH, Ven: 7.315 (ref 7.25–7.43)
pO2, Ven: 60 mm[Hg] — ABNORMAL HIGH (ref 32–45)

## 2022-11-22 LAB — HEPATITIS C ANTIBODY: Hepatitis C Ab: NONREACTIVE

## 2022-11-22 LAB — CBC
HCT: 36.6 % (ref 36.0–46.0)
Hemoglobin: 12 g/dL (ref 12.0–15.0)
MCH: 29.1 pg (ref 26.0–34.0)
MCHC: 32.8 g/dL (ref 30.0–36.0)
MCV: 88.6 fL (ref 80.0–100.0)
Platelets: 295 10*3/uL (ref 150–400)
RBC: 4.13 MIL/uL (ref 3.87–5.11)
RDW: 15.3 % (ref 11.5–15.5)
WBC: 13.7 10*3/uL — ABNORMAL HIGH (ref 4.0–10.5)
nRBC: 0 % (ref 0.0–0.2)

## 2022-11-22 LAB — I-STAT CHEM 8, ED
BUN: 12 mg/dL (ref 8–23)
Calcium, Ion: 1.29 mmol/L (ref 1.15–1.40)
Chloride: 102 mmol/L (ref 98–111)
Creatinine, Ser: 0.8 mg/dL (ref 0.44–1.00)
Glucose, Bld: 296 mg/dL — ABNORMAL HIGH (ref 70–99)
HCT: 38 % (ref 36.0–46.0)
Hemoglobin: 12.9 g/dL (ref 12.0–15.0)
Potassium: 4.1 mmol/L (ref 3.5–5.1)
Sodium: 137 mmol/L (ref 135–145)
TCO2: 23 mmol/L (ref 22–32)

## 2022-11-22 LAB — TROPONIN I (HIGH SENSITIVITY): Troponin I (High Sensitivity): 7 ng/L (ref <18)

## 2022-11-22 MED ORDER — METFORMIN HCL 500 MG PO TABS: 500.0000 mg | ORAL_TABLET | Freq: Two times a day (BID) | ORAL | 0 refills | Status: DC

## 2022-11-22 NOTE — ED Triage Notes (Signed)
Daughter stated, she went to Dr. Sanda Linger yesterday and stated, I think you have had a heart attack and someone will get in touch with you to get an echo cardiogram.  Pt has been out of her Metformin for 1 week and half but her Dr. Did not refill her Metformin , he was more concerned with her heart at the time. She is here today because her glucose was 542, and yesterday at Dr's office it was 241. Im not sure if he called in the Metformin or not.The Dr. Did not address the issue of her diabetes yesterday. Marland Kitchen

## 2022-11-22 NOTE — ED Provider Notes (Signed)
[place] [facility] AT [facility] Provider Note   CSN: [id number] Arrival date & time: 11/22/22  1034     History  Chief Complaint  Patient presents with   Hyperglycemia    Ashley Price is a 67 y.o. female.  67 year old female with history of type 2 diabetes presents with increasing blood sugars.  Patient states that she has been out of her metformin now for the past week or so.  She does not follow a diabetic diet.  Denies any polyuria or dyspnea.  Seen by her physician for similar symptoms and has not received a refill of her metformin.  She was taking metformin from the milligrams twice daily.  She denies any weakness.  Patient had EKG in the office which show possible MI in the past however patient has not had any recent chest pain or chest pressure.  She denies any other cardiac symptoms at this time.  She otherwise feels at her baseline state of health       Home Medications Prior to Admission medications   Medication Sig Start Date End Date Taking? Authorizing Provider  aspirin EC 81 MG tablet Take 1 tablet (81 mg total) by mouth daily. Swallow whole. 05/03/22   [name], MD  atorvastatin (LIPITOR) 40 MG tablet 1 TABLET AT 6 PM ORALLY ONCE A DAY 90 DAYS 10/26/22   [name], MD  Cyanocobalamin (B-12 PO) Take 1 tablet by mouth daily.    [provider]  dapagliflozin propanediol (FARXIGA) 10 MG TABS tablet Take 1 tablet (10 mg total) by mouth daily before breakfast. Patient not taking: Reported on 11/21/2022 05/03/22   [name], MD  metFORMIN (GLUCOPHAGE) 500 MG tablet Take 1 tablet (500 mg total) by mouth daily with breakfast. Patient not taking: Reported on 11/21/2022 06/16/22   [name], MD  rivaroxaban (XARELTO) 2.5 MG TABS tablet Take 1 tablet (2.5 mg total) by mouth 2 (two) times daily. Patient not taking: Reported on 09/05/2022 05/03/22   [name], MD  spironolactone (ALDACTONE) 25 MG tablet TAKE 1 TABLET (25  MG TOTAL) BY MOUTH DAILY. 06/20/22   [name], MD  UNKNOWN TO PATIENT Take 1 tablet by mouth daily. 24 hour allergy medication Patient not taking: Reported on 11/21/2022    [provider]      Allergies    Patient has no known allergies.    Review of Systems   Review of Systems  All other systems reviewed and are negative.   Physical Exam Updated Vital Signs BP 112/76 (BP Location: Right Arm)   Pulse 73   Temp 97.6 F (36.4 C) (Oral)   Resp 16   SpO2 99%  Physical Exam Vitals and nursing note reviewed.  Constitutional:      General: She is not in acute distress.    Appearance: Normal appearance. She is well-developed. She is not toxic-appearing.  HENT:     Head: Normocephalic and atraumatic.  Eyes:     General: Lids are normal.     Conjunctiva/sclera: Conjunctivae normal.     Pupils: Pupils are equal, round, and reactive to light.  Neck:     Thyroid: No thyroid mass.     Trachea: No tracheal deviation.  Cardiovascular:     Rate and Rhythm: Normal rate and regular rhythm.     Heart sounds: Normal heart sounds. No murmur heard.    No gallop.  Pulmonary:     Effort: Pulmonary effort  is normal. No respiratory distress.     Breath sounds: Normal breath sounds. No stridor. No decreased breath sounds, wheezing, rhonchi or rales.  Abdominal:     General: There is no distension.     Palpations: Abdomen is soft.     Tenderness: There is no abdominal tenderness. There is no rebound.  Musculoskeletal:        General: No tenderness. Normal range of motion.     Cervical back: Normal range of motion and neck supple.  Skin:    General: Skin is warm and dry.     Findings: No abrasion or rash.  Neurological:     Mental Status: She is alert and oriented to person, place, and time. Mental status is at baseline.     GCS: GCS eye subscore is 4. GCS verbal subscore is 5. GCS motor subscore is 6.     Cranial Nerves: Cranial nerves are intact. No cranial nerve deficit.      Sensory: No sensory deficit.     Motor: Motor function is intact.  Psychiatric:        Attention and Perception: Attention normal.        Speech: Speech normal.        Behavior: Behavior normal.     ED Results / Procedures / Treatments   Labs (all labs ordered are listed, but only abnormal results are displayed) Labs Reviewed  CBC - Abnormal; Notable for the following components:      Result Value   WBC 13.7 (*)    All other components within normal limits  I-STAT CHEM 8, ED - Abnormal; Notable for the following components:   Glucose, Bld 296 (*)    All other components within normal limits  I-STAT VENOUS BLOOD GAS, ED - Abnormal; Notable for the following components:   pO2, Ven 60 (*)    All other components within normal limits  URINALYSIS, ROUTINE W REFLEX MICROSCOPIC  CBG MONITORING, ED  TROPONIN I (HIGH SENSITIVITY)  TROPONIN I (HIGH SENSITIVITY)    EKG EKG Interpretation Date/Time:  Wednesday November 22 2022 11:43:43 EDT Ventricular Rate:  71 PR Interval:  156 QRS Duration:  98 QT Interval:  392 QTC Calculation: 425 R Axis:   0  Text Interpretation: Normal sinus rhythm Low voltage QRS Borderline ECG When compared with ECG of 29-Jul-2021 10:37, PREVIOUS ECG IS PRESENT Confirmed by Lorre Nick ([id number]) on 11/22/2022 1:11:15 PM  Radiology No results found.  Procedures Procedures    Medications Ordered in ED Medications - No data to display  ED Course/ Medical Decision Making/ A&P                                 Medical Decision Making Amount and/or Complexity of Data Reviewed Labs: ordered.   Patient is EKG per interpretation shows normal sinus rhythm.  Patient's blood sugar abdominal elevated at 296.  She does not have an elevated anion gap.  Her VBG shows no evidence of metabolic acidosis.  Troponin was negative. Low suspicion for ACS, PE, dissection.   plan will be to place patient back on her metformin and follow-up with her doctor as needed.   Patient also counseled on proper dietary restrictions       Final Clinical Impression(s) / ED Diagnoses Final diagnoses:  None    Rx / DC Orders ED Discharge Orders     None  Lorre Nick, MD 11/22/22 1331

## 2022-11-22 NOTE — ED Provider Triage Note (Signed)
Emergency Medicine Provider Triage Evaluation Note  Ashley Price , a 67 y.o. female  was evaluated in triage.  Pt complains of elevated blood sugar, history of diabetes, blood sugar greater than 500 on arrival to the ED.  Patient endorses increased thirst, urination but denies any other symptoms.  Patient reports that she was at her doctor yesterday and they reported "I think you had a heart attack" they scheduled an echo.  She has not had any chest pain or shortness of breath..  Review of Systems  Positive: Elevated blood sugar Negative: Chest pain, shob  Physical Exam  BP 112/76 (BP Location: Right Arm)   Pulse 73   Temp 97.6 F (36.4 C) (Oral)   Resp 16   SpO2 99%  Gen:   Awake, no distress   Resp:  Normal effort  MSK:   Moves extremities without difficulty  Other:    Medical Decision Making  Medically screening exam initiated at 11:59 AM.  Appropriate orders placed.  Ashley Price was informed that the remainder of the evaluation will be completed by another provider, this initial triage assessment does not replace that evaluation, and the importance of remaining in the ED until their evaluation is complete.  Workup initiated in triage    Olene Floss, PA-C 11/22/22 1200

## 2022-11-23 ENCOUNTER — Other Ambulatory Visit: Payer: Self-pay | Admitting: *Deleted

## 2022-11-23 ENCOUNTER — Encounter: Payer: Self-pay | Admitting: *Deleted

## 2022-11-23 ENCOUNTER — Other Ambulatory Visit: Payer: Self-pay | Admitting: Internal Medicine

## 2022-11-23 DIAGNOSIS — E1122 Type 2 diabetes mellitus with diabetic chronic kidney disease: Secondary | ICD-10-CM | POA: Insufficient documentation

## 2022-11-23 DIAGNOSIS — A5903 Trichomonal cystitis and urethritis: Secondary | ICD-10-CM | POA: Insufficient documentation

## 2022-11-23 DIAGNOSIS — E119 Type 2 diabetes mellitus without complications: Secondary | ICD-10-CM | POA: Insufficient documentation

## 2022-11-23 LAB — URINALYSIS, ROUTINE W REFLEX MICROSCOPIC
Bilirubin Urine: NEGATIVE
Ketones, ur: NEGATIVE
Nitrite: NEGATIVE
Specific Gravity, Urine: 1.025 (ref 1.000–1.030)
Total Protein, Urine: NEGATIVE
Urine Glucose: 100 — AB
Urobilinogen, UA: 0.2 (ref 0.0–1.0)
pH: 6 (ref 5.0–8.0)

## 2022-11-23 LAB — MICROALBUMIN / CREATININE URINE RATIO
Creatinine,U: 142.1 mg/dL
Microalb Creat Ratio: 0.7 mg/g (ref 0.0–30.0)
Microalb, Ur: 0.9 mg/dL (ref 0.0–1.9)

## 2022-11-23 MED ORDER — FREESTYLE LIBRE 3 SENSOR MISC: 1.0000 | Freq: Every day | 5 refills | Status: DC

## 2022-11-23 MED ORDER — FREESTYLE LIBRE 3 READER DEVI: 1.0000 | Freq: Every day | 3 refills | Status: DC

## 2022-11-23 MED ORDER — INSULIN PEN NEEDLE 32G X 6 MM MISC: 1.0000 | 0 refills | Status: DC

## 2022-11-23 MED ORDER — TOUJEO SOLOSTAR 300 UNIT/ML ~~LOC~~ SOPN: 20.0000 [IU] | PEN_INJECTOR | Freq: Every day | SUBCUTANEOUS | 0 refills | Status: DC

## 2022-11-23 MED ORDER — TINIDAZOLE 500 MG PO TABS: 2.0000 g | ORAL_TABLET | Freq: Once | ORAL | 0 refills | Status: AC

## 2022-11-23 MED ORDER — GVOKE HYPOPEN 2-PACK 1 MG/0.2ML ~~LOC~~ SOAJ: 1.0000 | Freq: Every day | SUBCUTANEOUS | 5 refills | Status: DC | PRN

## 2022-11-23 NOTE — Patient Outreach (Deleted)
Care Management   Visit Note  11/23/2022 Name: Ashley Price MRN: [id number] DOB: July 28, 1955  Subjective: Ashley Price is a 67 y.o. year old female who is a primary care patient of [name], MD. The Care Management team was consulted for assistance.      Engaged with patient spoke with patient by telephone.    Goals Addressed             This Visit's Progress    RNCM Care Plan for Effective Management of DM, PAD       Current Barriers:  Knowledge Deficits related to plan of care for management of DMII and Peripheral Arterial Diease  Chronic Disease Management support and education needs related to DMII and Peripheral Arterial Disease  Literacy barriers Cognitive Deficits  RNCM Clinical Goal(s):  Patient will verbalize understanding of plan for management of DMII and PAD as evidenced by maintaining a record of all blood sugars & being consistent with diet and exercise Take all medications exactly as prescribed and will call provider for medication related questions as evidenced by working with Pharm D & RNCM for effective management of disease process Demonstrate Improved and Ongoing health management independence as evidenced by lowering A1C and keeping log of blood sugars. Continue to work with RN Care Manager to address care management and care coordination needs related to  DMII and PAD as evidenced by adherence to CM Team Scheduled appointments Work with pharmacist to address literacy concerns and limited education about DM related to DMII as evidenced by review or EMR and patient or pharmacist report through collaboration with Medical illustrator, provider, and care team.   Interventions: Evaluation of current treatment plan related to  self management and patient's adherence to plan as established by provider   Diabetes Interventions:  (Status:  New goal.) Long Term Goal Assessed patient's understanding of A1c goal: <7% Provided education to patient about basic DM  disease process Reviewed medications with patient and discussed importance of medication adherence Counseled on importance of regular laboratory monitoring as prescribed Discussed plans with patient for ongoing care management follow up and provided patient with direct contact information for care management team Provided patient with written educational materials related to hypo and hyperglycemia and importance of correct treatment Reviewed scheduled/upcoming provider appointments including: 11-27-2022 with Pharm D to discuss diabetes and new start of insulin Advised patient, providing education and rationale, to check cbg as directed and record, calling MD for findings outside established parameters Referral made to pharmacy team for assistance with DM Management Review of patient status, including review of consultants reports, relevant laboratory and other test results, and medications completed Screening for signs and symptoms of depression related to chronic disease state  Assessed social determinant of health barriers Lab Results  Component Value Date   HGBA1C 9.3 (H) 11/21/2022    PAD  (Status:  New goal. and Goal on track:  Yes.)  Long Term Goal Evaluation of current treatment plan related to  PAD , Literacy concerns and Limited education about PAD* self-management and patient's adherence to plan as established by provider. Discussed plans with patient for ongoing care management follow up and provided patient with direct contact information for care management team Provided education to patient re: peripheral arterial diease and need for continued exercise as tolerated Reviewed medications with patient and discussed medication adherence to treatment plan Provided patient with written educational materials related to Peripheral Arterial Disease Discussed plans with patient for ongoing care management follow up  and provided patient with direct contact information for care management  team Advised patient to discuss any findings such as numbness, tingling or wounds to her lower extremities with provider Screening for signs and symptoms of depression related to chronic disease state  Assessed social determinant of health barriers  Patient Goals/Self-Care Activities: Take all medications as prescribed Attend all scheduled provider appointments Call pharmacy for medication refills 3-7 days in advance of running out of medications Perform all self care activities independently  Call provider office for new concerns or questions  call the Suicide and Crisis Lifeline: 988 call the [place] National Suicide Prevention Lifeline: (514) 275-7833 or TTY: (314)329-0430 TTY 819-681-6527) to talk to a trained counselor call 1-800-273-TALK (toll free, 24 hour hotline) go to [facility] [address] [place] [phone]) call 911 if experiencing a Mental Health or Behavioral Health Crisis  keep appointment with eye doctor check feet daily for cuts, sores or redness enter blood sugar readings and medication or insulin into daily log take the blood sugar log to all doctor visits keep a food diary manage portion size switch to sugar-free drinks wash and dry feet carefully every day  Follow Up Plan:  Telephone follow up appointment with care management team member scheduled for:  12-26-2022 at 10:30 am              Consent to Services:  Patient was given information about care management services, agreed to services, and gave verbal consent to participate.   Plan: Telephone follow up appointment with care management team member scheduled for: 12-26-2022 at 10:30 am  Danise Edge, BSN RN RN Care Manager  [facility]  Ambulatory Care Management  Direct Number: [phone]

## 2022-11-23 NOTE — Addendum Note (Signed)
Addended by: Daryll Brod on: 11/23/2022 12:03 PM   Modules accepted: Orders

## 2022-11-23 NOTE — Patient Instructions (Addendum)
Care Management   Initial Visit Note  11/23/2022 Name: Ashley Price MRN: [id number] DOB: 24-Oct-1955  Ashley Price is enrolled in a Managed Medicaid plan: No. Outreach attempt today was successful.    Assessment: Ashley Price is a 67 y.o. year old female who sees Ashley Grandchild, MD for primary care. The care management team was consulted for assistance with care management and care coordination needs related to Disease Management, Educational Needs, Care Coordination, and Medication Management and Education.   Review of patient status, including review of consultants reports, relevant laboratory and other test results, and collaboration with appropriate care team members and the patient's provider was performed as part of comprehensive patient evaluation and provision of care management services.    SDOH (Social Determinants of Health) screening performed today. See Care Plan Entry related to challenges with: None   Goals Addressed             This Visit's Progress    RNCM Care Plan for Effective Management of DM, PAD       Current Barriers:  Knowledge Deficits related to plan of care for management of DMII and Peripheral Arterial Diease  Chronic Disease Management support and education needs related to DMII and Peripheral Arterial Disease  Literacy barriers Cognitive Deficits  RNCM Clinical Goal(s):  Patient will verbalize understanding of plan for management of DMII and PAD as evidenced by maintaining a record of all blood sugars & being consistent with diet and exercise Take all medications exactly as prescribed and will call provider for medication related questions as evidenced by working with Pharm D & RNCM for effective management of disease process Demonstrate Improved and Ongoing health management independence as evidenced by lowering A1C and keeping log of blood sugars. Continue to work with Medical illustrator to address care management and care coordination needs  related to  DMII and PAD as evidenced by adherence to CM Team Scheduled appointments Work with pharmacist to address literacy concerns and limited education about DM related to DMII as evidenced by review or EMR and patient or pharmacist report through collaboration with Medical illustrator, provider, and care team.   Interventions: Evaluation of current treatment plan related to  self management and patient's adherence to plan as established by provider   Diabetes Interventions:  (Status:  New goal.) Long Term Goal Assessed patient's understanding of A1c goal: <7% Provided education to patient about basic DM disease process Reviewed medications with patient and discussed importance of medication adherence. Received incoming from daughter Ashley Price, [place] with questions about continuation of Metformin. RNCM advised that MD has discontinued Metformin and Farxgia and started insulin pen and freestyle Libre. RNCM advised that she could call back once she has obtained the medication and walkthrough of medication preparation/administration would be provided to patient/daughter.  Counseled on importance of regular laboratory monitoring as prescribed Discussed plans with patient for ongoing care management follow up and provided patient with direct contact information for care management team Provided patient with written educational materials related to hypo and hyperglycemia and importance of correct treatment Reviewed scheduled/upcoming provider appointments including: 11-27-2022 with Pharm D to discuss diabetes and new start of insulin Advised patient, providing education and rationale, to check cbg as directed and record, calling MD for findings outside established parameters Referral made to pharmacy team for assistance with DM Management Review of patient status, including review of consultants reports, relevant laboratory and other test results, and medications completed Screening for signs and symptoms of  depression related to chronic disease state  Assessed social determinant of health barriers Lab Results  Component Value Date   HGBA1C 9.3 (H) 11/21/2022    PAD  (Status:  New goal. and Goal on track:  Yes.)  Long Term Goal Evaluation of current treatment plan related to  PAD , Literacy concerns and Limited education about PAD* self-management and patient's adherence to plan as established by provider. Discussed plans with patient for ongoing care management follow up and provided patient with direct contact information for care management team Provided education to patient re: peripheral arterial diease and need for continued exercise as tolerated Reviewed medications with patient and discussed medication adherence to treatment plan Provided patient with written educational materials related to Peripheral Arterial Disease Discussed plans with patient for ongoing care management follow up and provided patient with direct contact information for care management team Advised patient to discuss any findings such as numbness, tingling or wounds to her lower extremities with provider Screening for signs and symptoms of depression related to chronic disease state  Assessed social determinant of health barriers  Patient Goals/Self-Care Activities: Take all medications as prescribed Attend all scheduled provider appointments Call pharmacy for medication refills 3-7 days in advance of running out of medications Perform all self care activities independently  Call provider office for new concerns or questions  call the Suicide and Crisis Lifeline: 988 call the [place] National Suicide Prevention Lifeline: 325-799-0493 or TTY: 908-158-1098 TTY 507-158-3332) to talk to a trained counselor call 1-800-273-TALK (toll free, 24 hour hotline) go to [facility] [address], [place] [id number]) call 911 if experiencing a Mental Health or Behavioral Health Crisis   keep appointment with eye doctor check feet daily for cuts, sores or redness enter blood sugar readings and medication or insulin into daily log take the blood sugar log to all doctor visits keep a food diary manage portion size switch to sugar-free drinks wash and dry feet carefully every day  Follow Up Plan:  Telephone follow up appointment with care management team member scheduled for:  12-26-2022 at 10:30 am             Follow up plan:  Telephone follow up appointment with care management team member scheduled for: 12-26-2022 at 10:30 am  Ms. Columbia was given information about Care Management services today including:  Care Management services include personalized support from designated clinical staff supervised by a physician, including individualized plan of care and coordination with other care providers 24/7 contact phone numbers for assistance for urgent and routine care needs. The patient may stop CCM services at any time (effective at the end of the month) by phone call to the office staff.  Patient agreed to services and verbal consent obtained.  Danise Edge, BSN RN RN Care Manager  [facility]  Direct Number: [phone]     Diabetes Mellitus and Exercise Regular exercise is important for your health, especially if you have diabetes mellitus. Exercise is not just about losing weight. It can also help you increase muscle strength and bone density and reduce body fat and stress. This can help your level of endurance and make you more fit and flexible. Why should I exercise if I have diabetes? Exercise has many benefits for people with diabetes. It can: Help lower and control your blood sugar (glucose). Help your body respond better and become more sensitive to the hormone insulin. Reduce how much insulin your body needs. Lower  your risk for heart disease by: Lowering how much "bad" cholesterol and triglycerides you have in your  body. Increasing how much "good" cholesterol you have in your body. Lowering your blood pressure. Lowering your blood glucose levels. What is my activity plan? Your health care provider or an expert trained in diabetes care (certified diabetes educator) can help you make an activity plan. This plan can help you find the type of exercise that works for you. It may also tell you how often to exercise and for how long. Be sure to: Get at least 150 minutes of medium-intensity or high-intensity exercise each week. This may involve brisk walking, biking, or water aerobics. Do stretching and strengthening exercises at least 2 times a week. This may involve yoga or weight lifting. Spread out your activity over at least 3 days of the week. Get some form of physical activity each day. Do not go more than 2 days in a row without some kind of activity. Avoid being inactive for more than 30 minutes at a time. Take frequent breaks to walk or stretch. Choose activities that you enjoy. Set goals that you know you can accomplish. Start slowly and increase the intensity of your exercise over time. How do I manage my diabetes during exercise?  Monitor your blood glucose Check your blood glucose before and after you exercise. If your blood glucose is 240 mg/dL (16.1 mmol/L) or higher before you exercise, check your urine for ketones. These are chemicals created by the liver. If you have ketones in your urine, do not exercise until your blood glucose returns to normal. If your blood glucose is 100 mg/dL (5.6 mmol/L) or lower, eat a snack that has 15-20 grams of carbohydrate in it. Check your blood glucose 15 minutes after the snack to make sure that your level is above 100 mg/dL (5.6 mmol/L) before you start to exercise. Your risk for low blood glucose (hypoglycemia) goes up during and after exercise. Know the symptoms of this condition and how to treat it. Follow these instructions at home: Keep a carbohydrate  snack on hand for use before, during, and after exercise. This can help prevent or treat hypoglycemia. Avoid injecting insulin into parts of your body that are going to be used during exercise. This may include: Your arms, when you are going to play tennis. Your legs, when you are about to go jogging. Keep track of your exercise habits. This can help you and your health care provider watch and adjust your activity plan. Write down: What you eat before and after you exercise. Blood glucose levels before and after you exercise. The type and amount of exercise you do. Talk to your health care provider before you start a new activity. They may need to: Make sure that the activity is safe for you. Adjust your insulin, other medicines, and food that you eat. Drink water while you exercise. This can stop you from losing too much water (dehydration). It can also prevent problems caused by having a lot of heat in your body (heat stroke). Where to find more information American Diabetes Association: diabetes.org Association of Diabetes Care & Education Specialists: diabeteseducator.org This information is not intended to replace advice given to you by your health care provider. Make sure you discuss any questions you have with your health care provider. Document Revised: 07/06/2021 Document Reviewed: 07/06/2021 Elsevier Patient Education  2024 Elsevier Inc.  Diabetes Mellitus and Foot Care Diabetes, also called diabetes mellitus, may cause problems with your feet  and legs because of poor blood flow (circulation). Poor circulation may make your skin: Become thinner and drier. Break more easily. Heal more slowly. Peel and crack. You may also have nerve damage (neuropathy). This can cause decreased feeling in your legs and feet. This means that you may not notice minor injuries to your feet that could lead to more serious problems. Finding and treating problems early is the best way to prevent future  foot problems. How to care for your feet Foot hygiene  Wash your feet daily with warm water and mild soap. Do not use hot water. Then, pat your feet and the areas between your toes until they are fully dry. Do not soak your feet. This can dry your skin. Trim your toenails straight across. Do not dig under them or around the cuticle. File the edges of your nails with an emery board or nail file. Apply a moisturizing lotion or petroleum jelly to the skin on your feet and to dry, brittle toenails. Use lotion that does not contain alcohol and is unscented. Do not apply lotion between your toes. Shoes and socks Wear clean socks or stockings every day. Make sure they are not too tight. Do not wear knee-high stockings. These may decrease blood flow to your legs. Wear shoes that fit well and have enough cushioning. Always look in your shoes before you put them on to be sure there are no objects inside. To break in new shoes, wear them for just a few hours a day. This prevents injuries on your feet. Wounds, scrapes, corns, and calluses  Check your feet daily for blisters, cuts, bruises, sores, and redness. If you cannot see the bottom of your feet, use a mirror or ask someone for help. Do not cut off corns or calluses or try to remove them with medicine. If you find a minor scrape, cut, or break in the skin on your feet, keep it and the skin around it clean and dry. You may clean these areas with mild soap and water. Do not clean the area with peroxide, alcohol, or iodine. If you have a wound, scrape, corn, or callus on your foot, look at it several times a day to make sure it is healing and not infected. Check for: Redness, swelling, or pain. Fluid or blood. Warmth. Pus or a bad smell. General tips Do not cross your legs. This may decrease blood flow to your feet. Do not use heating pads or hot water bottles on your feet. They may burn your skin. If you have lost feeling in your feet or legs, you may  not know this is happening until it is too late. Protect your feet from hot and cold by wearing shoes, such as at the beach or on hot pavement. Schedule a complete foot exam at least once a year or more often if you have foot problems. Report any cuts, sores, or bruises to your health care provider right away. Where to find more information American Diabetes Association: diabetes.org Association of Diabetes Care & Education Specialists: diabeteseducator.org Contact a health care provider if: You have a condition that increases your risk of infection, and you have any cuts, sores, or bruises on your feet. You have an injury that is not healing. You have redness on your legs or feet. You feel burning or tingling in your legs or feet. You have pain or cramps in your legs and feet. Your legs or feet are numb. Your feet always feel cold. You have  pain around any toenails. Get help right away if: You have a wound, scrape, corn, or callus on your foot and: You have signs of infection. You have a fever. You have a red line going up your leg. This information is not intended to replace advice given to you by your health care provider. Make sure you discuss any questions you have with your health care provider. Document Revised: 07/20/2021 Document Reviewed: 07/20/2021 Elsevier Patient Education  2024 Elsevier Inc.   Diabetes Mellitus and Nutrition, Adult When you have diabetes, or diabetes mellitus, it is very important to have healthy eating habits because your blood sugar (glucose) levels are greatly affected by what you eat and drink. Eating healthy foods in the right amounts, at about the same times every day, can help you: Manage your blood glucose. Lower your risk of heart disease. Improve your blood pressure. Reach or maintain a healthy weight. What can affect my meal plan? Every person with diabetes is different, and each person has different needs for a meal plan. Your health care  provider may recommend that you work with a dietitian to make a meal plan that is best for you. Your meal plan may vary depending on factors such as: The calories you need. The medicines you take. Your weight. Your blood glucose, blood pressure, and cholesterol levels. Your activity level. Other health conditions you have, such as heart or kidney disease. How do carbohydrates affect me? Carbohydrates, also called carbs, affect your blood glucose level more than any other type of food. Eating carbs raises the amount of glucose in your blood. It is important to know how many carbs you can safely have in each meal. This is different for every person. Your dietitian can help you calculate how many carbs you should have at each meal and for each snack. How does alcohol affect me? Alcohol can cause a decrease in blood glucose (hypoglycemia), especially if you use insulin or take certain diabetes medicines by mouth. Hypoglycemia can be a life-threatening condition. Symptoms of hypoglycemia, such as sleepiness, dizziness, and confusion, are similar to symptoms of having too much alcohol. Do not drink alcohol if: Your health care provider tells you not to drink. You are pregnant, may be pregnant, or are planning to become pregnant. If you drink alcohol: Limit how much you have to: 0-1 drink a day for women. 0-2 drinks a day for men. Know how much alcohol is in your drink. In the [place] one drink equals one 12 oz bottle of beer (355 mL), one 5 oz glass of wine (148 mL), or one 1 oz glass of hard liquor (44 mL). Keep yourself hydrated with water, diet soda, or unsweetened iced tea. Keep in mind that regular soda, juice, and other mixers may contain a lot of sugar and must be counted as carbs. What are tips for following this plan?  Reading food labels Start by checking the serving size on the Nutrition Facts label of packaged foods and drinks. The number of calories and the amount of carbs, fats, and  other nutrients listed on the label are based on one serving of the item. Many items contain more than one serving per package. Check the total grams (g) of carbs in one serving. Check the number of grams of saturated fats and trans fats in one serving. Choose foods that have a low amount or none of these fats. Check the number of milligrams (mg) of salt (sodium) in one serving. Most people should limit total  sodium intake to less than 2,300 mg per day. Always check the nutrition information of foods labeled as "low-fat" or "nonfat." These foods may be higher in added sugar or refined carbs and should be avoided. Talk to your dietitian to identify your daily goals for nutrients listed on the label. Shopping Avoid buying canned, pre-made, or processed foods. These foods tend to be high in fat, sodium, and added sugar. Shop around the outside edge of the grocery store. This is where you will most often find fresh fruits and vegetables, bulk grains, fresh meats, and fresh dairy products. Cooking Use low-heat cooking methods, such as baking, instead of high-heat cooking methods, such as deep frying. Cook using healthy oils, such as olive, canola, or sunflower oil. Avoid cooking with butter, cream, or high-fat meats. Meal planning Eat meals and snacks regularly, preferably at the same times every day. Avoid going long periods of time without eating. Eat foods that are high in fiber, such as fresh fruits, vegetables, beans, and whole grains. Eat 4-6 oz (112-168 g) of lean protein each day, such as lean meat, chicken, fish, eggs, or tofu. One ounce (oz) (28 g) of lean protein is equal to: 1 oz (28 g) of meat, chicken, or fish. 1 egg.  cup (62 g) of tofu. Eat some foods each day that contain healthy fats, such as avocado, nuts, seeds, and fish. What foods should I eat? Fruits Berries. Apples. Oranges. Peaches. Apricots. Plums. Grapes. Mangoes. Papayas. Pomegranates. Kiwi. Cherries. Vegetables Leafy  greens, including lettuce, spinach, kale, chard, collard greens, mustard greens, and cabbage. Beets. Cauliflower. Broccoli. Carrots. Green beans. Tomatoes. Peppers. Onions. Cucumbers. Brussels sprouts. Grains Whole grains, such as whole-wheat or whole-grain bread, crackers, tortillas, cereal, and pasta. Unsweetened oatmeal. Quinoa. Brown or wild rice. Meats and other proteins Seafood. Poultry without skin. Lean cuts of poultry and beef. Tofu. Nuts. Seeds. Dairy Low-fat or fat-free dairy products such as milk, yogurt, and cheese. The items listed above may not be a complete list of foods and beverages you can eat and drink. Contact a dietitian for more information. What foods should I avoid? Fruits Fruits canned with syrup. Vegetables Canned vegetables. Frozen vegetables with butter or cream sauce. Grains Refined white flour and flour products such as bread, pasta, snack foods, and cereals. Avoid all processed foods. Meats and other proteins Fatty cuts of meat. Poultry with skin. Breaded or fried meats. Processed meat. Avoid saturated fats. Dairy Full-fat yogurt, cheese, or milk. Beverages Sweetened drinks, such as soda or iced tea. The items listed above may not be a complete list of foods and beverages you should avoid. Contact a dietitian for more information. Questions to ask a health care provider Do I need to meet with a certified diabetes care and education specialist? Do I need to meet with a dietitian? What number can I call if I have questions? When are the best times to check my blood glucose? Where to find more information: American Diabetes Association: diabetes.org Academy of Nutrition and Dietetics: eatright.[employer] of Diabetes and Digestive and Kidney Diseases: [email or web address].si Association of Diabetes Care & Education Specialists: diabeteseducator.org Summary It is important to have healthy eating habits because your blood sugar (glucose) levels are greatly  affected by what you eat and drink. It is important to use alcohol carefully. A healthy meal plan will help you manage your blood glucose and lower your risk of heart disease. Your health care provider may recommend that you work with a dietitian to make a meal  plan that is best for you. This information is not intended to replace advice given to you by your health care provider. Make sure you discuss any questions you have with your health care provider. Document Revised: 08/20/2019 Document Reviewed: 08/20/2019 Elsevier Patient Education  2024 Elsevier Inc.   Diabetes Mellitus and Skin Care Diabetes, also called diabetes mellitus, can lead to skin problems. If blood sugar (glucose) is not well controlled, it can cause problems over time. These problems include: Damage to nerves. This can affect your ability to feel wounds. This means you may not notice small skin injuries that could lead to bigger problems. This can also decrease the amount that you sweat, causing dry skin. Damage to blood vessels. The lack of blood flow can cause skin to break down. It can also slow healing time, which can lead to infections. Areas of skin that become thick or discolored. Common skin conditions There are certain skin conditions that often affect people with diabetes. These include: Dry skin. Thin skin. The skin on the feet may get thinner, break more easily, and heal more slowly than normal. Skin infections from bacteria. These include: Styes. These are infections near the eyelid. Boils. These are bumps filled with pus. Infected hair follicles. Infections of the skin around the nails. Fungal skin infections. These are most common in areas where skin rubs together, such as in the armpits or under the breasts. Common skin changes Diabetes can also cause the skin to change. You may develop: Dark, velvety markings on your skin. These may appear on your face, neck, armpits, inner thighs, and groin. Red,  raised, scar-like tissue that may itch, feel painful, or become a wound. Blisters on your feet, toes, hands, or fingers. Thick, wax-like areas of skin. In most cases, these occur on the hands, forehead, or toes. Brown or red, ring-shaped or half-ring-shaped patches of skin on the ears or fingers. Pea-shaped, yellow bumps that may be itchy and have a red ring around them. This may affect your arms, feet, buttocks, and the top of your hands. Round, discolored patches of tan skin that do not hurt or itch. These may look like age spots. Supplies needed: Mild soap or gentle skin cleanser. Lotion. How to care for dry, itchy skin Frequent high glucose levels can cause skin to become itchy. Poor blood circulation and skin infections can make dry skin worse. If you have dry, itchy skin: Avoid very hot showers and baths. Use mild soap and gentle skin cleansers. Do not use soap that is perfumed, harsh, or that dries your skin. Moisturizing soaps may help. Put on moisturizing lotion as soon as you finish bathing. Do not scratch dry skin. Scratching can expose skin to infection. If you have a rash or if your skin is very itchy, contact your health care provider. Skin that is red or covered in a rash may be a sign of an allergic reaction. Very itchy skin may mean that you need help to manage your diabetes better. You may also need treatment for an infection. General tips Most skin problems can be prevented or treated easily if caught early. Talk with your health care provider if you have any concerns. General tips include: Check your skin every day for cuts, bruises, redness, blisters, or sores, especially on your feet. If you cannot see the bottom of your feet, use a mirror or ask someone for help. Tell your health care provider if you have any of these injuries and if they are healing  slowly. Keep your skin clean and dry. Do not use hot water. Moisturize your skin to prevent chapping. Keep your blood  glucose levels within target range. Follow these instructions at home:  Take over-the-counter and prescription medicines only as told by your health care provider. This includes all diabetes medicines you are taking. Schedule a foot exam with your health care provider once a year. During the exam, the structure and skin of your feet will be checked for problems. Make sure that your health care provider does a visual foot exam at every visit. If you get a skin injury, such as a cut, blister, or sore, check the area every day for signs of infection. Check for: Redness, swelling, or pain. Fluid or blood. Warmth. Pus or a bad smell. Do not use any products that contain nicotine or tobacco. These products include cigarettes, chewing tobacco, and vaping devices, such as e-cigarettes. If you need help quitting, ask your health care provider. Where to find more information American Diabetes Association: diabetes.org Association of Diabetes Care & Education Specialists: diabeteseducator.org Contact a health care provider if: You get a cut or sore, especially on your feet. You have signs of infection after a skin injury. You have itchy skin that turns red or develops a rash. You have discolored areas of skin. You have places on your skin that change. They may thicken or appear shiny. This information is not intended to replace advice given to you by your health care provider. Make sure you discuss any questions you have with your health care provider. Document Revised: 07/20/2021 Document Reviewed: 07/20/2021 Elsevier Patient Education  2024 [employer]   Diabetes Mellitus Basics  Diabetes mellitus, or diabetes, is a long-term (chronic) disease. It occurs when the body does not properly use sugar (glucose) that is released from food after you eat. Diabetes mellitus may be caused by one or both of these problems: Your pancreas does not make enough of a hormone called insulin. Your body does  not react in a normal way to the insulin that it makes. Insulin lets glucose enter cells in your body. This gives you energy. If you have diabetes, glucose cannot get into cells. This causes high blood glucose (hyperglycemia). How to treat and manage diabetes You may need to take insulin or other diabetes medicines daily to keep your glucose in balance. If you are prescribed insulin, you will learn how to give yourself insulin by injection. You may need to adjust the amount of insulin you take based on the foods that you eat. You will need to check your blood glucose levels using a glucose monitor as told by your health care provider. The readings can help determine if you have low or high blood glucose. Generally, you should have these blood glucose levels: Before meals (preprandial): 80-130 mg/dL (9.1-4.7 mmol/L). After meals (postprandial): below 180 mg/dL (10 mmol/L). Hemoglobin A1c (HbA1c) level: less than 7%. Your health care provider will set treatment goals for you. Keep all follow-up visits. This is important. Follow these instructions at home: Diabetes medicines Take your diabetes medicines every day as told by your health care provider. List your diabetes medicines here: Name of medicine: ______________________________ Amount (dose): _______________ Time (a.m./p.m.): _______________ Notes: ___________________________________ Name of medicine: ______________________________ Amount (dose): _______________ Time (a.m./p.m.): _______________ Notes: ___________________________________ Name of medicine: ______________________________ Amount (dose): _______________ Time (a.m./p.m.): _______________ Notes: ___________________________________ Insulin If you use insulin, list the types of insulin you use here: Insulin type: ______________________________ Amount (dose): _______________ Time (a.m./p.m.): _______________Notes: ___________________________________  Insulin type:  ______________________________ Amount (dose): _______________ Time (a.m./p.m.): _______________ Notes: ___________________________________ Insulin type: ______________________________ Amount (dose): _______________ Time (a.m./p.m.): _______________ Notes: ___________________________________ Insulin type: ______________________________ Amount (dose): _______________ Time (a.m./p.m.): _______________ Notes: ___________________________________ Insulin type: ______________________________ Amount (dose): _______________ Time (a.m./p.m.): _______________ Notes: ___________________________________ Managing blood glucose  Check your blood glucose levels using a glucose monitor as told by your health care provider. Write down the times that you check your glucose levels here: Time: _______________ Notes: ___________________________________ Time: _______________ Notes: ___________________________________ Time: _______________ Notes: ___________________________________ Time: _______________ Notes: ___________________________________ Time: _______________ Notes: ___________________________________ Time: _______________ Notes: ___________________________________  Low blood glucose Low blood glucose (hypoglycemia) is when glucose is at or below 70 mg/dL (3.9 mmol/L). Symptoms may include: Feeling: Hungry. Sweaty and clammy. Irritable or easily upset. Dizzy. Sleepy. Having: A fast heartbeat. A headache. A change in your vision. Numbness around the mouth, lips, or tongue. Having trouble with: Moving (coordination). Sleeping. Treating low blood glucose To treat low blood glucose, eat or drink something containing sugar right away. If you can think clearly and swallow safely, follow the 15:15 rule: Take 15 grams of a fast-acting carb (carbohydrate), as told by your health care provider. Some fast-acting carbs are: Glucose tablets: take 3-4 tablets. Hard candy: eat 3-5 pieces. Fruit juice:  drink 4 oz (120 mL). Regular (not diet) soda: drink 4-6 oz (120-180 mL). Honey or sugar: eat 1 Tbsp (15 mL). Check your blood glucose levels 15 minutes after you take the carb. If your glucose is still at or below 70 mg/dL (3.9 mmol/L), take 15 grams of a carb again. If your glucose does not go above 70 mg/dL (3.9 mmol/L) after 3 tries, get help right away. After your glucose goes back to normal, eat a meal or a snack within 1 hour. Treating very low blood glucose If your glucose is at or below 54 mg/dL (3 mmol/L), you have very low blood glucose (severe hypoglycemia). This is an emergency. Do not wait to see if the symptoms will go away. Get medical help right away. Call your local emergency services (911 in the [place] Do not drive yourself to the hospital. Questions to ask your health care provider Should I talk with a diabetes educator? What equipment will I need to care for myself at home? What diabetes medicines do I need? When should I take them? How often do I need to check my blood glucose levels? What number can I call if I have questions? When is my follow-up visit? Where can I find a support group for people with diabetes? Where to find more information American Diabetes Association: www.diabetes.org Association of Diabetes Care and Education Specialists: www.diabeteseducator.org Contact a health care provider if: Your blood glucose is at or above 240 mg/dL (64.3 mmol/L) for 2 days in a row. You have been sick or have had a fever for 2 days or more, and you are not getting better. You have any of these problems for more than 6 hours: You cannot eat or drink. You feel nauseous. You vomit. You have diarrhea. Get help right away if: Your blood glucose is lower than 54 mg/dL (3 mmol/L). You get confused. You have trouble thinking clearly. You have trouble breathing. These symptoms may represent a serious problem that is an emergency. Do not wait to see if the symptoms will go  away. Get medical help right away. Call your local emergency services (911 in the [place] Do not drive yourself to the hospital. Summary Diabetes mellitus is a chronic disease that  occurs when the body does not properly use sugar (glucose) that is released from food after you eat. Take insulin and diabetes medicines as told. Check your blood glucose every day, as often as told. Keep all follow-up visits. This is important. This information is not intended to replace advice given to you by your health care provider. Make sure you discuss any questions you have with your health care provider. Document Revised: 05/20/2019 Document Reviewed: 05/20/2019 Elsevier Patient Education  2024 [employer]  Hypoglycemia Hypoglycemia is when the sugar (glucose) level in your blood is too low. Low blood sugar can happen to people who have diabetes and people who do not have diabetes. Low blood sugar can happen quickly, and it can be an emergency. What are the causes? This condition happens most often in people who have diabetes. It may be caused by: Diabetes medicine. Not eating enough, or not eating often enough. Doing more physical activity. Drinking alcohol on an empty stomach. If you do not have diabetes, this condition may be caused by: A tumor in the pancreas. Not eating enough, or not eating for long periods at a time (fasting). A very bad infection or illness. Problems after having weight loss (bariatric) surgery. Kidney failure or liver failure. Certain medicines. What increases the risk? This condition is more likely to develop in people who: Have diabetes and take medicines to lower their blood sugar. Abuse alcohol. Have a very bad illness. What are the signs or symptoms? Mild Hunger. Sweating and feeling clammy. Feeling dizzy or light-headed. Being sleepy or having trouble sleeping. Feeling like you may vomit (nauseous). A fast heartbeat. A headache. Blurry vision. Mood changes,  such as: Being grouchy. Feeling worried or nervous (anxious). Tingling or loss of feeling (numbness) around your mouth, lips, or tongue. Moderate Confusion and poor judgment. Behavior changes. Weakness. Uneven heartbeat. Trouble with moving (coordination). Very low Very low blood sugar (severe hypoglycemia) is a medical emergency. It can cause: Fainting. Seizures. Loss of consciousness (coma). Death. How is this treated? Treating low blood sugar Low blood sugar is often treated by eating or drinking something that has sugar in it right away. The food or drink should contain 15 grams of a fast-acting carb (carbohydrate). Options include: 4 oz (120 mL) of fruit juice. 4 oz (120 mL) of regular soda (not diet soda). A few pieces of hard candy. Check food labels to see how many pieces to eat for 15 grams. 1 Tbsp (15 mL) of sugar or honey. 4 glucose tablets. 1 tube of glucose gel. Treating low blood sugar if you have diabetes If you can think clearly and swallow safely, follow the 15:15 rule: Take 15 grams of a fast-acting carb. Talk with your doctor about how much you should take. Always keep a source of fast-acting carb with you, such as: Glucose tablets (take 4 tablets). A few pieces of hard candy. Check food labels to see how many pieces to eat for 15 grams. 4 oz (120 mL) of fruit juice. 4 oz (120 mL) of regular soda (not diet soda). 1 Tbsp (15 mL) of honey or sugar. 1 tube of glucose gel. Check your blood sugar 15 minutes after you take the carb. If your blood sugar is still at or below 70 mg/dL (3.9 mmol/L), take 15 grams of a carb again. If your blood sugar does not go above 70 mg/dL (3.9 mmol/L) after 3 tries, get help right away. After your blood sugar goes back to normal, eat a meal or  a snack within 1 hour.  Treating very low blood sugar If your blood sugar is below 54 mg/dL (3 mmol/L), you have very low blood sugar, or severe hypoglycemia. This is an emergency. Get  medical help right away. If you have very low blood sugar and you cannot eat or drink, you will need to be given a hormone called glucagon. A family member or friend should learn how to check your blood sugar and how to give you glucagon. Ask your doctor if you need to have an emergency glucagon kit at home. Very low blood sugar may also need to be treated in a hospital. Follow these instructions at home: General instructions Take over-the-counter and prescription medicines only as told by your doctor. Stay aware of your blood sugar as told by your doctor. If you drink alcohol: Limit how much you have to: 0-1 drink a day for women who are not pregnant. 0-2 drinks a day for men. Know how much alcohol is in your drink. In the [place] one drink equals one 12 oz bottle of beer (355 mL), one 5 oz glass of wine (148 mL), or one 1 oz glass of hard liquor (44 mL). Be sure to eat food when you drink alcohol. Know that your body absorbs alcohol quickly. This may lead to low blood sugar later. Be sure to keep checking your blood sugar. Keep all follow-up visits. If you have diabetes:  Always have a fast-acting carb (15 grams) with you to treat low blood sugar. Follow your diabetes care plan as told by your doctor. Make sure you: Know the symptoms of low blood sugar. Check your blood sugar as often as told. Always check it before and after exercise. Always check your blood sugar before you drive. Take your medicines as told. Follow your meal plan. Eat on time. Do not skip meals. Share your diabetes care plan with: Your work or school. People you live with. Carry a card or wear jewelry that says you have diabetes. Where to find more information American Diabetes Association: www.diabetes.org Contact a doctor if: You have trouble keeping your blood sugar in your target range. You have low blood sugar often. Get help right away if: You still have symptoms after you eat or drink something that  contains 15 grams of fast-acting carb, and you cannot get your blood sugar above 70 mg/dL by following the 57:84 rule. Your blood sugar is below 54 mg/dL (3 mmol/L). You have a seizure. You faint. These symptoms may be an emergency. Get help right away. Call your local emergency services (911 in the [place] Do not wait to see if the symptoms will go away. Do not drive yourself to the hospital. Summary Hypoglycemia happens when the level of sugar (glucose) in your blood is too low. Low blood sugar can happen to people who have diabetes and people who do not have diabetes. Low blood sugar can happen quickly, and it can be an emergency. Make sure you know the symptoms of low blood sugar and know how to treat it. Always keep a source of sugar (fast-acting carb) with you to treat low blood sugar. This information is not intended to replace advice given to you by your health care provider. Make sure you discuss any questions you have with your health care provider. Document Revised: 12/18/2019 Document Reviewed: 12/18/2019 Elsevier Patient Education  2024 [employer]   Insulin Treatment for Diabetes Mellitus Diabetes, also known as diabetes mellitus, is a long-term (chronic) disease. It occurs  when the body does not properly use sugar (glucose). Glucose levels are controlled by a hormone called insulin. Insulin is made in the pancreas, which is an organ behind the stomach. In type 1 diabetes, the pancreas does not make insulin. In type 2 diabetes, the body does not use or respond to insulin properly (called insulin resistance). Also, in some people, the pancreas does not make enough insulin. Treatment plans for diabetes vary. They are unique for each person. Treatment plans depend on: The type of diabetes. The treatment goals. Your medical history. People with type 1 diabetes and some with type 2 diabetes will need to take insulin as part of their treatment plan. Ask questions to understand your  insulin treatment so you can be active in managing your diabetes. Types of insulin Insulin molecules are delicate. Insulin is destroyed by enzymes in the stomach or intestine. For this reason, insulin is not given in pill form. It is either injected under the skin or inhaled into the lungs. You may use more than one type of insulin. The different types of insulin are described below. It is important to know the onset, peak, and duration of the type of insulin you take. The onset is when it starts lowering blood glucose. The peak is when it works the strongest. The duration is how long it works. Insulin comes in different strengths. The most common strength is U-100, or 100 units per 1 mL of insulin. It is important to make sure you are using the right strength of insulin with the right syringe. Rapid-acting insulin: Onset: Within 15 minutes. Peak: About 1-2 hours. Duration: 2-4 hours. Works well when taken right before a meal to quickly lower your blood glucose. This type of insulin is available as an injection and an inhaler. You and your health care provider will decide if an injection or inhaler is best for you. Short-acting insulin: Onset: Within 30 minutes. Peak: 2-3 hours. Duration: 3-6 hours. Should be taken about 30 minutes before you start eating a meal. Intermediate-acting insulin: Onset: Within 1.5-4 hours. Peak: 4-12 hours. Duration: 12-18 hours. Lowers your blood glucose for a longer period of time. However, it does not work as well for lowering blood glucose right after a meal. Usually used 1-2 times per day. Long-acting insulin: Mimics the small amount of insulin that your pancreas usually makes throughout the day. Onset: Within 2 hours. Peak: There is no peak. Long-acting insulins lower blood glucose levels evenly throughout the day. Duration: At least 24 hours. Long-acting insulins should be used one or two times a day. Concentrated insulin: Concentrated insulins are  available in higher concentrations than U-100 insulins. Concentrated insulins are helpful for people who require high doses of insulin, usually more than 100 units per day. Concentrated insulins deliver the same amount of insulin but in a smaller volume. Concentrated insulins are available as: Humulin (Regular insulin U-500) has 500 units per 1 mL of insulin. This insulin should only be used only with the U-500 syringe or U-500 insulin pen. Do not use another type of syringe with this insulin. The wrong type of syringe can cause serious problems such as low blood glucose. Humalog (Insulin Lispro, U-200) and Tresiba (Insulin degludec, U-200) have 200 units per 1 mL of insulin. These insulins are only available as a U-200 pen. Toujeo (Insulin glargine, U-300) has 300 units per 1 mL of insulin. This insulin is only available as a U-300 pen. Common terms related to insulin treatment: Some terms that you might  hear include: Basal insulin or background insulin This is the constant amount of insulin that keeps your blood glucose levels stable when you are not eating. People who have type 1 diabetes need basal insulin in a nonstop (continuous) or steady dose 24 hours a day. People with type 2 diabetes may also get basal insulin. Usually, intermediate-acting or long-acting insulin is used one or two times a day to manage glucose levels. Bolus insulin This refers to meal-related insulin (prandial insulin). Blood glucose rises quickly after a meal (postprandial). Rapid-acting or short-acting insulin can be used before a meal (preprandial) to help control blood glucose after the meal. You may be told to adjust the amount of bolus insulin that you take based on how much carbohydrate is in your meal. Correction insulin This may also be called a correction dose or supplemental dose. This is a small amount of rapid-acting or short-acting insulin that can be used to lower your blood glucose if it is too high. You may  be told to check your blood glucose at certain times of the day and use correction insulin as needed. Tight control, intensive therapy, or basal-bolus insulin therapy These terms are used for insulin plans that keep your blood glucose as close to your target as possible. They prevent your blood glucose from getting too high at any time of day, but especially after meals. People who have tight control of their diabetes have fewer long-term problems caused by diabetes. Insulins are also available as mixtures of basal and bolus insulins. Using a premixed insulin decreases the number of injections you might need everyday. Talk to your health care provider to see if an insulin mixture is right for you. What are the risks? Possible side effects of insulin treatment include: Low blood glucose (hypoglycemia). Weight gain. Bruising or irritation at the injection site. Some of these side effects can be caused by incorrect insulin doses and improper injection technique. Be sure to learn how to inject insulin properly. Supplies needed: Soap and water. Insulin. Insulin syringe or insulin pen needles. Alcohol wipes. Blood glucose meter. Blood glucose test strips and lancets. A disposal container for sharp items (sharps container), such as an empty plastic bottle with a cover. How to use insulin Most often, insulin is given through an injection. It is injected using a syringe and needle, an insulin pen, a pump, or a jet injector. Your health care provider will: Prescribe the type and amount of insulin that you need. Tell you when you should inject your insulin. Usually, you will give yourself insulin injections. Other people can also be taught how to give you injections. You will use a type of syringe that is made only for insulin. Some people may have an insulin pump that delivers insulin steadily through a tube (cannula) that is placed under the skin. Insulin is also available in an inhaled form. An  inhaler delivers bolus insulin doses. Inhaled insulin does not require injections, but most people will still need to use basal insulin that is injected. Injection sites Insulin is injected into a layer of fatty tissue under your skin. Good places to inject insulin include: Abdomen. Generally, the abdomen is the best place to inject insulin. However, you should avoid any area that is less than 2 inches (5 cm) from your belly button. Front of thigh. Upper, outer side of thigh. Upper, outer side of arm. Upper, outer part of buttock. It is important to: Give your injection in a slightly different place each time. This  helps to prevent irritation and improve absorption. Avoid injecting into areas that have scar tissue. Follow these instructions at home: Eating and drinking Talk with your health care provider or pharmacist about the type of insulin you should take and when you should take it. You should know when your insulin peaksand how long it lasts. You need this information to plan mealtimes and exercise. Follow instructions from your health care provider about a healthy meal plan. Do not skip meals. Drink enough fluid to keep your urine pale yellow. Follow your sick day plan whenever you cannot eat or drink normally. Make this plan in advance with your health care provider. Lifestyle Work with your health care provider to manage your weight, blood pressure, cholesterol, and stress. Exercise regularly. Avoid drinking alcohol. Do not use any products that contain nicotine or tobacco. These products include cigarettes, chewing tobacco, and vaping devices, such as e-cigarettes. If you need help quitting, ask your health care provider. General instructions  Check your blood glucose as told. Your health care provider will tell you how often and when you should check your blood glucose. Your health care provider will also tell you what your glucose levels should be. Make sure to check your blood  glucose before and after you exercise. If you exercise longer or in a different way than usual, check your blood glucose more often. Make sure you know the symptoms of high and low blood sugar and how to treat these. Take over-the-counter and prescription medicines only as told by your health care provider. Carry a medical alert card or wear medical alert jewelry. Keep all follow-up visits. This is important. Summary Diabetes is a long-term disease. It occurs when the body does not properly use glucose. Glucose levels are controlled by a hormone called insulin. Insulin treatment varies depending on your type of diabetes, your treatment goals, and your medical history. Talk with your health care provider or pharmacist about the type of insulin you should take and when you should take it. Check your blood glucose as told by your health care provider. Your health care provider will tell you how often and when you should check your blood glucose, and what your glucose levels should be. Know the symptoms of high and low blood glucose and how to treat them. This information is not intended to replace advice given to you by your health care provider. Make sure you discuss any questions you have with your health care provider. Document Revised: 11/19/2019 Document Reviewed: 11/19/2019 Elsevier Patient Education  2024 Elsevier Inc.  Hyperglycemia Hyperglycemia is when the sugar (glucose) level in your blood is too high. High blood sugar can happen to people who have or do not have diabetes. High blood sugar can happen quickly. It can be an emergency. What are the causes? If you have diabetes, high blood sugar may be caused by: Medicines that increase blood sugar or affect your control of diabetes. Getting less physical activity. Overeating. Being sick or injured or having an infection. Having surgery. Stress. Not giving yourself enough insulin (if you are taking it). You may have high blood sugar  because you have diabetes that has not been diagnosed yet. If you do not have diabetes, high blood sugar may be caused by: Certain medicines. Stress. A bad illness. An infection. Having surgery. Diseases of the pancreas. What increases the risk? This condition is more likely to develop in people who have risk factors for diabetes, such as: Having a family member with diabetes. Certain  conditions in which the body's defense system (immune system) attacks itself. These are called autoimmune disorders. Being overweight. Not being active. Having a condition called insulin resistance. Having a history of: Prediabetes. Diabetes when pregnant. Polycystic ovarian syndrome (PCOS). What are the signs or symptoms? This condition may not cause symptoms. If you do have symptoms, they may include: Feeling more thirsty than normal. Needing to pee (urinate) more often than normal. Hunger. Feeling very tired. Blurry eyesight (vision). You may get other symptoms as the condition gets worse, such as: Dry mouth. Pain in your belly (abdomen). Not being hungry (loss of appetite). Breath that smells fruity. Weakness. Weight loss that is not planned. A tingling or numb feeling in your hands or feet. A headache. Cuts or bruises that heal slowly. How is this treated? Treatment depends on the cause of your condition. Treatment may include: Taking medicine to control your blood sugar levels. Changing your medicine or dosage if you take insulin or other diabetes medicines. Lifestyle changes. These may include: Exercising more. Eating healthier foods. Losing weight. Treating an illness or infection. Checking your blood sugar more often. Stopping or reducing steroid medicines. If your condition gets very bad, you will need to be treated in the hospital. Follow these instructions at home: General instructions Take over-the-counter and prescription medicines only as told by your doctor. Do not  smoke or use any products that contain nicotine or tobacco. If you need help quitting, ask your doctor. If you drink alcohol: Limit how much you have to: 0-1 drink a day for women who are not pregnant. 0-2 drinks a day for men. Know how much alcohol is in a drink. In the [place] one drink equals one 12 oz bottle of beer (355 mL), one 5 oz glass of wine (148 mL), or one 1 oz glass of hard liquor (44 mL). Manage stress. If you need help with this, ask your doctor. Do exercises as told by your doctor. Keep all follow-up visits. Eating and drinking  Stay at a healthy weight. Make sure you drink enough fluid when you: Exercise. Get sick. Are in hot temperatures. Drink enough fluid to keep your pee (urine) pale yellow. If you have diabetes:  Know the symptoms of high blood sugar. Follow your diabetes management plan as told by your doctor. Make sure you: Take insulin and medicines as told. Follow your exercise plan. Follow your meal plan. Eat on time. Do not skip meals. Check your blood sugar as often as told. Make sure you check before and after exercise. If you exercise longer or in a different way, check your blood sugar more often. Follow your sick day plan whenever you cannot eat or drink normally. Make this plan ahead of time with your doctor. Share your diabetes management plan with people in your workplace, school, and household. Check your pee for ketones when you are ill and as told by your doctor. Carry a card or wear jewelry that says that you have diabetes. Where to find more information American Diabetes Association: www.diabetes.org Contact a doctor if: Your blood sugar level is at or above 240 mg/dL (16.1 mmol/L) for 2 days in a row. You have problems keeping your blood sugar in your target range. You have high blood pressure often. You have signs of illness, such as: Feeling like you may vomit (feeling nauseous). Vomiting. A fever. Get help right away if: Your blood  sugar monitor reads "high" even when you are taking insulin. You have trouble breathing. You  have a change in how you think, feel, or act (mental status). You feel like you may vomit, and the feeling does not go away. You cannot stop vomiting. These symptoms may be an emergency. Get medical help right away. Call your local emergency services (911 in the [place] Do not wait to see if the symptoms will go away. Do not drive yourself to the hospital. Summary Hyperglycemia is when the sugar (glucose) level in your blood is too high. High blood sugar can happen to people who have or do not have diabetes. Make sure you drink enough fluids and follow your meal plan. Exercise as often as told by your doctor. Contact your doctor if you have problems keeping your blood sugar in your target range. This information is not intended to replace advice given to you by your health care provider. Make sure you discuss any questions you have with your health care provider. Document Revised: 10/30/2019 Document Reviewed: 10/31/2019 Elsevier Patient Education  2024 [employer].

## 2022-11-23 NOTE — Patient Outreach (Signed)
Care Management   Visit Note  11/23/2022 Name: Ashley Price MRN: [id number] DOB: [birth year]  Subjective: Ashley Price is a 67 y.o. year old female who is a primary care patient of Ashley Grandchild, MD. The Care Management team was consulted for assistance.      Engaged with patient spoke with patient by telephone.    Goals Addressed             This Visit's Progress    RNCM Care Plan for Effective Management of DM, PAD       Current Barriers:  Knowledge Deficits related to plan of care for management of DMII and Peripheral Arterial Diease  Chronic Disease Management support and education needs related to DMII and Peripheral Arterial Disease  Literacy barriers Cognitive Deficits  RNCM Clinical Goal(s):  Patient will verbalize understanding of plan for management of DMII and PAD as evidenced by maintaining a record of all blood sugars & being consistent with diet and exercise Take all medications exactly as prescribed and will call provider for medication related questions as evidenced by working with Pharm D & RNCM for effective management of disease process Demonstrate Improved and Ongoing health management independence as evidenced by lowering A1C and keeping log of blood sugars. Continue to work with Medical illustrator to address care management and care coordination needs related to  DMII and PAD as evidenced by adherence to CM Team Scheduled appointments Work with pharmacist to address literacy concerns and limited education about DM related to DMII as evidenced by review or EMR and patient or pharmacist report through collaboration with Medical illustrator, provider, and care team.   Interventions: Evaluation of current treatment plan related to  self management and patient's adherence to plan as established by provider   Diabetes Interventions:  (Status:  New goal.) Long Term Goal Assessed patient's understanding of A1c goal: <7% Provided education to patient about basic DM  disease process Reviewed medications with patient and discussed importance of medication adherence. Received incoming from daughter Ashley Price, [place] with questions about continuation of Metformin. RNCM advised that MD has discontinued Metformin and Farxgia and started insulin pen and freestyle Libre. RNCM advised that she could call back once she has obtained the medication and walkthrough of medication preparation/administration would be provided to patient/daughter.  Counseled on importance of regular laboratory monitoring as prescribed Discussed plans with patient for ongoing care management follow up and provided patient with direct contact information for care management team Provided patient with written educational materials related to hypo and hyperglycemia and importance of correct treatment Reviewed scheduled/upcoming provider appointments including: 11-27-2022 with Pharm D to discuss diabetes and new start of insulin Advised patient, providing education and rationale, to check cbg as directed and record, calling MD for findings outside established parameters Referral made to pharmacy team for assistance with DM Management Review of patient status, including review of consultants reports, relevant laboratory and other test results, and medications completed Screening for signs and symptoms of depression related to chronic disease state  Assessed social determinant of health barriers Lab Results  Component Value Date   HGBA1C 9.3 (H) 11/21/2022    PAD  (Status:  New goal. and Goal on track:  Yes.)  Long Term Goal Evaluation of current treatment plan related to  PAD , Literacy concerns and Limited education about PAD* self-management and patient's adherence to plan as established by provider. Discussed plans with patient for ongoing care management follow up and provided patient with direct contact  information for care management team Provided education to patient re: peripheral arterial diease  and need for continued exercise as tolerated Reviewed medications with patient and discussed medication adherence to treatment plan Provided patient with written educational materials related to Peripheral Arterial Disease Discussed plans with patient for ongoing care management follow up and provided patient with direct contact information for care management team Advised patient to discuss any findings such as numbness, tingling or wounds to her lower extremities with provider Screening for signs and symptoms of depression related to chronic disease state  Assessed social determinant of health barriers  Patient Goals/Self-Care Activities: Take all medications as prescribed Attend all scheduled provider appointments Call pharmacy for medication refills 3-7 days in advance of running out of medications Perform all self care activities independently  Call provider office for new concerns or questions  call the Suicide and Crisis Lifeline: 988 call the [place] National Suicide Prevention Lifeline: 509-068-8645 or TTY: 925-839-6400 TTY (336)151-0718) to talk to a trained counselor call 1-800-273-TALK (toll free, 24 hour hotline) go to [facility] [address] [place] [phone]) call 911 if experiencing a Mental Health or Behavioral Health Crisis  keep appointment with eye doctor check feet daily for cuts, sores or redness enter blood sugar readings and medication or insulin into daily log take the blood sugar log to all doctor visits keep a food diary manage portion size switch to sugar-free drinks wash and dry feet carefully every day  Follow Up Plan:  Telephone follow up appointment with care management team member scheduled for:  12-26-2022 at 10:30 am           Consent to Services:  Patient was given information about care management services, agreed to services, and gave verbal consent to participate.   Plan: Telephone follow up  appointment with care management team member scheduled for: 12-26-2022 at 10:30 am  Danise Edge, BSN RN RN Care Manager  [facility]  Ambulatory Care Management  Direct Number: [phone]

## 2022-11-26 ENCOUNTER — Other Ambulatory Visit: Payer: Self-pay | Admitting: Internal Medicine

## 2022-11-26 DIAGNOSIS — I10 Essential (primary) hypertension: Secondary | ICD-10-CM

## 2022-11-27 ENCOUNTER — Other Ambulatory Visit: Payer: Managed Care, Other (non HMO) | Admitting: Pharmacist

## 2022-11-27 DIAGNOSIS — E1151 Type 2 diabetes mellitus with diabetic peripheral angiopathy without gangrene: Secondary | ICD-10-CM

## 2022-11-27 MED ORDER — METFORMIN HCL ER 500 MG PO TB24: 1000.0000 mg | ORAL_TABLET | Freq: Two times a day (BID) | ORAL | 0 refills | Status: DC

## 2022-11-27 NOTE — Patient Instructions (Signed)
It was a pleasure speaking with you today!  Continue metformin 500 mg 1 tablet twice daily with food for 2 weeks total, then increase to metformin ER 500 mg 2 tablets twice daily with food.  Continue checking blood sugar twice daily and keep a log to review on our next call on 12/19/22.  Work on increasing protein and water intake along with reducing high sugar/starch food/drinks.  Feel free to call with any questions or concerns!  Arbutus Leas, PharmD, BCPS [facility] Clinical Pharmacist [facility] [phone]

## 2022-11-27 NOTE — Progress Notes (Signed)
11/27/2022 Name: Ashley Price MRN: [id number] DOB: 1955-06-30  Chief Complaint  Patient presents with   Diabetes   Medication Management    [name] is a 67 y.o. year old female who presented for a telephone visit. Spoke to patient and her daughter, Ashley Price.   They were referred to the pharmacist by their PCP for assistance in managing diabetes.    Subjective:  Care Team: Primary Care Provider: [name], MD ; Next Scheduled Visit: none scheduled   Medication Access/Adherence  Current Pharmacy:  [id number] - [place], [place] - [address] [place] [address] Phone: [phone] Fax: [phone]   Patient reports affordability concerns with their medications: No  Patient reports access/transportation concerns to their pharmacy: No  Patient reports adherence concerns with their medications:  Yes  Pt was out of metformin and Farxiga for about 2 weeks. She has metformin now after ED visit, but Marcelline Deist was discontinued.   Diabetes:  Current medications: metformin 500 mg 1 tablet twice daily *PCP sent in Rx for Touejo, however pt has not yet started. Wanted to wait for this appt. Medications tried in the past: Farxiga - pt unsure why it was stopped  Current glucose readings: 286-205 fasting, evening 216-233 (it was up to 500 per home glucometer the day she went to the ED) Using glucometer; testing 2 times daily   Current meal patterns:  - Notes she stopped drinking soda/juice after PCP appt last week/A1c result - Reports not great at drinking water - Reports no significant change in diet but does note weight gain of about 11 lbs  Current physical activity: limited due to arthritis in knees  Current medication access support: none, pt has Medicaid   Objective:  Lab Results  Component Value Date   HGBA1C 9.3 (H) 11/21/2022    Lab Results  Component Value Date   CREATININE 0.80 11/22/2022   BUN 12 11/22/2022   NA  137 11/22/2022   K 4.0 11/22/2022   CL 102 11/22/2022   CO2 18 (L) 11/21/2022    Lab Results  Component Value Date   CHOL 102 05/03/2022   HDL 42.50 05/03/2022   LDLCALC 42 05/03/2022   TRIG 90.0 05/03/2022   CHOLHDL 2 05/03/2022    Medications Reviewed Today     Reviewed by Bonita Quin, RPH (Pharmacist) on 11/27/22 at 1158  Med List Status: <None>   Medication Order Taking? Sig Documenting Provider Last Dose Status Informant  aspirin EC 81 MG tablet [id number] Yes Take 1 tablet (81 mg total) by mouth daily. Swallow whole. [name], MD Taking Active   atorvastatin (LIPITOR) 40 MG tablet [id number] Yes 1 TABLET AT 6 PM ORALLY ONCE A DAY 90 DAYS [name], MD Taking Active   Continuous Glucose Receiver (FREESTYLE LIBRE 3 READER) [place]VI [id number] No 1 Act by Does not apply route daily.  Patient not taking: Reported on 11/27/2022   [name], MD Not Taking Active   Continuous Glucose Sensor (FREESTYLE LIBRE 3 SENSOR) [place] [id number] No 1 Act by Does not apply route daily. Place 1 sensor on the skin every 14 days. Use to check glucose continuously  Patient not taking: Reported on 11/27/2022   [name], MD Not Taking Active   Cyanocobalamin (B-12 PO) [id number] Yes Take 1 tablet by mouth daily. [provider] Taking Active   Glucagon (GVOKE HYPOPEN 2-PACK) 1 MG/0.2ML SOAJ [id number]  Inject 1 Act into the skin daily  as needed. [name], MD  Active   insulin glargine, 1 Unit Dial, (TOUJEO SOLOSTAR) 300 UNIT/ML Solostar Pen [id number] No Inject 20 Units into the skin daily.  Patient not taking: Reported on 11/27/2022   [name], MD Not Taking Active   Insulin Pen Needle 32G X 6 MM MISC [id number] No 1 Act by Does not apply route once a week.  Patient not taking: Reported on 11/27/2022   [name], MD Not Taking Active   metFORMIN (GLUCOPHAGE) 500 MG tablet [id number] Yes Take 1 tablet (500 mg total) by mouth 2 (two) times daily with a  meal. Lorre Nick, MD Taking Active   spironolactone (ALDACTONE) 25 MG tablet [id number] Yes TAKE 1 TABLET (25 MG TOTAL) BY MOUTH DAILY. [name], MD Taking Active   UNKNOWN TO PATIENT [id number] Yes Take 1 tablet by mouth daily. 24 hour allergy medication [provider] Taking Active               Assessment/Plan:   Diabetes: - Currently uncontrolled, A1c goal <7%. Recent significant increase from previous A1cs - Reviewed dietary modifications including increase protein, reducing high sugar drinks/starchy foods, increasing hydration - Recommend to take metformin 500 mg 1 tablet twice daily x2 weeks then increase to metformin ER 500 mg 2 tablets twice daily - Recommend to hold off on insulin since BG are no longer in the 500s. If increased metformin does not lower BG to goal, consider GLP1 agonist.  - Recommend to check glucose twice daily - Believe Marcelline Deist was stopped due to trichomonas infection in the urine. Daughter states the pharmacy has not filled the antibiotic that was sent in. She plans to call today and will call me back if they are unable to fill.    Follow Up Plan: 11/19 telephone follow up  Arbutus Leas, PharmD, BCPS Clinical Pharmacist [facility] [phone]

## 2022-11-30 ENCOUNTER — Other Ambulatory Visit: Payer: Self-pay | Admitting: Acute Care

## 2022-11-30 DIAGNOSIS — F17210 Nicotine dependence, cigarettes, uncomplicated: Secondary | ICD-10-CM

## 2022-11-30 DIAGNOSIS — Z122 Encounter for screening for malignant neoplasm of respiratory organs: Secondary | ICD-10-CM

## 2022-11-30 DIAGNOSIS — Z87891 Personal history of nicotine dependence: Secondary | ICD-10-CM

## 2022-12-04 ENCOUNTER — Encounter (HOSPITAL_COMMUNITY): Payer: Self-pay | Admitting: Internal Medicine

## 2022-12-11 ENCOUNTER — Telehealth (HOSPITAL_COMMUNITY): Payer: Self-pay | Admitting: Internal Medicine

## 2022-12-11 NOTE — Telephone Encounter (Signed)
Just an FYI. We have made several attempts to contact this patient including sending a letter to schedule or reschedule their echocardiogram. We will be removing the patient from the echo WQ.   12/04/22 MAILED LETTER [facility]  11/30/22 [facility] x 3 to schedule @ 9:38/LBW  11/28/22 [facility] to schedule @ 7:55/LBW  11/23/22 [facility] to schedule @ 12:15/LBW (SMS SENT)        Thank you

## 2022-12-19 ENCOUNTER — Other Ambulatory Visit: Payer: Self-pay | Admitting: Internal Medicine

## 2022-12-19 ENCOUNTER — Other Ambulatory Visit: Payer: Managed Care, Other (non HMO) | Admitting: Pharmacist

## 2022-12-19 DIAGNOSIS — E1151 Type 2 diabetes mellitus with diabetic peripheral angiopathy without gangrene: Secondary | ICD-10-CM

## 2022-12-19 MED ORDER — OZEMPIC (0.25 OR 0.5 MG/DOSE) 2 MG/3ML ~~LOC~~ SOPN: PEN_INJECTOR | SUBCUTANEOUS | 0 refills | Status: DC

## 2022-12-19 MED ORDER — METFORMIN HCL ER 500 MG PO TB24: 1000.0000 mg | ORAL_TABLET | Freq: Two times a day (BID) | ORAL | 0 refills | Status: DC

## 2022-12-19 NOTE — Progress Notes (Signed)
   12/19/2022 Name: Ashley Price MRN: [id number] DOB: 1955-11-12  Chief Complaint  Patient presents with   Diabetes   Medication Management    KARRINGTON TURBYFILL is a 67 y.o. year old female who presented for a telephone visit. Spoke to patient and her daughter, [name].   They were referred to the pharmacist by their PCP for assistance in managing diabetes.     Subjective:  Care Team: Primary Care Provider: [name], MD ; Next Scheduled Visit: none scheduled   Medication Access/Adherence  Current Pharmacy:  [id number] - [place], [place] - [address] [place] [address] Phone: [phone] Fax: [phone]   Patient reports affordability concerns with their medications: No  Patient reports access/transportation concerns to their pharmacy: No  Patient reports adherence concerns with their medications:  Yes  Pt was out of metformin and Farxiga for about 2 weeks. She has metformin now after ED visit, but Marcelline Deist was discontinued.   Diabetes:  Current medications: metformin 500 mg 2 tablets twice daily Medications tried in the past: Farxiga - trichomonas infection  Current glucose readings: BG fasting 160-178, evening low 200s Improved from 286-205 fasting, evening 216-233 (it was up to 500 per home glucometer the day she went to the ED) Using glucometer; testing 2 times daily   Current meal patterns:  - Notes she stopped drinking soda/juice after PCP appt last week/A1c result - Reports not great at drinking water, has increased - Reports no significant change in diet but does note weight gain of about 11 lbs  Current physical activity: limited due to arthritis in knees  Current medication access support: none, pt has Medicaid   Objective:  Lab Results  Component Value Date   HGBA1C 9.3 (H) 11/21/2022    Lab Results  Component Value Date   CREATININE 0.80 11/22/2022   BUN 12 11/22/2022   NA 137 11/22/2022   K 4.0  11/22/2022   CL 102 11/22/2022   CO2 18 (L) 11/21/2022    Lab Results  Component Value Date   CHOL 102 05/03/2022   HDL 42.50 05/03/2022   LDLCALC 42 05/03/2022   TRIG 90.0 05/03/2022   CHOLHDL 2 05/03/2022    Medications Reviewed Today   Medications were not reviewed in this encounter       Assessment/Plan:   Diabetes: - Currently uncontrolled, A1c goal <7%. Recent significant increase from previous A1cs - Reviewed dietary modifications including increase protein, reducing high sugar drinks/starchy foods, increasing hydration - Recommend to continue metformin. Start Ozempic 0.25 mg weekly x4 weeks then 0.5 mg once weekly - Recommend to check glucose twice daily    Follow Up Plan: 12/17 telephone follow up  Arbutus Leas, PharmD, BCPS Clinical Pharmacist [facility] [phone]

## 2022-12-19 NOTE — Patient Instructions (Signed)
It was a pleasure speaking with you today!  Start ozempic 0.25 mg once weekly for 4 weeks then increase to 0.5 mg once weekly.  Continue checking blood sugars twice daily. Goal fasting is under 130. Goal for 2 hours after eating is less than 180.  Feel free to call with any questions or concerns!  Arbutus Leas, PharmD, BCPS [facility] Clinical Pharmacist [facility] [phone]

## 2022-12-26 ENCOUNTER — Other Ambulatory Visit: Payer: Self-pay | Admitting: *Deleted

## 2022-12-26 ENCOUNTER — Other Ambulatory Visit: Payer: Self-pay | Admitting: Internal Medicine

## 2022-12-26 DIAGNOSIS — I10 Essential (primary) hypertension: Secondary | ICD-10-CM

## 2022-12-26 DIAGNOSIS — I739 Peripheral vascular disease, unspecified: Secondary | ICD-10-CM

## 2022-12-26 DIAGNOSIS — E1151 Type 2 diabetes mellitus with diabetic peripheral angiopathy without gangrene: Secondary | ICD-10-CM

## 2022-12-26 NOTE — Patient Instructions (Signed)
Visit Information  Thank you for taking time to visit with me today. Please don't hesitate to contact me if I can be of assistance to you before our next scheduled telephone appointment.  Following are the goals we discussed today:   Goals Addressed             This Visit's Progress    RNCM Care Management Expected Outcomes: Monitor, Self-Manage and Reduce Symptoms of: DM, HTN       Current Barriers:  Knowledge Deficits related to plan of care for management of HTN and DMII  Chronic Disease Management support and education needs related to HTN and DMII   RNCM Clinical Goal(s):  Patient will verbalize basic understanding of  HTN and DMII disease process and self health management plan as evidenced by verbal explanation, recognizing symptoms, and lifestyle modifications take all medications exactly as prescribed and will call provider for medication related questions as evidenced by compliance with all medications attend all scheduled medical appointments: with primary care provider and specialist as evidenced by keeping all scheduled appointments demonstrate Improved and Ongoing adherence to prescribed treatment plan for HTN and DMII as evidenced by consistent medications compliance, symptom monitoring and continued lifestyle modifications continue to work with RN Care Manager to address care management and care coordination needs related to  HTN and DMII as evidenced by adherence to CM Team Scheduled appointments work with pharmacist to address Limited education about Diabetes* related toDMII as evidenced by review or EMR and patient or pharmacist report through collaboration with Medical illustrator, provider, and care team.   Interventions: Evaluation of current treatment plan related to  self management and patient's adherence to plan as established by provider   Diabetes Interventions:  (Status:  New goal. and Goal on track:  Yes.) Long Term Goal Assessed patient's understanding of A1c  goal: <7% Provided education to patient about basic DM disease process Reviewed medications with patient and discussed importance of medication adherence. Per daughter, patient Metformin was increased to 1000 mg daily. Per [name], she started the new dosage on 12-24-2022 and states her blood sugars were as follows. Day 1: AM-180 PM-160 Day 2: AM-166 PM-150 Day 3(today): AM 154. Daughter endorses that with the increased dose patient has had GI upset and patient had vomiting overnight and this morning. Patient was scheduled to start her Ozempic this morning but daughter states she is waiting until she feels better. RNCM advised to follow up with provider if GI symptoms worsened or if patient is unable to keep anything down. Counseled on importance of regular laboratory monitoring as prescribed Discussed plans with patient for ongoing care management follow up and provided patient with direct contact information for care management team Provided patient with written educational materials related to hypo and hyperglycemia and importance of correct treatment Reviewed scheduled/upcoming provider appointments including: Follow up with Pharm D for Diabetes Management on 01-16-2023 & Initial appointment with RD on 01-29-2023 for Diabetes Education/Nutritional Management Advised patient, providing education and rationale, to check cbg twice daily and record, calling provider for findings outside established parameters. Per daughter, she checks her moms blood sugar twice daily as advised with a fasting range of 160-180. Review of patient status, including review of consultants reports, relevant laboratory and other test results, and medications completed Lab Results  Component Value Date   HGBA1C 9.3 (H) 11/21/2022    Hypertension Interventions:  (Status:  New goal. and Goal on track:  Yes.) Long Term Goal Last practice recorded BP readings:  BP Readings from Last 3 Encounters:  11/22/22 115/60  11/21/22  102/68  09/05/22 137/83   Most recent eGFR/CrCl: No results found for: "EGFR"  No components found for: "CRCL"  Evaluation of current treatment plan related to hypertension self management and patient's adherence to plan as established by provider Provided education to patient re: stroke prevention, s/s of heart attack and stroke Reviewed medications with patient and discussed importance of compliance Counseled on adverse effects of illicit drug and excessive alcohol use in patients with high blood pressure  Counseled on the importance of exercise goals with target of 150 minutes per week. Per daughter, patient intermittently uses a stationary bike for approximately 10 minutes. She does state that patient has been in need of a bilateral knee replacement but patient refuses any intervention & has not followed up with that provider recently. Discussed plans with patient for ongoing care management follow up and provided patient with direct contact information for care management team Advised patient, providing education and rationale, to monitor blood pressure daily and record, calling PCP for findings outside established parameters. Per daughter, patient BP ranges from 120-130/60-80's. She does regularly check her mom's BP and does keep a log. Reviewed scheduled/upcoming provider appointments including: Has echo scheduled for 01-05-2023 Discussed complications of poorly controlled blood pressure such as heart disease, stroke, circulatory complications, vision complications, kidney impairment, sexual dysfunction  Patient Goals/Self-Care Activities: Take all medications as prescribed Attend all scheduled provider appointments Call pharmacy for medication refills 3-7 days in advance of running out of medications Perform all self care activities independently  Call provider office for new concerns or questions  schedule appointment with eye doctor check feet daily for cuts, sores or redness enter  blood sugar readings and medication or insulin into daily log check blood pressure daily write blood pressure results in a log or diary  Follow Up Plan:  Telephone follow up appointment with care management team member scheduled for:  01-26-2023 at 10:30 am           Our next appointment is by telephone on 01-26-2023 at 10:30 am  Please call the care guide team at [phone] if you need to cancel or reschedule your appointment.   If you are experiencing a Mental Health or Behavioral Health Crisis or need someone to talk to, please call the Suicide and Crisis Lifeline: 988 call the [place] National Suicide Prevention Lifeline: (661) 319-3943 or TTY: 5635122483 TTY 808 367 6989) to talk to a trained counselor call 1-800-273-TALK (toll free, 24 hour hotline) go to [facility] [address] [place] [phone])   Patient verbalizes understanding of instructions and care plan provided today and agrees to view in MyChart. Active MyChart status and patient understanding of how to access instructions and care plan via MyChart confirmed with patient.     Telephone follow up appointment with care management team member scheduled for:01-26-2023 at 10:30 am  Danise Edge, BSN RN RN Care Manager  [facility]  Ambulatory Care Management  Direct Number: [phone]

## 2022-12-26 NOTE — Patient Outreach (Signed)
Care Management   Visit Note  12/26/2022 Name: Ashley Price MRN: [id number] DOB: [birth year]  Subjective: Ashley Price is a 67 y.o. year old female who is a primary care patient of Ashley Grandchild, MD. The Care Management team was consulted for assistance.      Engaged with patient spoke with the family member (POA, [name], [place]). Per daughter, patient was asleep during today's outreach.   Goals Addressed             This Visit's Progress    RNCM Care Management Expected Outcomes: Monitor, Self-Manage and Reduce Symptoms of: DM, HTN       Current Barriers:  Knowledge Deficits related to plan of care for management of HTN and DMII  Chronic Disease Management support and education needs related to HTN and DMII   RNCM Clinical Goal(s):  Patient will verbalize basic understanding of  HTN and DMII disease process and self health management plan as evidenced by verbal explanation, recognizing symptoms, and lifestyle modifications take all medications exactly as prescribed and will call provider for medication related questions as evidenced by compliance with all medications attend all scheduled medical appointments: with primary care provider and specialist as evidenced by keeping all scheduled appointments demonstrate Improved and Ongoing adherence to prescribed treatment plan for HTN and DMII as evidenced by consistent medications compliance, symptom monitoring and continued lifestyle modifications continue to work with RN Care Manager to address care management and care coordination needs related to  HTN and DMII as evidenced by adherence to CM Team Scheduled appointments work with pharmacist to address Limited education about Diabetes* related toDMII as evidenced by review or EMR and patient or pharmacist report through collaboration with Medical illustrator, provider, and care team.   Interventions: Evaluation of current treatment plan related to  self management and patient's  adherence to plan as established by provider   Diabetes Interventions:  (Status:  New goal. and Goal on track:  Yes.) Long Term Goal Assessed patient's understanding of A1c goal: <7% Provided education to patient about basic DM disease process Reviewed medications with patient and discussed importance of medication adherence. Per daughter, patient Metformin was increased to 1000 mg daily. Per [name], she started the new dosage on 12-24-2022 and states her blood sugars were as follows. Day 1: AM-180 PM-160 Day 2: AM-166 PM-150 Day 3(today): AM 154. Daughter endorses that with the increased dose patient has had GI upset and patient had vomiting overnight and this morning. Patient was scheduled to start her Ozempic this morning but daughter states she is waiting until she feels better. RNCM advised to follow up with provider if GI symptoms worsened or if patient is unable to keep anything down. Counseled on importance of regular laboratory monitoring as prescribed Discussed plans with patient for ongoing care management follow up and provided patient with direct contact information for care management team Provided patient with written educational materials related to hypo and hyperglycemia and importance of correct treatment Reviewed scheduled/upcoming provider appointments including: Follow up with Pharm D for Diabetes Management on 01-16-2023 & Initial appointment with RD on 01-29-2023 for Diabetes Education/Nutritional Management Advised patient, providing education and rationale, to check cbg twice daily and record, calling provider for findings outside established parameters. Per daughter, she checks her moms blood sugar twice daily as advised with a fasting range of 160-180. Review of patient status, including review of consultants reports, relevant laboratory and other test results, and medications completed Lab Results  Component Value Date  HGBA1C 9.3 (H) 11/21/2022    Hypertension  Interventions:  (Status:  New goal. and Goal on track:  Yes.) Long Term Goal Last practice recorded BP readings:  BP Readings from Last 3 Encounters:  11/22/22 115/60  11/21/22 102/68  09/05/22 137/83   Most recent eGFR/CrCl: No results found for: "EGFR"  No components found for: "CRCL"  Evaluation of current treatment plan related to hypertension self management and patient's adherence to plan as established by provider Provided education to patient re: stroke prevention, s/s of heart attack and stroke Reviewed medications with patient and discussed importance of compliance Counseled on adverse effects of illicit drug and excessive alcohol use in patients with high blood pressure  Counseled on the importance of exercise goals with target of 150 minutes per week. Per daughter, patient intermittently uses a stationary bike for approximately 10 minutes. She does state that patient has been in need of a bilateral knee replacement but patient refuses any intervention & has not followed up with that provider recently. Discussed plans with patient for ongoing care management follow up and provided patient with direct contact information for care management team Advised patient, providing education and rationale, to monitor blood pressure daily and record, calling PCP for findings outside established parameters. Per daughter, patient BP ranges from 120-130/60-80's. She does regularly check her mom's BP and does keep a log. Reviewed scheduled/upcoming provider appointments including: Has echo scheduled for 01-05-2023 Discussed complications of poorly controlled blood pressure such as heart disease, stroke, circulatory complications, vision complications, kidney impairment, sexual dysfunction  Patient Goals/Self-Care Activities: Take all medications as prescribed Attend all scheduled provider appointments Call pharmacy for medication refills 3-7 days in advance of running out of medications Perform all  self care activities independently  Call provider office for new concerns or questions  schedule appointment with eye doctor check feet daily for cuts, sores or redness enter blood sugar readings and medication or insulin into daily log check blood pressure daily write blood pressure results in a log or diary  Follow Up Plan:  Telephone follow up appointment with care management team member scheduled for:  01-26-2023 at 10:30 am           Consent to Services:  Patient was given information about care management services, agreed to services, and gave verbal consent to participate.   Plan: Telephone follow up appointment with care management team member scheduled for: 01-26-2023 at 10:30 am  Danise Edge, BSN RN RN Care Manager  [facility]  Ambulatory Care Management  Direct Number: [phone]

## 2022-12-31 ENCOUNTER — Encounter: Payer: Self-pay | Admitting: Internal Medicine

## 2023-01-02 DIAGNOSIS — Z09 Encounter for follow-up examination after completed treatment for conditions other than malignant neoplasm: Secondary | ICD-10-CM | POA: Diagnosis not present

## 2023-01-02 DIAGNOSIS — K621 Rectal polyp: Secondary | ICD-10-CM | POA: Diagnosis not present

## 2023-01-02 DIAGNOSIS — D122 Benign neoplasm of ascending colon: Secondary | ICD-10-CM | POA: Diagnosis not present

## 2023-01-02 DIAGNOSIS — Z860101 Personal history of adenomatous and serrated colon polyps: Secondary | ICD-10-CM | POA: Diagnosis not present

## 2023-01-02 LAB — HM COLONOSCOPY

## 2023-01-04 DIAGNOSIS — D122 Benign neoplasm of ascending colon: Secondary | ICD-10-CM | POA: Diagnosis not present

## 2023-01-04 DIAGNOSIS — K621 Rectal polyp: Secondary | ICD-10-CM | POA: Diagnosis not present

## 2023-01-05 ENCOUNTER — Ambulatory Visit (HOSPITAL_COMMUNITY)
Admission: RE | Admit: 2023-01-05 | Discharge: 2023-01-05 | Disposition: A | Discharge status: Home / Self Care | Payer: Managed Care, Other (non HMO) | Source: Ambulatory Visit | Attending: Cardiology | Admitting: Cardiology

## 2023-01-05 DIAGNOSIS — R9431 Abnormal electrocardiogram [ECG] [EKG]: Secondary | ICD-10-CM | POA: Diagnosis not present

## 2023-01-05 LAB — ECHOCARDIOGRAM COMPLETE
AR max vel: 2.23 cm2
AV Area VTI: 2.3 cm2
AV Area mean vel: 2.22 cm2
AV Mean grad: 3 mm[Hg]
AV Peak grad: 4.9 mm[Hg]
Ao pk vel: 1.11 m/s
Area-P 1/2: 2.99 cm2
S' Lateral: 2.91 cm

## 2023-01-06 ENCOUNTER — Encounter: Payer: Self-pay | Admitting: Internal Medicine

## 2023-01-08 ENCOUNTER — Ambulatory Visit
Admission: RE | Admit: 2023-01-08 | Discharge: 2023-01-08 | Disposition: A | Discharge status: Home / Self Care | Payer: Managed Care, Other (non HMO) | Source: Ambulatory Visit | Attending: Acute Care | Admitting: Acute Care

## 2023-01-08 ENCOUNTER — Other Ambulatory Visit: Payer: Self-pay | Admitting: Internal Medicine

## 2023-01-08 ENCOUNTER — Ambulatory Visit (HOSPITAL_COMMUNITY): Payer: Managed Care, Other (non HMO)

## 2023-01-08 DIAGNOSIS — Z87891 Personal history of nicotine dependence: Secondary | ICD-10-CM

## 2023-01-08 DIAGNOSIS — Z122 Encounter for screening for malignant neoplasm of respiratory organs: Secondary | ICD-10-CM

## 2023-01-08 DIAGNOSIS — I10 Essential (primary) hypertension: Secondary | ICD-10-CM

## 2023-01-08 DIAGNOSIS — F17210 Nicotine dependence, cigarettes, uncomplicated: Secondary | ICD-10-CM

## 2023-01-08 DIAGNOSIS — I5189 Other ill-defined heart diseases: Secondary | ICD-10-CM

## 2023-01-08 MED ORDER — AMLODIPINE BESYLATE 5 MG PO TABS: 5.0000 mg | ORAL_TABLET | Freq: Every day | ORAL | 0 refills | Status: DC

## 2023-01-15 ENCOUNTER — Other Ambulatory Visit: Payer: Self-pay | Admitting: Internal Medicine

## 2023-01-15 DIAGNOSIS — E1151 Type 2 diabetes mellitus with diabetic peripheral angiopathy without gangrene: Secondary | ICD-10-CM

## 2023-01-15 MED ORDER — OZEMPIC (0.25 OR 0.5 MG/DOSE) 2 MG/3ML ~~LOC~~ SOPN: 0.5000 mg | PEN_INJECTOR | SUBCUTANEOUS | 0 refills | Status: DC

## 2023-01-16 ENCOUNTER — Other Ambulatory Visit: Payer: Managed Care, Other (non HMO) | Admitting: Pharmacist

## 2023-01-16 DIAGNOSIS — E1151 Type 2 diabetes mellitus with diabetic peripheral angiopathy without gangrene: Secondary | ICD-10-CM

## 2023-01-16 DIAGNOSIS — I10 Essential (primary) hypertension: Secondary | ICD-10-CM

## 2023-01-16 MED ORDER — OZEMPIC (0.25 OR 0.5 MG/DOSE) 2 MG/3ML ~~LOC~~ SOPN: 0.2500 mg | PEN_INJECTOR | SUBCUTANEOUS | Status: DC

## 2023-01-16 NOTE — Progress Notes (Signed)
01/16/2023 Name: Ashley Price MRN: [id number] DOB: [birth year]  Chief Complaint  Patient presents with   Diabetes   Medication Management    Ashley Price is a 67 y.o. year old female who presented for a telephone visit. Spoke to patient and her daughter, Ashley Price.   They were referred to the pharmacist by their PCP for assistance in managing diabetes.        Subjective:  Care Team: Primary Care Provider: [name], MD ; Next Scheduled Visit: none scheduled   Medication Access/Adherence  Current Pharmacy:  [id number] - [place], [place] - [address] [place] [place] [address] Phone: [phone] Fax: [phone]   Patient reports affordability concerns with their medications: No  Patient reports access/transportation concerns to their pharmacy: No  Patient reports adherence concerns with their medications:  No     Diabetes:  Current medications: metformin 500 mg 1 tablets twice daily, Ozempic 0.25 mg weekly Medications tried in the past: [place] - trichomonas infection  Daughter states she decreased metformin 1 tab twice daily because patient has had vomiting about every 2-3 days. She feels it is from the metformin with Ozempic as opposed to Ozempic alone.  Current glucose readings: BG 120-150, some improvement from BG fasting 160-178 prior to Ozempic start. Using glucometer; testing 2 times daily   Current meal patterns:  - Notes she does not eat a lot in one sitting, tends to graze/snack - Notes she stopped drinking soda/juice after PCP appt last week/A1c result - Reports not great at drinking water, has increased - Reports no significant change in diet but does note weight gain of about 11 lbs  Current physical activity: limited due to arthritis in knees  Current medication access support: none, pt has Medicaid  Hypertension: Current medications: spironolactone 25 mg daily, amlodipine 5 mg daily (started 12/9) Previous  Medications: lisinopril was stopped due to concern for hypotension at October PCP appt  Daughter was not aware lisinopril was stopped, pt had continued taking until refill request was recently denied. She noted BP was elevated and PCP sent in amlodipine. Home BP readings:  BP previously around 129/90 while on lisinopril BP was up to 154/102 when off of lisinopril BP has been 147/96, 137/92 on amlodipine   Objective:  Lab Results  Component Value Date   HGBA1C 9.3 (H) 11/21/2022    Lab Results  Component Value Date   CREATININE 0.80 11/22/2022   BUN 12 11/22/2022   NA 137 11/22/2022   K 4.0 11/22/2022   CL 102 11/22/2022   CO2 18 (L) 11/21/2022    Lab Results  Component Value Date   CHOL 102 05/03/2022   HDL 42.50 05/03/2022   LDLCALC 42 05/03/2022   TRIG 90.0 05/03/2022   CHOLHDL 2 05/03/2022    Medications Reviewed Today     Reviewed by Ashley Price, RPH (Pharmacist) on 01/16/23 at 1051  Med List Status: <None>   Medication Order Taking? Sig Documenting Provider Last Dose Status Informant  amLODipine (NORVASC) 5 MG tablet [id number] Yes Take 1 tablet (5 mg total) by mouth daily. [name], MD Taking Active   aspirin EC 81 MG tablet [id number]  Take 1 tablet (81 mg total) by mouth daily. Swallow whole. [name], MD  Active   atorvastatin (LIPITOR) 40 MG tablet [id number] Yes 1 TABLET AT 6 PM ORALLY ONCE A DAY 90 DAYS [name], MD Taking Active   Continuous Glucose Receiver (FREESTYLE LIBRE 3  READER) DEVI [id number]  1 Act by Does not apply route daily.  Patient not taking: Reported on 11/27/2022   [name], MD  Active   Continuous Glucose Sensor (FREESTYLE LIBRE 3 [place]) [place] [id number]  1 Act by Does not apply route daily. Place 1 sensor on the skin every 14 days. Use to check glucose continuously  Patient not taking: Reported on 11/27/2022   [name], MD  Active   Cyanocobalamin (B-12 PO) [id number]  Take 1 tablet by mouth daily.  [provider]  Active   Glucagon (GVOKE HYPOPEN 2-PACK) 1 MG/0.2ML SOAJ [id number]  Inject 1 Act into the skin daily as needed. [name], MD  Active   metFORMIN (GLUCOPHAGE-XR) 500 MG 24 hr tablet [id number] Yes Take 2 tablets (1,000 mg total) by mouth 2 (two) times daily with a meal.  Patient taking differently: Take 1,000 mg by mouth in the morning and at bedtime.   [name], MD Taking Active   Semaglutide,0.25 or 0.5MG /DOS, (OZEMPIC, 0.25 OR 0.5 MG/DOSE,) 2 MG/3ML SOPN [id number] Yes Inject 0.5 mg into the skin once a week. Inject 0.25 mg SQ once weekly x4 weeks then increase to 0.5 mg SQ once weekly [name], MD Taking Active   spironolactone (ALDACTONE) 25 MG tablet [id number] Yes TAKE 1 TABLET (25 MG TOTAL) BY MOUTH DAILY. [name], MD Taking Active   UNKNOWN TO PATIENT [id number]  Take 1 tablet by mouth daily. 24 hour allergy medication [provider]  Active               Assessment/Plan:   Diabetes: - Currently uncontrolled, A1c goal <7%. Recent significant increase from previous A1c - Suspect nausea is due to Ozempic - Recommend to stop metformin. Continue Ozempic 0.25 mg weekly to see if nausea improves or resolves. Will f/u in 2 weeks. Could try Trulicity if vomiting does not resolve - Recommend to check glucose twice daily  Hypertension: - Currently uncontrolled. BP goal <130/80. - Continue current regimen and monitor BP at home.  - If remains elevated, may need to increase amlodipine or add back lisinopril  Follow Up Plan: 2 week follow up  Arbutus Leas, PharmD, BCPS Clinical Pharmacist [facility] [phone]

## 2023-01-16 NOTE — Patient Instructions (Signed)
It was a pleasure speaking with you today!  Stop metformin and continue Ozempic 0.25 mg once weekly. We will see if vomiting resolves without metformin.   Keep monitoring blood pressure at home.  I will call back in 2 weeks to see how the vomiting is doing and how the blood pressures look.  Feel free to call with any questions or concerns!  Arbutus Leas, PharmD, BCPS [facility] Clinical Pharmacist [facility] [phone]

## 2023-01-22 ENCOUNTER — Other Ambulatory Visit: Payer: Self-pay

## 2023-01-22 DIAGNOSIS — Z122 Encounter for screening for malignant neoplasm of respiratory organs: Secondary | ICD-10-CM

## 2023-01-22 DIAGNOSIS — Z87891 Personal history of nicotine dependence: Secondary | ICD-10-CM

## 2023-01-24 ENCOUNTER — Other Ambulatory Visit: Payer: Self-pay | Admitting: Internal Medicine

## 2023-01-24 DIAGNOSIS — E785 Hyperlipidemia, unspecified: Secondary | ICD-10-CM

## 2023-01-26 ENCOUNTER — Other Ambulatory Visit: Payer: Self-pay | Admitting: *Deleted

## 2023-01-26 ENCOUNTER — Other Ambulatory Visit: Payer: Self-pay | Admitting: Internal Medicine

## 2023-01-26 DIAGNOSIS — E785 Hyperlipidemia, unspecified: Secondary | ICD-10-CM

## 2023-01-26 NOTE — Patient Outreach (Signed)
Care Management   Visit Note  01/26/2023 Name: Ashley Price MRN: [id number] DOB: [birth year]  Subjective: Ashley Price is a 67 y.o. year old female who is a primary care patient of Ashley Grandchild, MD. The Care Management team was consulted for assistance.      Engaged with patient spoke with the family member (POA, Guardian, [place]) and patient via speaker phone.    Goals Addressed             This Visit's Progress    RNCM Care Management Expected Outcomes: Monitor, Self-Manage and Reduce Symptoms of: DM, HTN       Current Barriers:  Knowledge Deficits related to plan of care for management of HTN and DMII  Chronic Disease Management support and education needs related to HTN and DMII   RNCM Clinical Goal(s):  Patient will verbalize basic understanding of  HTN and DMII disease process and self health management plan as evidenced by verbal explanation, recognizing symptoms, and lifestyle modifications take all medications exactly as prescribed and will call provider for medication related questions as evidenced by compliance with all medications attend all scheduled medical appointments: with primary care provider and specialist as evidenced by keeping all scheduled appointments demonstrate Improved and Ongoing adherence to prescribed treatment plan for HTN and DMII as evidenced by consistent medications compliance, symptom monitoring and continued lifestyle modifications continue to work with RN Care Manager to address care management and care coordination needs related to  HTN and DMII as evidenced by adherence to CM Team Scheduled appointments work with pharmacist to address Limited education about Diabetes* related toDMII as evidenced by review or EMR and patient or pharmacist report through collaboration with Medical illustrator, provider, and care team.   Interventions: Evaluation of current treatment plan related to  self management and patient's adherence to plan as established by  provider   Diabetes Interventions:  (Status:  New goal. and Goal on track:  Yes.) Long Term Goal Assessed patient's understanding of A1c goal: <7% Provided education to patient about basic DM disease process Reviewed medications with patient and discussed importance of medication adherence. Per daughter Ashley Price, she was advised by the pharmacist to discontinue Metformin due to continued GI upset.  Counseled on importance of regular laboratory monitoring as prescribed Discussed plans with patient for ongoing care management follow up and provided patient with direct contact information for care management team Provided patient with written educational materials related to hypo and hyperglycemia and importance of correct treatment Reviewed scheduled/upcoming provider appointments including: Follow up with Pharm D for Diabetes Management on 01-29-2023 & Initial appointment with RD on 01-29-2023 for Diabetes Education/Nutritional Management Advised patient, providing education and rationale, to check cbg twice daily and record, calling provider for findings outside established parameters. Per daughter, she checks her moms blood sugar daily and occasionally twice daily. She reports this morning fasting was 168. She reports the lowest fasting seen was 112 and the highest being 168. RNCM reviewed fasting goal of <130 and post prandial of <180. Review of patient status, including review of consultants reports, relevant laboratory and other test results, and medications completed Lab Results  Component Value Date   HGBA1C 9.3 (H) 11/21/2022    Hypertension Interventions:  (Status:  New goal. and Goal on track:  Yes.) Long Term Goal Last practice recorded BP readings:  BP Readings from Last 3 Encounters:  11/22/22 115/60  11/21/22 102/68  09/05/22 137/83   Most recent eGFR/CrCl: No results found for: "EGFR"  No components found for: "CRCL"  Evaluation of current treatment plan related to hypertension  self management and patient's adherence to plan as established by provider Provided education to patient re: stroke prevention, s/s of heart attack and stroke Reviewed medications with patient and discussed importance of compliance Counseled on adverse effects of illicit drug and excessive alcohol use in patients with high blood pressure  Counseled on the importance of exercise goals with target of 150 minutes per week.  Discussed plans with patient for ongoing care management follow up and provided patient with direct contact information for care management team Advised patient, providing education and rationale, to monitor blood pressure daily and record, calling PCP for findings outside established parameters. Per daughter and chart review, Lisinopril was discontinued and changed to Amlodipine. Daughter reports that she has had some elevated readings since the switch. She reports the highest reading 147/96. Reported reading today of 128/98. RNCM reviewed goal of systolic being <140 and diastolic <90. Reviewed scheduled/upcoming provider appointments including: Follow up with Pharm D for Diabetes Management on 01-29-2023 & Initial appointment with RD on 01-29-2023 for Diabetes Education/Nutritional Management Discussed complications of poorly controlled blood pressure such as heart disease, stroke, circulatory complications, vision complications, kidney impairment, sexual dysfunction  Patient Goals/Self-Care Activities: Take all medications as prescribed Attend all scheduled provider appointments Call pharmacy for medication refills 3-7 days in advance of running out of medications Perform all self care activities independently  Call provider office for new concerns or questions  schedule appointment with eye doctor check feet daily for cuts, sores or redness enter blood sugar readings and medication or insulin into daily log check blood pressure daily write blood pressure results in a log or  diary  Follow Up Plan:  Telephone follow up appointment with care management team member scheduled for:  03-01-2023 at 10:00 am              Consent to Services:  Patient was given information about care management services, agreed to services, and gave verbal consent to participate.   Plan: Telephone follow up appointment with care management team member scheduled for:03-01-2023 at 10:00 am  Danise Edge, BSN RN RN Care Manager  [facility]  Ambulatory Care Management  Direct Number: [phone]

## 2023-01-26 NOTE — Patient Instructions (Signed)
Visit Information  Thank you for taking time to visit with me today. Please don't hesitate to contact me if I can be of assistance to you before our next scheduled telephone appointment.  Following are the goals we discussed today:   Goals Addressed             This Visit's Progress    RNCM Care Management Expected Outcomes: Monitor, Self-Manage and Reduce Symptoms of: DM, HTN       Current Barriers:  Knowledge Deficits related to plan of care for management of HTN and DMII  Chronic Disease Management support and education needs related to HTN and DMII   RNCM Clinical Goal(s):  Patient will verbalize basic understanding of  HTN and DMII disease process and self health management plan as evidenced by verbal explanation, recognizing symptoms, and lifestyle modifications take all medications exactly as prescribed and will call provider for medication related questions as evidenced by compliance with all medications attend all scheduled medical appointments: with primary care provider and specialist as evidenced by keeping all scheduled appointments demonstrate Improved and Ongoing adherence to prescribed treatment plan for HTN and DMII as evidenced by consistent medications compliance, symptom monitoring and continued lifestyle modifications continue to work with RN Care Manager to address care management and care coordination needs related to  HTN and DMII as evidenced by adherence to CM Team Scheduled appointments work with pharmacist to address Limited education about Diabetes* related toDMII as evidenced by review or EMR and patient or pharmacist report through collaboration with Medical illustrator, provider, and care team.   Interventions: Evaluation of current treatment plan related to  self management and patient's adherence to plan as established by provider   Diabetes Interventions:  (Status:  New goal. and Goal on track:  Yes.) Long Term Goal Assessed patient's understanding of A1c  goal: <7% Provided education to patient about basic DM disease process Reviewed medications with patient and discussed importance of medication adherence. Per daughter [name], she was advised by the pharmacist to discontinue Metformin due to continued GI upset.  Counseled on importance of regular laboratory monitoring as prescribed Discussed plans with patient for ongoing care management follow up and provided patient with direct contact information for care management team Provided patient with written educational materials related to hypo and hyperglycemia and importance of correct treatment Reviewed scheduled/upcoming provider appointments including: Follow up with Pharm D for Diabetes Management on 01-29-2023 & Initial appointment with RD on 01-29-2023 for Diabetes Education/Nutritional Management Advised patient, providing education and rationale, to check cbg twice daily and record, calling provider for findings outside established parameters. Per daughter, she checks her moms blood sugar daily and occasionally twice daily. She reports this morning fasting was 168. She reports the lowest fasting seen was 112 and the highest being 168. RNCM reviewed fasting goal of <130 and post prandial of <180. Review of patient status, including review of consultants reports, relevant laboratory and other test results, and medications completed Lab Results  Component Value Date   HGBA1C 9.3 (H) 11/21/2022    Hypertension Interventions:  (Status:  New goal. and Goal on track:  Yes.) Long Term Goal Last practice recorded BP readings:  BP Readings from Last 3 Encounters:  11/22/22 115/60  11/21/22 102/68  09/05/22 137/83   Most recent eGFR/CrCl: No results found for: "EGFR"  No components found for: "CRCL"  Evaluation of current treatment plan related to hypertension self management and patient's adherence to plan as established by provider Provided education to  patient re: stroke prevention, s/s of  heart attack and stroke Reviewed medications with patient and discussed importance of compliance Counseled on adverse effects of illicit drug and excessive alcohol use in patients with high blood pressure  Counseled on the importance of exercise goals with target of 150 minutes per week.  Discussed plans with patient for ongoing care management follow up and provided patient with direct contact information for care management team Advised patient, providing education and rationale, to monitor blood pressure daily and record, calling PCP for findings outside established parameters. Per daughter and chart review, Lisinopril was discontinued and changed to Amlodipine. Daughter reports that she has had some elevated readings since the switch. She reports the highest reading 147/96. Reported reading today of 128/98. RNCM reviewed goal of systolic being <140 and diastolic <90. Reviewed scheduled/upcoming provider appointments including: Follow up with Pharm D for Diabetes Management on 01-29-2023 & Initial appointment with RD on 01-29-2023 for Diabetes Education/Nutritional Management Discussed complications of poorly controlled blood pressure such as heart disease, stroke, circulatory complications, vision complications, kidney impairment, sexual dysfunction  Patient Goals/Self-Care Activities: Take all medications as prescribed Attend all scheduled provider appointments Call pharmacy for medication refills 3-7 days in advance of running out of medications Perform all self care activities independently  Call provider office for new concerns or questions  schedule appointment with eye doctor check feet daily for cuts, sores or redness enter blood sugar readings and medication or insulin into daily log check blood pressure daily write blood pressure results in a log or diary  Follow Up Plan:  Telephone follow up appointment with care management team member scheduled for:  03-01-2023 at 10:00 am            Our next appointment is by telephone on 03-01-2023 at 10:00 am  Please call the care guide team at [phone] if you need to cancel or reschedule your appointment.   If you are experiencing a Mental Health or Behavioral Health Crisis or need someone to talk to, please call the Suicide and Crisis Lifeline: 988 call the [place] National Suicide Prevention Lifeline: 320-619-4377 or TTY: 605-854-8422 TTY 334 032 5654) to talk to a trained counselor call 1-800-273-TALK (toll free, 24 hour hotline) go to [facility] [address] [place] ([phone])   Patient verbalizes understanding of instructions and care plan provided today and agrees to view in MyChart. Active MyChart status and patient understanding of how to access instructions and care plan via MyChart confirmed with patient.     Telephone follow up appointment with care management team member scheduled for:03-01-2023 at 10:00 am  Danise Edge, BSN RN RN Care Manager  [facility]  Ambulatory Care Management  Direct Number: [phone]

## 2023-01-29 ENCOUNTER — Encounter: Payer: 59 | Attending: Internal Medicine | Admitting: Dietician

## 2023-01-29 ENCOUNTER — Other Ambulatory Visit (INDEPENDENT_AMBULATORY_CARE_PROVIDER_SITE_OTHER): Payer: Managed Care, Other (non HMO) | Admitting: Pharmacist

## 2023-01-29 DIAGNOSIS — Z7985 Long-term (current) use of injectable non-insulin antidiabetic drugs: Secondary | ICD-10-CM

## 2023-01-29 DIAGNOSIS — E1151 Type 2 diabetes mellitus with diabetic peripheral angiopathy without gangrene: Secondary | ICD-10-CM

## 2023-01-29 DIAGNOSIS — Z794 Long term (current) use of insulin: Secondary | ICD-10-CM | POA: Diagnosis not present

## 2023-01-29 DIAGNOSIS — E119 Type 2 diabetes mellitus without complications: Secondary | ICD-10-CM | POA: Diagnosis not present

## 2023-01-29 DIAGNOSIS — E785 Hyperlipidemia, unspecified: Secondary | ICD-10-CM

## 2023-01-29 MED ORDER — ATORVASTATIN CALCIUM 40 MG PO TABS: 40.0000 mg | ORAL_TABLET | Freq: Every day | ORAL | 0 refills | Status: DC

## 2023-01-29 MED ORDER — OZEMPIC (0.25 OR 0.5 MG/DOSE) 2 MG/3ML ~~LOC~~ SOPN: 0.5000 mg | PEN_INJECTOR | SUBCUTANEOUS | 1 refills | Status: DC

## 2023-01-29 NOTE — Progress Notes (Signed)
01/29/2023 Name: Ashley Price MRN: [id number] DOB: Jan 18, 1956  Chief Complaint  Patient presents with   Diabetes   Medication Management    Ashley Price is a 67 y.o. year old female who presented for a telephone visit. Spoke to patient and her daughter, Ashley Price.   They were referred to the pharmacist by their PCP for assistance in managing diabetes.    Subjective:  Care Team: Primary Care Provider: [name], MD ; Next Scheduled Visit: none scheduled   Medication Access/Adherence  Current Pharmacy:  [id number] - [place], [place] - [address] [place] [address] Phone: [phone] Fax: [phone]   Patient reports affordability concerns with their medications: No  Patient reports access/transportation concerns to their pharmacy: No  Patient reports adherence concerns with their medications:  No     Diabetes:  Current medications: Ozempic 0.25 mg weekly Medications tried in the past: Farxiga - trichomonas infection, metformin (N/V when taking with Ozempic)  Current glucose readings: 112-168 fasting and post prandial, highest 212, some improvement from BG fasting 160-178 prior to Ozempic start.  Using glucometer; testing 2 times daily   Current meal patterns:  - Notes she does not eat a lot in one sitting, tends to graze/snack - Notes she stopped drinking soda/juice after PCP appt last week/A1c result - Reports not great at drinking water, has increased - Reports no significant change in diet but does note weight gain of about 11 lbs  Current physical activity: limited due to arthritis in knees  Current medication access support: none, pt has Medicaid  Hypertension: Current medications: spironolactone 25 mg daily, amlodipine 5 mg daily (started 12/9) Previous Medications: lisinopril was stopped due to concern for hypotension at October PCP appt  Home BP readings:  BP previously around 129/90 while on lisinopril BP  was up to 154/102 when off of lisinopril BP has been 147/96, 137/92, 136/87 on amlodipine  Objective:  Lab Results  Component Value Date   HGBA1C 9.3 (H) 11/21/2022    Lab Results  Component Value Date   CREATININE 0.80 11/22/2022   BUN 12 11/22/2022   NA 137 11/22/2022   K 4.0 11/22/2022   CL 102 11/22/2022   CO2 18 (L) 11/21/2022    Lab Results  Component Value Date   CHOL 102 05/03/2022   HDL 42.50 05/03/2022   LDLCALC 42 05/03/2022   TRIG 90.0 05/03/2022   CHOLHDL 2 05/03/2022    Medications Reviewed Today     Reviewed by Ashley Price, RPH (Pharmacist) on 01/29/23 at 0934  Med List Status: <None>   Medication Order Taking? Sig Documenting Provider Last Dose Status Informant  amLODipine (NORVASC) 5 MG tablet [id number] Yes Take 1 tablet (5 mg total) by mouth daily. Ashley Grandchild, MD Taking Active   aspirin EC 81 MG tablet [id number]  Take 1 tablet (81 mg total) by mouth daily. Swallow whole. Ashley Grandchild, MD  Active   atorvastatin (LIPITOR) 40 MG tablet [id number]  1 TABLET AT 6 PM ORALLY ONCE A DAY 90 DAYS Ashley Grandchild, MD  Active   Continuous Glucose Receiver (FREESTYLE LIBRE 3 READER) DEVI [id number]  1 Act by Does not apply route daily.  Patient not taking: Reported on 11/27/2022   Ashley Grandchild, MD  Active   Continuous Glucose Sensor (FREESTYLE LIBRE 3 [place]) [place] [id number]  1 Act by Does not apply route daily. Place 1 sensor on the skin every 14 days. Use to check  glucose continuously  Patient not taking: Reported on 11/27/2022   Ashley Grandchild, MD  Active   Cyanocobalamin (B-12 PO) [id number]  Take 1 tablet by mouth daily. [provider]  Active   Glucagon (GVOKE HYPOPEN 2-PACK) 1 MG/0.2ML SOAJ [id number]  Inject 1 Act into the skin daily as needed. Ashley Grandchild, MD  Active   Semaglutide,0.25 or 0.5MG /DOS, (OZEMPIC, 0.25 OR 0.5 MG/DOSE,) 2 MG/3ML SOPN [id number]  Inject 0.5 mg into the skin once a week. Ashley Grandchild, MD  Active    spironolactone (ALDACTONE) 25 MG tablet [id number] Yes TAKE 1 TABLET (25 MG TOTAL) BY MOUTH DAILY. Ashley Grandchild, MD Taking Active   UNKNOWN TO PATIENT [id number]  Take 1 tablet by mouth daily. 24 hour allergy medication [provider]  Active               Assessment/Plan:   Diabetes: - Currently uncontrolled, A1c goal <7%. Recent significant increase from previous A1c - Increase Ozempic to 0.5 mg weekly. Will f/u in 2 weeks. Could try Trulicity if vomiting does not resolve - Recommend to check glucose twice daily  Hypertension: - Currently uncontrolled. BP goal <130/80. - Continue current regimen and monitor BP at home.  - If remains elevated, may need to increase amlodipine or add back lisinopril  Follow Up Plan: 2 week follow up  Ashley Price, PharmD, BCPS Clinical Pharmacist [facility] [phone]

## 2023-01-29 NOTE — Progress Notes (Signed)
Diabetes Self-Management Education  Visit Type: First/Initial  Appt. Start Time: 1128 Appt. End Time: 1228  01/29/2023  [name], identified by name and date of birth, is a 67 y.o. female with a diagnosis of Diabetes: Type 2.   ASSESSMENT  Patient is here today with her daughter [name]. Patient and daughter would like to learn more about dietary intake. Patient lives with her two daughters and their adult children.  Pt's daughters do shopping and Pt and daughter share cooking. Pt reports knee pain and states a decrease in walking since this summer. Pt's daughter states recent blood sugar obtained before and after meals range "112-170" and states improvement. Pt's daughter states making the following changes including switching to coke zero and selecting low calorie bread. Pt reports she tried stevia and did not like the taste and is open to trying other products. Pt's daughter states Pt does not drink enough water. Pt reports a decline in overall appetite since starting weekly GLP. All Pt's questions were answered during this encounter.    History includes:   Past Medical History:  Diagnosis Date   Hypertension    Osteoarthritis    Stroke [facility])     Labs noted:   Lab Results  Component Value Date   HGBA1C 9.3 (H) 11/21/2022    Medications include:   Current Outpatient Medications:    amLODipine (NORVASC) 5 MG tablet, Take 1 tablet (5 mg total) by mouth daily., Disp: 90 tablet, Rfl: 0   aspirin EC 81 MG tablet, Take 1 tablet (81 mg total) by mouth daily. Swallow whole., Disp: 90 tablet, Rfl: 1   atorvastatin (LIPITOR) 40 MG tablet, 1 TABLET AT 6 PM ORALLY ONCE A DAY 90 DAYS, Disp: 90 tablet, Rfl: 0   Cyanocobalamin (B-12 PO), Take 1 tablet by mouth daily., Disp: , Rfl:    Semaglutide,0.25 or 0.5MG /DOS, (OZEMPIC, 0.25 OR 0.5 MG/DOSE,) 2 MG/3ML SOPN, Inject 0.5 mg into the skin once a week., Disp: 3 mL, Rfl: 1   spironolactone (ALDACTONE) 25 MG tablet, TAKE 1 TABLET (25  MG TOTAL) BY MOUTH DAILY., Disp: 90 tablet, Rfl: 0   UNKNOWN TO PATIENT, Take 1 tablet by mouth daily. 24 hour allergy medication, Disp: , Rfl:    Continuous Glucose Receiver (FREESTYLE LIBRE 3 READER) DEVI, 1 Act by Does not apply route daily. (Patient not taking: Reported on 01/29/2023), Disp: 1 each, Rfl: 3   Continuous Glucose Sensor (FREESTYLE LIBRE 3 SENSOR) MISC, 1 Act by Does not apply route daily. Place 1 sensor on the skin every 14 days. Use to check glucose continuously (Patient not taking: Reported on 01/29/2023), Disp: 2 each, Rfl: 5   Glucagon (GVOKE HYPOPEN 2-PACK) 1 MG/0.2ML SOAJ, Inject 1 Act into the skin daily as needed., Disp: 2 mL, Rfl: 5    There were no vitals taken for this visit. There is no height or weight on file to calculate BMI.   Diabetes Self-Management Education - 01/29/23 1149       Visit Information   Visit Type First/Initial      Initial Visit   Diabetes Type Type 2    Date Diagnosed 2022    Are you currently following a meal plan? No    Are you taking your medications as prescribed? Yes      Health Coping   How would you rate your overall health? Good      Psychosocial Assessment   Patient Belief/Attitude about Diabetes Motivated to manage diabetes  What is the hardest part about your diabetes right now, causing you the most concern, or is the most worrisome to you about your diabetes?   Making healty food and beverage choices;Getting support / problem solving    Self-care barriers Unsteady gait/risk for falls    Self-management support Doctor's office    Other persons present Patient;Family Member    Patient Concerns Nutrition/Meal planning;Problem Solving    Special Needs None    Preferred Learning Style No preference indicated    Learning Readiness Change in progress    How often do you need to have someone help you when you read instructions, pamphlets, or other written materials from your doctor or pharmacy? 1 - Never    What is the last  grade level you completed in school? some college      Pre-Education Assessment   Patient understands the diabetes disease and treatment process. Needs Instruction    Patient understands incorporating nutritional management into lifestyle. Needs Instruction    Patient undertands incorporating physical activity into lifestyle. Needs Instruction    Patient understands using medications safely. Needs Instruction    Patient understands monitoring blood glucose, interpreting and using results Needs Instruction    Patient understands prevention, detection, and treatment of acute complications. Needs Instruction    Patient understands prevention, detection, and treatment of chronic complications. Needs Instruction    Patient understands how to develop strategies to address psychosocial issues. Needs Instruction    Patient understands how to develop strategies to promote health/change behavior. Needs Instruction      Complications   Last HgB A1C per patient/outside source 9.3 %    How often do you check your blood sugar? 1-2 times/day    Fasting Blood glucose range (mg/dL) 16-109;604-540    Postprandial Blood glucose range (mg/dL) 981-191;47-829    Number of hypoglycemic episodes per month 0    Number of hyperglycemic episodes ( >200mg /dL): Never    Can you tell when your blood sugar is high? No    Have you had a dilated eye exam in the past 12 months? Yes    Have you had a dental exam in the past 12 months? Yes    Are you checking your feet? Yes    How many days per week are you checking your feet? 7      Dietary Intake   Breakfast glucerna chococlate 1/2 the container, coffee with original creamer, 1 tablespon sugar    Snack (morning) chips    Lunch bologna, 2 slices low calorie bread, coke zero    Snack (afternoon) ice cream    Dinner bologna sandwich or hot dog, 1 slices of bread or chicken, celery, onion rice, cream of chicken soup or beef tips with rice, green beans, corn or collards or  broccoli    Snack (evening) cake    Beverage(s) circle water, coffee coffee with original creamer with 1 tablespon sugar, coke zero, ~1 cup water      Activity / Exercise   Activity / Exercise Type ADL's    How many days per week do you exercise? 0    How many minutes per day do you exercise? 0    Total minutes per week of exercise 0      Patient Education   Previous Diabetes Education No    Disease Pathophysiology Explored patient's options for treatment of their diabetes    Healthy Eating Plate Method;Carbohydrate counting;Role of diet in the treatment of diabetes and the relationship between the  three main macronutrients and blood glucose level;Reviewed blood glucose goals for pre and post meals and how to evaluate the patients' food intake on their blood glucose level.    Being Active Role of exercise on diabetes management, blood pressure control and cardiac health.    Medications Reviewed patients medication for diabetes, action, purpose, timing of dose and side effects.    Monitoring Taught/evaluated SMBG meter.;Identified appropriate SMBG and/or A1C goals.    Acute complications Taught prevention, symptoms, and  treatment of hypoglycemia - the 15 rule.    Chronic complications Retinopathy and reason for yearly dilated eye exams    Diabetes Stress and Support Identified and addressed patients feelings and concerns about diabetes    Lifestyle and Health Coping Lifestyle issues that need to be addressed for better diabetes care      Individualized Goals (developed by patient)   Nutrition Follow meal plan discussed    Physical Activity Exercise 1-2 times per week    Medications take my medication as prescribed    Monitoring  Test my blood glucose as discussed    Problem Solving Eating Pattern;Addressing barriers to behavior change    Reducing Risk examine blood glucose patterns;do foot checks daily    Health Coping Ask for help with psychological, social, or emotional issues       Post-Education Assessment   Patient understands the diabetes disease and treatment process. Needs Review    Patient understands incorporating nutritional management into lifestyle. Needs Review    Patient undertands incorporating physical activity into lifestyle. Needs Review    Patient understands using medications safely. Needs Review    Patient understands monitoring blood glucose, interpreting and using results Comprehends key points    Patient understands prevention, detection, and treatment of acute complications. Comprehends key points    Patient understands prevention, detection, and treatment of chronic complications. Comprehends key points    Patient understands how to develop strategies to address psychosocial issues. Needs Review    Patient understands how to develop strategies to promote health/change behavior. Needs Review      Outcomes   Expected Outcomes Demonstrated interest in learning. Expect positive outcomes    Future DMSE 3-4 months    Program Status Not Completed             Individualized Plan for Diabetes Self-Management Training:   Learning Objective:  Patient will have a greater understanding of diabetes self-management. Patient education plan is to attend individual and/or group sessions per assessed needs and concerns.   Plan:   Patient Instructions  Pt would like to increase physical activity using sit and be fit exercise videos   Expected Outcomes:  Demonstrated interest in learning. Expect positive outcomes  Education material provided: ADA - How to Thrive: A Guide for Your Journey with Diabetes, My Plate, and Snack sheet  If problems or questions, patient to contact team via:  Phone  Future DSME appointment: 3-4 months

## 2023-01-29 NOTE — Patient Instructions (Signed)
It was a pleasure speaking with you today!  Increase Ozempic 0.5 mg once weekly. Continue checking blood sugars twice daily.    Feel free to call with any questions or concerns!  Arbutus Leas, PharmD, BCPS [facility] Clinical Pharmacist [facility] [phone]

## 2023-01-29 NOTE — Addendum Note (Signed)
Addended by: Candy Sledge R on: 01/29/2023 12:46 PM   Modules accepted: Orders

## 2023-01-29 NOTE — Patient Instructions (Signed)
Pt would like to increase physical activity using sit and be fit exercise videos

## 2023-02-02 DIAGNOSIS — I739 Peripheral vascular disease, unspecified: Secondary | ICD-10-CM | POA: Diagnosis not present

## 2023-02-02 DIAGNOSIS — L603 Nail dystrophy: Secondary | ICD-10-CM | POA: Diagnosis not present

## 2023-02-02 DIAGNOSIS — E1151 Type 2 diabetes mellitus with diabetic peripheral angiopathy without gangrene: Secondary | ICD-10-CM | POA: Diagnosis not present

## 2023-02-02 DIAGNOSIS — L84 Corns and callosities: Secondary | ICD-10-CM | POA: Diagnosis not present

## 2023-02-05 ENCOUNTER — Other Ambulatory Visit: Payer: Managed Care, Other (non HMO) | Admitting: Pharmacist

## 2023-02-05 DIAGNOSIS — I10 Essential (primary) hypertension: Secondary | ICD-10-CM

## 2023-02-05 DIAGNOSIS — E1151 Type 2 diabetes mellitus with diabetic peripheral angiopathy without gangrene: Secondary | ICD-10-CM

## 2023-02-05 MED ORDER — LOSARTAN POTASSIUM 25 MG PO TABS: 25.0000 mg | ORAL_TABLET | Freq: Every day | ORAL | 0 refills | Status: DC

## 2023-02-05 NOTE — Patient Instructions (Signed)
 It was a pleasure speaking with you today!  Start losartan  25 mg daily and continue monitoring blood pressure.  Continue Ozempic  at 0.5 mg once weekly.  Feel free to call with any questions or concerns!  Darrelyn Drum, PharmD, BCPS  [facility] Clinical Pharmacist [facility] [phone]

## 2023-02-05 NOTE — Progress Notes (Signed)
 02/05/2023 Name: Ashley Price MRN: [id number] DOB: [birth year]  Chief Complaint  Patient presents with   Hypertension   Diabetes   Medication Management    Ashley Price is a 68 y.o. year old female who presented for a telephone visit. Spoke to patient and her daughter, Ashley Price.   They were referred to the pharmacist by their PCP for assistance in managing diabetes.    Subjective:  Care Team: Primary Care Provider: Joshua Debby CROME, MD ; Next Scheduled Visit: none scheduled   Medication Access/Adherence  Current Pharmacy:  [id number] - [place] - [address] [place]  [address] Phone: [phone] Fax: [phone]   Patient reports affordability concerns with their medications: No  Patient reports access/transportation concerns to their pharmacy: No  Patient reports adherence concerns with their medications:  No     Diabetes:  Current medications: Ozempic  0.5 mg weekly Medications tried in the past: Farxiga  - trichomonas infection, metformin  (N/V when taking with Ozempic )  Current glucose readings: BG 112-189 fasting, post prandial around 170 (less in the 200s now), some improvement from BG fasting 160-178 prior to Ozempic  start.  Using glucometer; testing 2 times daily   Current meal patterns:  - Notes she does not eat a lot in one sitting, tends to graze/snack - Notes she stopped drinking soda/juice after PCP appt last week/A1c result - Reports not great at drinking water, has increased - Reports no significant change in diet but does note weight gain of about 11 lbs  Current physical activity: limited due to arthritis in knees  Current medication access support: none, pt has Medicaid  Hypertension: Current medications: spironolactone  25 mg daily, amlodipine  5 mg daily (started 12/9) Previous Medications: lisinopril  was stopped due to concern for hypotension at October PCP appt  Home BP readings:  BP previously  around 129/90 while on lisinopril  BP was up to 154/102 when off of lisinopril  BP has been 147/96, 137/92, 136/87 on amlodipine   Objective:  Lab Results  Component Value Date   HGBA1C 9.3 (H) 11/21/2022    Lab Results  Component Value Date   CREATININE 0.80 11/22/2022   BUN 12 11/22/2022   NA 137 11/22/2022   K 4.0 11/22/2022   CL 102 11/22/2022   CO2 18 (L) 11/21/2022    Lab Results  Component Value Date   CHOL 102 05/03/2022   HDL 42.50 05/03/2022   LDLCALC 42 05/03/2022   TRIG 90.0 05/03/2022   CHOLHDL 2 05/03/2022    Medications Reviewed Today     Reviewed by Ashley Price, RPH (Pharmacist) on 02/05/23 at [phone]  Med List Status: <None>   Medication Order Taking? Sig Documenting Provider Last Dose Status Informant  amLODipine  (NORVASC ) 5 MG tablet [id number] Yes Take 1 tablet (5 mg total) by mouth daily. Joshua Debby CROME, MD Taking Active   aspirin  EC 81 MG tablet [id number] Yes Take 1 tablet (81 mg total) by mouth daily. Swallow whole. Joshua Debby CROME, MD Taking Active   atorvastatin  (LIPITOR) 40 MG tablet [id number] Yes Take 1 tablet (40 mg total) by mouth daily. Joshua Debby CROME, MD Taking Active   Continuous Glucose Receiver (FREESTYLE LIBRE 3 READER) DEVI [id number]  1 Act by Does not apply route daily.  Patient not taking: Reported on 01/29/2023   Joshua Debby CROME, MD  Active   Continuous Glucose Sensor (FREESTYLE LIBRE 3 [place]) [place] [id number]  1 Act by Does not apply route daily. Place 1 sensor on the skin  every 14 days. Use to check glucose continuously  Patient not taking: Reported on 01/29/2023   Joshua Debby CROME, MD  Active   Cyanocobalamin  (B-12 PO) [id number]  Take 1 tablet by mouth daily. [provider]  Active   Glucagon  (GVOKE HYPOPEN  2-PACK) 1 MG/0.2ML SOAJ [id number]  Inject 1 Act into the skin daily as needed. Joshua Debby CROME, MD  Active   losartan  (COZAAR ) 25 MG tablet [id number] Yes Take 1 tablet (25 mg total) by mouth daily. Joshua Debby CROME,  MD  Active   Semaglutide ,0.25 or 0.5MG /DOS, (OZEMPIC , 0.25 OR 0.5 MG/DOSE,) 2 MG/3ML SOPN [id number] Yes Inject 0.5 mg into the skin once a week. Joshua Debby CROME, MD Taking Active   spironolactone  (ALDACTONE ) 25 MG tablet [id number] Yes TAKE 1 TABLET (25 MG TOTAL) BY MOUTH DAILY. Joshua Debby CROME, MD Taking Active   UNKNOWN TO PATIENT [id number]  Take 1 tablet by mouth daily. 24 hour allergy medication [provider]  Active               Assessment/Plan:   Diabetes: - Currently uncontrolled, A1c goal <7%. Recent significant increase from previous A1c - Continue Ozempic  0.5 mg weekly, await next A1c - Recommend to check glucose twice daily  Hypertension: - Currently uncontrolled. BP goal <130/80. - Start losartan  25 mg once daily (ARB preferred given recent dx of HFpEF per 01/05/23 echo)  Follow Up Plan: 2 week follow up  Darrelyn Drum, PharmD, BCPS Clinical Pharmacist [facility] [phone]

## 2023-02-19 ENCOUNTER — Other Ambulatory Visit (INDEPENDENT_AMBULATORY_CARE_PROVIDER_SITE_OTHER): Payer: Managed Care, Other (non HMO) | Admitting: Pharmacist

## 2023-02-19 VITALS — BP 122/85

## 2023-02-19 DIAGNOSIS — I10 Essential (primary) hypertension: Secondary | ICD-10-CM

## 2023-02-19 DIAGNOSIS — E1151 Type 2 diabetes mellitus with diabetic peripheral angiopathy without gangrene: Secondary | ICD-10-CM

## 2023-02-19 NOTE — Progress Notes (Signed)
02/19/2023 Name: Ashley Price MRN: [id number] DOB: [birth year]  Chief Complaint  Patient presents with   Diabetes   Hypertension   Medication Management       Ashley Price is a 68 y.o. year old female who presented for a telephone visit. Spoke to patient and her daughter, [name].   They were referred to the pharmacist by their PCP for assistance in managing diabetes.    Subjective:  Care Team: Primary Care Provider: [name], MD ; Next Scheduled Visit: none scheduled   Medication Access/Adherence  Current Pharmacy:  [id number] - [place], [place] - [address] [place]  [address] Phone: [phone] Fax: [phone]   Patient reports affordability concerns with their medications: No  Patient reports access/transportation concerns to their pharmacy: No  Patient reports adherence concerns with their medications:  No     Diabetes:  Current medications: Ozempic 0.5 mg weekly Medications tried in the past: Farxiga - trichomonas infection, metformin (N/V when taking with Ozempic)  Current glucose readings: BG 150-160 fasting BG 180-200 after eating  Using glucometer; testing 2 times daily   Current meal patterns:  - Notes she does not eat a lot in one sitting, tends to graze/snack - Notes she stopped drinking soda/juice after PCP appt last week/A1c result - Reports not great at drinking water, has increased - Reports no significant change in diet but does note weight gain of about 11 lbs  Current physical activity: limited due to arthritis in knees  Current medication access support: none, pt has Medicaid  Hypertension: Current medications: spironolactone 25 mg daily, amlodipine 5 mg daily (started 12/9), losartan 25 mg daily (started 02/05/23) Previous Medications: lisinopril was stopped due to concern for hypotension at October PCP appt  Home BP readings:  BP previously around 129/90 while on lisinopril BP was up to  154/102 when off of lisinopril BP has been 147/96, 137/92, 136/87 on amlodipine BP 125/87, 122/85 since starting losartan  Objective:  Lab Results  Component Value Date   HGBA1C 9.3 (H) 11/21/2022    Lab Results  Component Value Date   CREATININE 0.80 11/22/2022   BUN 12 11/22/2022   NA 137 11/22/2022   K 4.0 11/22/2022   CL 102 11/22/2022   CO2 18 (L) 11/21/2022    Lab Results  Component Value Date   CHOL 102 05/03/2022   HDL 42.50 05/03/2022   LDLCALC 42 05/03/2022   TRIG 90.0 05/03/2022   CHOLHDL 2 05/03/2022    Medications Reviewed Today     Reviewed by Bonita Quin, RPH (Pharmacist) on 02/19/23 at 0909  Med List Status: <None>   Medication Order Taking? Sig Documenting Provider Last Dose Status Informant  amLODipine (NORVASC) 5 MG tablet [id number] Yes Take 1 tablet (5 mg total) by mouth daily. [name], MD Taking Active   aspirin EC 81 MG tablet [id number] Yes Take 1 tablet (81 mg total) by mouth daily. Swallow whole. [name], MD Taking Active   atorvastatin (LIPITOR) 40 MG tablet [id number] Yes Take 1 tablet (40 mg total) by mouth daily. [name], MD Taking Active   Continuous Glucose Receiver (FREESTYLE LIBRE 3 READER) DEVI [id number]  1 Act by Does not apply route daily.  Patient not taking: Reported on 01/29/2023   [name], MD  Active   Continuous Glucose Sensor (FREESTYLE LIBRE 3 [place]) [place] [id number]  1 Act by Does not apply route daily. Place 1 sensor on the skin  every 14 days. Use to check glucose continuously  Patient not taking: Reported on 01/29/2023   [name], MD  Active   Cyanocobalamin (B-12 PO) [id number]  Take 1 tablet by mouth daily. [provider]  Active   Glucagon (GVOKE HYPOPEN 2-PACK) 1 MG/0.2ML SOAJ [id number]  Inject 1 Act into the skin daily as needed. [name], MD  Active   losartan (COZAAR) 25 MG tablet [id number] Yes Take 1 tablet (25 mg total) by mouth daily. [name], MD  Taking Active   Semaglutide,0.25 or 0.5MG /DOS, (OZEMPIC, 0.25 OR 0.5 MG/DOSE,) 2 MG/3ML SOPN [id number] Yes Inject 0.5 mg into the skin once a week. [name], MD Taking Active   spironolactone (ALDACTONE) 25 MG tablet [id number] Yes TAKE 1 TABLET (25 MG TOTAL) BY MOUTH DAILY. [name], MD Taking Active   UNKNOWN TO PATIENT [id number]  Take 1 tablet by mouth daily. 24 hour allergy medication [provider]  Active               Assessment/Plan:   Diabetes: - Currently uncontrolled, A1c goal <7%. Recent significant increase from previous A1c - Continue Ozempic 0.5 mg weekly, await next A1c on 2/12 - Recommend to check glucose twice daily  Hypertension: - Currently controlled. BP goal <130/80. Diastolic slightly above goal however systolic much improved - ARB preferred given recent dx of HFpEF per 01/05/23 echo - Continue current regimen - BMP at 2/12 appt  Follow Up Plan: F/u 2/12 appt  Arbutus Leas, PharmD, BCPS Clinical Pharmacist [place] [facility] [phone]

## 2023-02-19 NOTE — Patient Instructions (Signed)
 It was a pleasure speaking with you today!    Feel free to call with any questions or concerns!  Arbutus Leas, PharmD, BCPS, CPP Clinical Pharmacist Practitioner [facility] at [facility] [phone]

## 2023-02-24 ENCOUNTER — Other Ambulatory Visit: Payer: Self-pay | Admitting: Internal Medicine

## 2023-02-24 DIAGNOSIS — I10 Essential (primary) hypertension: Secondary | ICD-10-CM

## 2023-03-01 ENCOUNTER — Ambulatory Visit: Payer: Self-pay

## 2023-03-01 NOTE — Patient Outreach (Signed)
  Care Coordination   Follow Up Visit Note   03/01/2023 Name: Ashley Price MRN: [id number] DOB: 09-06-1955  Ashley Price is a 68 y.o. year old female who sees [name], MD for primary care. I spoke with  Ashley Price and daughter Ashley Price (dpr) by phone today.  What matters to the patients health and wellness today?  Patient reports "I am fine". Ashley Price states, since starting losartan, patient's blood pressure has improved. Today BP 113/75. She also reports improvement since starting ozempic. Per Ashley Price, BS range 133-160 fasting. BS today fasting 133. Ashley Price also states after meals BS ranging 180-200. Clinical pharmacy telephone visit 02/19/23 and patient had an office visit with Registered Dietician on 01/29/23. Ashley Price reports both have been very helpful. She states she is aware of more nutritional and food choices. They decline any additional educational material today.  Goals Addressed             This Visit's Progress    Education/Management of DM, HTN       Interventions Today    Flowsheet Row Most Recent Value  Chronic Disease   Chronic disease during today's visit Diabetes, Hypertension (HTN)  General Interventions   General Interventions Discussed/Reviewed General Interventions Reviewed, Doctor Visits  [Evaluation of current treatment plan for health condition and patient's adherence to plan. Assessed blood sugar readings,  assessed BP readings]  Doctor Visits Discussed/Reviewed Doctor Visits Reviewed, PCP, Specialist  PCP/Specialist Visits Compliance with follow-up visit  [reviewed upcoming appointments]  Education Interventions   Education Provided Provided Education  Provided Verbal Education On Blood Sugar Monitoring, Medication, When to see the doctor, Nutrition  [advised to continue to take medications as prescribed, attend provider visits as scheduled, eat healthy]  Nutrition Interventions   Nutrition Discussed/Reviewed Nutrition Discussed, Increasing  proteins  [confirmed has been to see registered dietician.]  Pharmacy Interventions   Pharmacy Dicussed/Reviewed Pharmacy Topics Reviewed  Edilia Bo has been active with clinical pharmacist]            SDOH assessments and interventions completed:  Yes  SDOH Interventions Today    Flowsheet Row Most Recent Value  SDOH Interventions   Food Insecurity Interventions Intervention Not Indicated  Housing Interventions Intervention Not Indicated  Transportation Interventions Intervention Not Indicated  Utilities Interventions Intervention Not Indicated     Care Coordination Interventions:  Yes, provided   Follow up plan: Follow up call scheduled for 03/28/23    Encounter Outcome:  Patient Visit Completed   Kathyrn Sheriff, RN, MSN, BSN, CCM [place]  [facility], Population Health Case Manager Phone: [phone]

## 2023-03-01 NOTE — Patient Instructions (Signed)
Visit Information  Thank you for taking time to visit with me today. Please don't hesitate to contact me if I can be of assistance to you.   Following are the goals we discussed today:  Continue to take medications as prescribed. Continue to attend provider visits as scheduled Continue to eat healthy, lean meats, vegetables, fruits, avoid saturated and transfats Contact provider with health questions or concerns as needed Check feet daily for cuts, sores or redness Continue to check blood sugar as recommended and notify provider if questions or concerns Continue to check blood pressure routinely and contact provider if questions or concerns  Our next appointment is by telephone on 03/28/23 at 10:00 am  Please call the care guide team at [phone] if you need to cancel or reschedule your appointment.   If you are experiencing a Mental Health or Behavioral Health Crisis or need someone to talk to, please call the Suicide and Crisis Lifeline: 988 call the [place] National Suicide Prevention Lifeline: (614)516-4669 or TTY: 5592582533 TTY 619-830-4630) to talk to a trained counselor  Kathyrn Sheriff, RN, MSN, BSN, CCM [place]  [facility], Population Health Case Manager Phone: [phone]

## 2023-03-02 ENCOUNTER — Other Ambulatory Visit: Payer: Self-pay | Admitting: Internal Medicine

## 2023-03-02 DIAGNOSIS — I10 Essential (primary) hypertension: Secondary | ICD-10-CM

## 2023-03-05 MED ORDER — SPIRONOLACTONE 25 MG PO TABS: 25.0000 mg | ORAL_TABLET | Freq: Every day | ORAL | 0 refills | Status: DC

## 2023-03-14 ENCOUNTER — Encounter: Payer: Self-pay | Admitting: Internal Medicine

## 2023-03-14 ENCOUNTER — Ambulatory Visit (INDEPENDENT_AMBULATORY_CARE_PROVIDER_SITE_OTHER): Payer: Managed Care, Other (non HMO) | Admitting: Internal Medicine

## 2023-03-14 VITALS — BP 112/74 | HR 76 | Temp 97.5°F | Resp 16 | Ht 63.0 in | Wt 168.0 lb

## 2023-03-14 DIAGNOSIS — Z794 Long term (current) use of insulin: Secondary | ICD-10-CM

## 2023-03-14 DIAGNOSIS — E785 Hyperlipidemia, unspecified: Secondary | ICD-10-CM

## 2023-03-14 DIAGNOSIS — I10 Essential (primary) hypertension: Secondary | ICD-10-CM | POA: Diagnosis not present

## 2023-03-14 DIAGNOSIS — E1151 Type 2 diabetes mellitus with diabetic peripheral angiopathy without gangrene: Secondary | ICD-10-CM

## 2023-03-14 DIAGNOSIS — E119 Type 2 diabetes mellitus without complications: Secondary | ICD-10-CM

## 2023-03-14 DIAGNOSIS — A5903 Trichomonal cystitis and urethritis: Secondary | ICD-10-CM | POA: Diagnosis not present

## 2023-03-14 DIAGNOSIS — N1831 Chronic kidney disease, stage 3a: Secondary | ICD-10-CM | POA: Diagnosis not present

## 2023-03-14 LAB — LIPID PANEL
Cholesterol: 82 mg/dL (ref 0–200)
HDL: 37.9 mg/dL — ABNORMAL LOW (ref >39.00)
LDL Cholesterol: 30 mg/dL (ref 0–99)
NonHDL: 43.94
Total CHOL/HDL Ratio: 2
Triglycerides: 71 mg/dL (ref 0.0–149.0)
VLDL: 14.2 mg/dL (ref 0.0–40.0)

## 2023-03-14 LAB — CBC WITH DIFFERENTIAL/PLATELET
Basophils Absolute: 0.1 10*3/uL (ref 0.0–0.1)
Basophils Relative: 0.8 % (ref 0.0–3.0)
Eosinophils Absolute: 0.4 10*3/uL (ref 0.0–0.7)
Eosinophils Relative: 3.3 % (ref 0.0–5.0)
HCT: 43.6 % (ref 36.0–46.0)
Hemoglobin: 14.2 g/dL (ref 12.0–15.0)
Lymphocytes Relative: 24.6 % (ref 12.0–46.0)
Lymphs Abs: 3.4 10*3/uL (ref 0.7–4.0)
MCHC: 32.6 g/dL (ref 30.0–36.0)
MCV: 90.1 fL (ref 78.0–100.0)
Monocytes Absolute: 1.1 10*3/uL — ABNORMAL HIGH (ref 0.1–1.0)
Monocytes Relative: 8.3 % (ref 3.0–12.0)
Neutro Abs: 8.7 10*3/uL — ABNORMAL HIGH (ref 1.4–7.7)
Neutrophils Relative %: 63 % (ref 43.0–77.0)
Platelets: 280 10*3/uL (ref 150.0–400.0)
RBC: 4.84 Mil/uL (ref 3.87–5.11)
RDW: 14 % (ref 11.5–15.5)
WBC: 13.8 10*3/uL — ABNORMAL HIGH (ref 4.0–10.5)

## 2023-03-14 LAB — HEPATIC FUNCTION PANEL
ALT: 15 U/L (ref 0–35)
AST: 27 U/L (ref 0–37)
Albumin: 4.3 g/dL (ref 3.5–5.2)
Alkaline Phosphatase: 70 U/L (ref 39–117)
Bilirubin, Direct: 0.1 mg/dL (ref 0.0–0.3)
Total Bilirubin: 0.5 mg/dL (ref 0.2–1.2)
Total Protein: 7.8 g/dL (ref 6.0–8.3)

## 2023-03-14 LAB — BASIC METABOLIC PANEL
BUN: 12 mg/dL (ref 6–23)
CO2: 22 meq/L (ref 19–32)
Calcium: 9.6 mg/dL (ref 8.4–10.5)
Chloride: 103 meq/L (ref 96–112)
Creatinine, Ser: 0.97 mg/dL (ref 0.40–1.20)
GFR: 59.86 mL/min — ABNORMAL LOW (ref >60.00)
Glucose, Bld: 113 mg/dL — ABNORMAL HIGH (ref 70–99)
Potassium: 3.7 meq/L (ref 3.5–5.1)
Sodium: 134 meq/L — ABNORMAL LOW (ref 135–145)

## 2023-03-14 LAB — TSH: TSH: 1.46 u[IU]/mL (ref 0.35–5.50)

## 2023-03-14 NOTE — Patient Instructions (Signed)

## 2023-03-14 NOTE — Progress Notes (Unsigned)
Subjective:  Patient ID: Ashley Price, female    DOB: [birth year]  Age: 68 y.o. MRN: [id number]  CC: Hypertension, Hyperlipidemia, and Diabetes   HPI ALARA DANIEL presents for f/up ----  Discussed the use of AI scribe software for clinical note transcription with the patient, who gave verbal consent to proceed.  History of Present Illness   Ashley Price is a 68 year old female with hypertension and diabetes who presents for a routine follow-up visit.  She is currently using the Ozempic injection at a dose of 0.5 mg, which is the dose above the lowest available. She has been on this medication for at least a month and a half and attributes her recent weight loss to its use. No symptoms of excessive thirst, excessive urination, dizziness, or lightheadedness. She is due for a recheck of her A1c to monitor diabetes control.  Her blood pressure readings have been consistently good, with this morning's reading at 120/83 mmHg. Typically, her blood pressure ranges from 115/73 to 125/80 mmHg. Her blood pressure now ranges between 113/70 to 136/80 mmHg.       Outpatient Medications Prior to Visit  Medication Sig Dispense Refill   amLODipine (NORVASC) 5 MG tablet Take 1 tablet (5 mg total) by mouth daily. 90 tablet 0   aspirin EC 81 MG tablet Take 1 tablet (81 mg total) by mouth daily. Swallow whole. 90 tablet 1   atorvastatin (LIPITOR) 40 MG tablet Take 1 tablet (40 mg total) by mouth daily. 90 tablet 0   Cyanocobalamin (B-12 PO) Take 1 tablet by mouth daily.     Glucagon (GVOKE HYPOPEN 2-PACK) 1 MG/0.2ML SOAJ Inject 1 Act into the skin daily as needed. 2 mL 5   losartan (COZAAR) 25 MG tablet Take 1 tablet (25 mg total) by mouth daily. 90 tablet 0   Semaglutide,0.25 or 0.5MG /DOS, (OZEMPIC, 0.25 OR 0.5 MG/DOSE,) 2 MG/3ML SOPN Inject 0.5 mg into the skin once a week. 3 mL 1   spironolactone (ALDACTONE) 25 MG tablet Take 1 tablet (25 mg total) by mouth daily. 90 tablet 0   UNKNOWN TO PATIENT  Take 1 tablet by mouth daily. 24 hour allergy medication     Continuous Glucose Receiver (FREESTYLE LIBRE 3 READER) DEVI 1 Act by Does not apply route daily. 1 each 3   Continuous Glucose Sensor (FREESTYLE LIBRE 3 SENSOR) MISC 1 Act by Does not apply route daily. Place 1 sensor on the skin every 14 days. Use to check glucose continuously 2 each 5   No facility-administered medications prior to visit.    ROS Review of Systems  Objective:  BP 112/74 (BP Location: Left Arm, Patient Position: Sitting, Cuff Size: Normal)   Pulse 76   Temp (!) 97.5 F (36.4 C) (Oral)   Resp 16   Ht 5\' 3"  (1.6 m)   Wt 168 lb (76.2 kg)   SpO2 98%   BMI 29.76 kg/m   BP Readings from Last 3 Encounters:  03/14/23 112/74  02/19/23 122/85  11/22/22 115/60    Wt Readings from Last 3 Encounters:  03/14/23 168 lb (76.2 kg)  11/21/22 180 lb 9.6 oz (81.9 kg)  09/05/22 178 lb 4.8 oz (80.9 kg)    Physical Exam  Lab Results  Component Value Date   WBC 13.8 (H) 03/14/2023   HGB 14.2 03/14/2023   HCT 43.6 03/14/2023   PLT 280.0 03/14/2023   GLUCOSE 113 (H) 03/14/2023   CHOL 82 03/14/2023   TRIG 71.0  03/14/2023   HDL 37.90 (L) 03/14/2023   LDLCALC 30 03/14/2023   ALT 15 03/14/2023   AST 27 03/14/2023   NA 134 (L) 03/14/2023   K 3.7 03/14/2023   CL 103 03/14/2023   CREATININE 0.97 03/14/2023   BUN 12 03/14/2023   CO2 22 03/14/2023   TSH 1.46 03/14/2023   INR 1.2 07/29/2021   HGBA1C 7.3 (H) 03/14/2023   MICROALBUR 0.9 11/21/2022    CT CHEST LUNG CA SCREEN LOW DOSE W/O CM Result Date: 01/21/2023 CLINICAL DATA:  22 pack-year former smoker, quit 1 year ago. EXAM: CT CHEST WITHOUT CONTRAST LOW-DOSE FOR LUNG CANCER SCREENING TECHNIQUE: Multidetector CT imaging of the chest was performed following the standard protocol without IV contrast. RADIATION DOSE REDUCTION: This exam was performed according to the departmental dose-optimization program which includes automated exposure control, adjustment of  the mA and/or kV according to patient size and/or use of iterative reconstruction technique. COMPARISON:  01/04/2022. FINDINGS: Cardiovascular: Atherosclerotic calcification of the aorta. Heart is at the upper limits of normal in size to mildly enlarged. No pericardial effusion. Mediastinum/Nodes: No pathologically enlarged mediastinal or axillary lymph nodes. Hilar regions are difficult to definitively evaluate without IV contrast. Esophagus is grossly unremarkable. Lungs/Pleura: Mild centrilobular emphysema. Smoking related respiratory bronchiolitis. Cylindrical bronchiectasis. 3.6 mm perifissural nodule, unchanged. No new pulmonary nodules. No pleural fluid. Airway is otherwise unremarkable. Upper Abdomen: Nodular thickening of the right adrenal gland with dominant nodule measuring 2.3 cm and 0 Hounsfield units. Similar nodularity in the left adrenal gland, measuring up to 1.7 cm and 7 Hounsfield units. No specific follow-up necessary. Visualized portions of the liver, gallbladder, adrenal glands, kidneys, spleen, pancreas, stomach and bowel are otherwise grossly unremarkable. No upper abdominal adenopathy. Musculoskeletal: Degenerative changes in the spine. IMPRESSION: 1. Lung-RADS 2, benign appearance or behavior. Continue annual screening with low-dose chest CT without contrast in 12 months. 2. Cylindrical bronchiectasis. 3. Bilateral adrenal adenomas. 4.  Aortic atherosclerosis (ICD10-I70.0). 5.  Emphysema (ICD10-J43.9). Electronically Signed   By: Leanna Battles M.D.   On: 01/21/2023 15:20    Assessment & Plan:  Essential hypertension -     Hepatic function panel; Future -     Basic metabolic panel; Future -     Basic metabolic panel -     Urinalysis, Routine w reflex microscopic; Future  Stage 3a chronic kidney disease (HCC) -     Urinalysis, Routine w reflex microscopic; Future -     Basic metabolic panel; Future -     Urinalysis, Routine w reflex microscopic; Future  Insulin-requiring or  dependent type II diabetes mellitus (HCC) -     Urinalysis, Routine w reflex microscopic; Future -     CBC with Differential/Platelet; Future -     Basic metabolic panel; Future -     Hemoglobin A1c; Future  Trichomonal urethritis -     Urinalysis, Routine w reflex microscopic; Future  Dyslipidemia, goal LDL below 70 -     Lipid panel; Future -     TSH; Future -     Hepatic function panel; Future  Type 2 diabetes mellitus with diabetic peripheral angiopathy without gangrene, without long-term current use of insulin (HCC) -     Hemoglobin A1c -     Urinalysis, Routine w reflex microscopic; Future     Follow-up: Return in about 4 months (around 07/12/2023).  Sanda Linger, MD

## 2023-03-15 LAB — HEMOGLOBIN A1C: Hgb A1c MFr Bld: 7.3 % — ABNORMAL HIGH (ref 4.6–6.5)

## 2023-03-19 ENCOUNTER — Encounter: Payer: Self-pay | Admitting: Pharmacist

## 2023-03-20 LAB — URINALYSIS, ROUTINE W REFLEX MICROSCOPIC
Bilirubin Urine: NEGATIVE
Hgb urine dipstick: NEGATIVE
Ketones, ur: NEGATIVE
Nitrite: NEGATIVE
RBC / HPF: NONE SEEN (ref 0–?)
Specific Gravity, Urine: 1.01 (ref 1.000–1.030)
Total Protein, Urine: NEGATIVE
Urine Glucose: NEGATIVE
Urobilinogen, UA: 0.2 (ref 0.0–1.0)
pH: 6.5 (ref 5.0–8.0)

## 2023-03-26 ENCOUNTER — Encounter: Payer: Self-pay | Admitting: Internal Medicine

## 2023-03-28 ENCOUNTER — Ambulatory Visit: Payer: Self-pay

## 2023-03-28 NOTE — Patient Outreach (Signed)
 Care Coordination   Follow Up Visit Note   03/28/2023 Name: Ashley Price MRN: [id number] DOB: [birth year]  Ashley Price is a 68 y.o. year old female who sees Ashley Grandchild, MD for primary care. I spoke with  Ashley Price and daughter Ashley Price) by phone today.  What matters to the patients health and wellness today? Patient states, "I am fine" and daughter reports that patient is doing well. Office visit with PCP completed on 03/14/23. A1C down to 7.3 from 9.3 on 11/21/22. Per daughter, patient continues to take medications as prescribed. Per daughter, patient's BS this am 137 fasting, but has not been higher than this. Daughter reports patient is eating better and healthier. Daughter reports visit with nutritionist was very helpful. She denies any additional educational or resource needs at this time. Daughter to contact RNCM if care management needs in the future.  Goals Addressed             This Visit's Progress    COMPLETED: Education/Management of DM, HTN       Interventions Today    Flowsheet Row Most Recent Value  Chronic Disease   Chronic disease during today's visit Diabetes, Hypertension (HTN)  General Interventions   General Interventions Discussed/Reviewed General Interventions Reviewed, Doctor Visits  [Evaluation of current treatment plan for health condition and patient's adherence to plan.]  Doctor Visits Discussed/Reviewed PCP, Doctor Visits Reviewed  PCP/Specialist Visits Compliance with follow-up visit  [reviewed upcoming appointment, advised to schedule next appointment with PCP due around 07/12/2023. daughter to call to schedule.]  Education Interventions   Education Provided Provided Education  Provided Verbal Education On When to see the doctor, Medication, Blood Sugar Monitoring  [advised to take medications as prescribed, attend provider visits as recommended/scheduled, eat healthy, contact provider with health questions/concerns.]  Nutrition  Interventions   Nutrition Discussed/Reviewed Nutrition Reviewed  [discussed avoiding processed foods, healthy food selections ie lean protein, vegetables, fruit]  Pharmacy Interventions   Pharmacy Dicussed/Reviewed Pharmacy Topics Reviewed            SDOH assessments and interventions completed:  No  Care Coordination Interventions:  Yes, provided   Follow up plan: No further intervention required.   Encounter Outcome:  Patient Visit Completed   Ashley Sheriff, RN, MSN, BSN, CCM [place]  [facility], Population Health Case Manager Phone: [phone]

## 2023-03-28 NOTE — Patient Instructions (Signed)
 Visit Information  Thank you for taking time to visit with me today. Please don't hesitate to contact me if I can be of assistance to you.   Following are the goals we discussed today:  Continue to take medications as prescribed. Continue to attend provider visits as scheduled Continue to eat healthy, lean meats, vegetables, fruits, avoid saturated and transfats. Avoid processed foods. Contact provider with health questions or concerns as needed Continue to check blood sugar as recommended and notify provider if questions or concerns Continue to check blood pressure routinely and contact provider if questions or concerns   Please call the care guide team at [phone] if you need to schedule an appointment.   If you are experiencing a Mental Health or Behavioral Health Crisis or need someone to talk to, please call the Suicide and Crisis Lifeline: 988 call the [place] National Suicide Prevention Lifeline: 601 572 2807 or TTY: 423-423-6230 TTY 780-788-2266) to talk to a trained counselor   Kathyrn Sheriff, RN, MSN, BSN, CCM [place]  [facility], Population Health Case Manager Phone: [phone]

## 2023-03-29 ENCOUNTER — Other Ambulatory Visit: Payer: Self-pay | Admitting: Internal Medicine

## 2023-03-29 DIAGNOSIS — E1151 Type 2 diabetes mellitus with diabetic peripheral angiopathy without gangrene: Secondary | ICD-10-CM

## 2023-03-30 ENCOUNTER — Other Ambulatory Visit: Payer: Self-pay | Admitting: Internal Medicine

## 2023-03-30 DIAGNOSIS — I10 Essential (primary) hypertension: Secondary | ICD-10-CM

## 2023-03-30 DIAGNOSIS — I5189 Other ill-defined heart diseases: Secondary | ICD-10-CM

## 2023-04-02 ENCOUNTER — Other Ambulatory Visit: Payer: Self-pay | Admitting: Internal Medicine

## 2023-04-02 DIAGNOSIS — I10 Essential (primary) hypertension: Secondary | ICD-10-CM

## 2023-04-02 DIAGNOSIS — E1151 Type 2 diabetes mellitus with diabetic peripheral angiopathy without gangrene: Secondary | ICD-10-CM

## 2023-04-02 DIAGNOSIS — I5189 Other ill-defined heart diseases: Secondary | ICD-10-CM

## 2023-04-02 MED ORDER — SEMAGLUTIDE (1 MG/DOSE) 4 MG/3ML ~~LOC~~ SOPN
1.0000 mg | PEN_INJECTOR | SUBCUTANEOUS | 0 refills | Status: DC
Start: 2023-04-02 — End: 2023-06-12

## 2023-04-27 ENCOUNTER — Other Ambulatory Visit: Payer: Self-pay | Admitting: Internal Medicine

## 2023-04-27 DIAGNOSIS — I10 Essential (primary) hypertension: Secondary | ICD-10-CM

## 2023-04-27 DIAGNOSIS — E785 Hyperlipidemia, unspecified: Secondary | ICD-10-CM

## 2023-04-29 ENCOUNTER — Other Ambulatory Visit: Payer: Self-pay | Admitting: Internal Medicine

## 2023-04-29 DIAGNOSIS — E785 Hyperlipidemia, unspecified: Secondary | ICD-10-CM

## 2023-04-30 MED ORDER — ATORVASTATIN CALCIUM 40 MG PO TABS: 40.0000 mg | ORAL_TABLET | Freq: Every day | ORAL | 0 refills | Status: DC

## 2023-04-30 MED ORDER — LOSARTAN POTASSIUM 25 MG PO TABS: 25.0000 mg | ORAL_TABLET | Freq: Every day | ORAL | 0 refills | Status: DC

## 2023-05-03 ENCOUNTER — Other Ambulatory Visit: Payer: Self-pay | Admitting: Internal Medicine

## 2023-05-03 DIAGNOSIS — I5189 Other ill-defined heart diseases: Secondary | ICD-10-CM

## 2023-05-03 DIAGNOSIS — I10 Essential (primary) hypertension: Secondary | ICD-10-CM

## 2023-05-08 ENCOUNTER — Telehealth: Payer: Self-pay | Admitting: Internal Medicine

## 2023-05-08 NOTE — Telephone Encounter (Signed)
 Copied from CRM [id number]. Topic: Referral - Request for Referral >> May 08, 2023 12:15 PM Florestine Avers wrote: Did the patient discuss referral with their provider in the last year? Yes (If No - schedule appointment) (If Yes - send message)  Appointment offered? No  Type of order/referral and detailed reason for visit: Patient needs a Diabetic Eye Exam  Preference of office, provider, location: Patient is open to recommendations from Dr. Yetta Barre.  If referral order, have you been seen by this specialty before? Yes (If Yes, this issue or another issue? When? Where?  Can we respond through MyChart? Yes  Patient is asking for follow up phone call once referral has been placed.

## 2023-05-10 ENCOUNTER — Other Ambulatory Visit (INDEPENDENT_AMBULATORY_CARE_PROVIDER_SITE_OTHER): Payer: Managed Care, Other (non HMO) | Admitting: Pharmacist

## 2023-05-10 DIAGNOSIS — E119 Type 2 diabetes mellitus without complications: Secondary | ICD-10-CM

## 2023-05-10 NOTE — Progress Notes (Signed)
 05/10/2023 Name: Ashley Price MRN: [id number] DOB: [birth year]  Chief Complaint  Patient presents with   Hypertension   Diabetes   Medication Management     Ashley Price is a 68 y.o. year old female who presented for a telephone visit. Spoke to patient and her daughter, Ashley Price.   They were referred to the pharmacist by their PCP for assistance in managing diabetes.    Subjective:  Care Team: Primary Care Provider: [name], MD ; Next Scheduled Visit: none scheduled   Medication Access/Adherence  Current Pharmacy:  [id number] - [place], [place] - [address] [place] [address] Phone: [phone] Fax: [phone]   Patient reports affordability concerns with their medications: No  Patient reports access/transportation concerns to their pharmacy: No  Patient reports adherence concerns with their medications:  No     Diabetes:  Current medications: Ozempic 1 mg weekly (increased 3/3) Medications tried in the past: [place] - trichomonas infection, metformin (N/V when taking with Ozempic)  Current glucose readings: BG 114 most recent, sometimes after eating will go up to 190. Notes she has seen none above 200  Using glucometer; testing 2 times daily   Current meal patterns:  - Notes she does not eat a lot in one sitting, tends to graze/snack - Notes she stopped drinking soda/juice after PCP appt last week/A1c result - Reports not great at drinking water, has increased - Reports no significant change in diet but does note weight gain of about 11 lbs  Current physical activity: limited due to arthritis in knees  Current medication access support: none, pt has Medicaid  Hypertension: Current medications: spironolactone 25 mg daily, amlodipine 5 mg daily (started 12/9), losartan 25 mg daily (started 02/05/23) Previous Medications: lisinopril was stopped due to concern for hypotension at October PCP appt  Home BP readings:   BP 120-130/70-80  Objective:  Lab Results  Component Value Date   HGBA1C 7.3 (H) 03/14/2023    Lab Results  Component Value Date   CREATININE 0.97 03/14/2023   BUN 12 03/14/2023   NA 134 (L) 03/14/2023   K 3.7 03/14/2023   CL 103 03/14/2023   CO2 22 03/14/2023    Lab Results  Component Value Date   CHOL 82 03/14/2023   HDL 37.90 (L) 03/14/2023   LDLCALC 30 03/14/2023   TRIG 71.0 03/14/2023   CHOLHDL 2 03/14/2023    Medications Reviewed Today     Reviewed by Ashley Price, RPH (Pharmacist) on 05/10/23 at 1016  Med List Status: <None>   Medication Order Taking? Sig Documenting Provider Last Dose Status Informant  amLODipine (NORVASC) 5 MG tablet [id number]  TAKE 1 TABLET (5 MG TOTAL) BY MOUTH DAILY. Ashley Grandchild, MD  Active   aspirin EC 81 MG tablet [id number] No Take 1 tablet (81 mg total) by mouth daily. Swallow whole. Ashley Grandchild, MD Taking Active   atorvastatin (LIPITOR) 40 MG tablet [id number]  Take 1 tablet (40 mg total) by mouth daily. Ashley Grandchild, MD  Active   atorvastatin (LIPITOR) 40 MG tablet [id number]  TAKE 1 TABLET BY MOUTH EVERY DAY Ashley Grandchild, MD  Active   Cyanocobalamin (B-12 PO) [id number] No Take 1 tablet by mouth daily. [provider] Taking Active   Glucagon (GVOKE HYPOPEN 2-PACK) 1 MG/0.2ML SOAJ [id number] No Inject 1 Act into the skin daily as needed. Ashley Grandchild, MD Taking Active   losartan (COZAAR) 25 MG tablet [id number]  Take  1 tablet (25 mg total) by mouth daily. Ashley Grandchild, MD  Active   Semaglutide, 1 MG/DOSE, 4 MG/3ML Namon Cirri [id number]  Inject 1 mg as directed once a week. Ashley Grandchild, MD  Active   spironolactone (ALDACTONE) 25 MG tablet [id number] No Take 1 tablet (25 mg total) by mouth daily. Ashley Grandchild, MD Taking Active   UNKNOWN TO PATIENT [id number] No Take 1 tablet by mouth daily. 24 hour allergy medication [provider] Taking Active            Med Note Earlene Plater, [place]   Thu Mar 01, 2023 10:17 AM) Patient reports takes Allegra daily              Assessment/Plan:   Diabetes: - Currently uncontrolled, A1c goal <7%. A1c improved 9.3 to 7.3% on Ozempic - Continue Ozempic 1 mg weekly - Recommend to check glucose twice daily  Hypertension: - Currently controlled. BP goal <130/80.  - ARB preferred given recent dx of HFpEF per 01/05/23 echo - Continue current regimen - Scr and K were WNL, stable on BMP 2/12  Follow Up Plan: PRN  Arbutus Leas, PharmD, BCPS Clinical Pharmacist [place] [facility] [phone]

## 2023-05-10 NOTE — Patient Instructions (Signed)
 It was a pleasure speaking with you today!  Continue your current medication regimen. Reach out if you begin having high or low blood pressures or blood sugars.  Feel free to call with any questions or concerns!  Arbutus Leas, PharmD, BCPS, CPP Clinical Pharmacist Pract[facility] [place] [phone]

## 2023-05-11 ENCOUNTER — Other Ambulatory Visit: Payer: Self-pay | Admitting: Internal Medicine

## 2023-05-11 DIAGNOSIS — E1151 Type 2 diabetes mellitus with diabetic peripheral angiopathy without gangrene: Secondary | ICD-10-CM

## 2023-05-11 DIAGNOSIS — I739 Peripheral vascular disease, unspecified: Secondary | ICD-10-CM | POA: Diagnosis not present

## 2023-05-11 DIAGNOSIS — L603 Nail dystrophy: Secondary | ICD-10-CM | POA: Diagnosis not present

## 2023-05-11 DIAGNOSIS — L84 Corns and callosities: Secondary | ICD-10-CM | POA: Diagnosis not present

## 2023-05-11 NOTE — Telephone Encounter (Signed)
Please place this referral.

## 2023-05-25 ENCOUNTER — Other Ambulatory Visit: Payer: Self-pay | Admitting: Internal Medicine

## 2023-05-25 DIAGNOSIS — I10 Essential (primary) hypertension: Secondary | ICD-10-CM

## 2023-05-28 MED ORDER — SPIRONOLACTONE 25 MG PO TABS: 25.0000 mg | ORAL_TABLET | Freq: Every day | ORAL | 0 refills | Status: DC

## 2023-05-29 ENCOUNTER — Other Ambulatory Visit: Payer: Self-pay | Admitting: Internal Medicine

## 2023-05-29 DIAGNOSIS — E1151 Type 2 diabetes mellitus with diabetic peripheral angiopathy without gangrene: Secondary | ICD-10-CM

## 2023-06-12 ENCOUNTER — Other Ambulatory Visit: Payer: Self-pay

## 2023-06-25 ENCOUNTER — Other Ambulatory Visit: Payer: Self-pay

## 2023-06-25 ENCOUNTER — Emergency Department (HOSPITAL_COMMUNITY)
Admission: EM | Admit: 2023-06-25 | Discharge: 2023-06-25 | Disposition: A | Discharge status: Home / Self Care | Attending: Emergency Medicine | Admitting: Emergency Medicine

## 2023-06-25 DIAGNOSIS — Z7982 Long term (current) use of aspirin: Secondary | ICD-10-CM | POA: Diagnosis not present

## 2023-06-25 DIAGNOSIS — I10 Essential (primary) hypertension: Secondary | ICD-10-CM | POA: Insufficient documentation

## 2023-06-25 DIAGNOSIS — E119 Type 2 diabetes mellitus without complications: Secondary | ICD-10-CM | POA: Insufficient documentation

## 2023-06-25 DIAGNOSIS — R42 Dizziness and giddiness: Secondary | ICD-10-CM | POA: Insufficient documentation

## 2023-06-25 DIAGNOSIS — D72829 Elevated white blood cell count, unspecified: Secondary | ICD-10-CM | POA: Diagnosis not present

## 2023-06-25 DIAGNOSIS — Z79899 Other long term (current) drug therapy: Secondary | ICD-10-CM | POA: Diagnosis not present

## 2023-06-25 LAB — COMPREHENSIVE METABOLIC PANEL WITH GFR
ALT: 17 U/L (ref 0–44)
AST: 21 U/L (ref 15–41)
Albumin: 3.8 g/dL (ref 3.5–5.0)
Alkaline Phosphatase: 68 U/L (ref 38–126)
Anion gap: 10 (ref 5–15)
BUN: 9 mg/dL (ref 8–23)
CO2: 23 mmol/L (ref 22–32)
Calcium: 9.6 mg/dL (ref 8.9–10.3)
Chloride: 105 mmol/L (ref 98–111)
Creatinine, Ser: 1.09 mg/dL — ABNORMAL HIGH (ref 0.44–1.00)
GFR, Estimated: 55 mL/min — ABNORMAL LOW (ref >60)
Glucose, Bld: 147 mg/dL — ABNORMAL HIGH (ref 70–99)
Potassium: 4.1 mmol/L (ref 3.5–5.1)
Sodium: 138 mmol/L (ref 135–145)
Total Bilirubin: 0.9 mg/dL (ref 0.0–1.2)
Total Protein: 7.2 g/dL (ref 6.5–8.1)

## 2023-06-25 LAB — CBC
HCT: 40.7 % (ref 36.0–46.0)
Hemoglobin: 13.3 g/dL (ref 12.0–15.0)
MCH: 29.1 pg (ref 26.0–34.0)
MCHC: 32.7 g/dL (ref 30.0–36.0)
MCV: 89.1 fL (ref 80.0–100.0)
Platelets: 359 10*3/uL (ref 150–400)
RBC: 4.57 MIL/uL (ref 3.87–5.11)
RDW: 14.6 % (ref 11.5–15.5)
WBC: 14 10*3/uL — ABNORMAL HIGH (ref 4.0–10.5)
nRBC: 0 % (ref 0.0–0.2)

## 2023-06-25 LAB — CBG MONITORING, ED: Glucose-Capillary: 144 mg/dL — ABNORMAL HIGH (ref 70–99)

## 2023-06-25 MED ORDER — MECLIZINE HCL 25 MG PO TABS
25.0000 mg | ORAL_TABLET | Freq: Once | ORAL | Status: AC
Start: 2023-06-25 — End: 2023-06-25
  Administered 2023-06-25: 25 mg via ORAL
  Filled 2023-06-25: qty 1

## 2023-06-25 MED ORDER — SODIUM CHLORIDE 0.9 % IV BOLUS
1000.0000 mL | Freq: Once | INTRAVENOUS | Status: AC
  Administered 2023-06-25: 1000 mL via INTRAVENOUS

## 2023-06-25 MED ORDER — DIAZEPAM 5 MG PO TABS
5.0000 mg | ORAL_TABLET | Freq: Once | ORAL | Status: AC
  Administered 2023-06-25: 5 mg via ORAL
  Filled 2023-06-25: qty 1

## 2023-06-25 MED ORDER — MECLIZINE HCL 25 MG PO TABS
25.0000 mg | ORAL_TABLET | Freq: Three times a day (TID) | ORAL | 0 refills | Status: AC
Start: 2023-06-25 — End: ?

## 2023-06-25 NOTE — Discharge Instructions (Addendum)
 You are seen in the ER for dizziness. We suspect that your spinning sensation is because of peripheral vertigo.  Take the medications that are prescribed and perform the Epley maneuvers at home every 2 hours for 15 minutes. We recommend that you follow-up with your primary care doctor in 1 week.  Return to the ER if you start having severe dizziness with associated one-sided weakness, numbness, slurring of your speech, difficulty in swallowing, double vision or loss of vision.

## 2023-06-25 NOTE — ED Provider Notes (Signed)
 [place] EMERGENCY DEPARTMENT AT [facility] Provider Note   CSN: [id number] Arrival date & time: 06/25/23  1544     History  Chief Complaint  Patient presents with   Dizziness    Ashley Price is a 68 y.o. female.  HPI    68 year old female with history of diabetes, DVT, hypertension, PVD and ?  Stroke comes in with chief complaint of dizziness.  Patient states that around 1:00 in the afternoon, she was watching television when she suddenly started feeling dizzy.  Dizziness is described as spinning sensation.  Patient decided to lay down when that occurred, and in few minutes her symptoms resolved.  Thereafter she noted that her symptoms will come back if she would turn her head or if she would sit up and lean forward.  Patient was feeling dizzy even in the waiting room and she was sitting and leaning forward.  However, now she is not having any dizziness and the dizziness is not reproduced with any movement.  Patient denies any associated difficulty in swallowing, slurred speech, confusion, double vision, loss of vision, one-sided weakness or numbness.  Patient denies any recent illnesses, ringing in the ear, earache, sinus infection. Home Medications Prior to Admission medications   Medication Sig Start Date End Date Taking? Authorizing Provider  meclizine  (ANTIVERT ) 25 MG tablet Take 1 tablet (25 mg total) by mouth 3 (three) times daily. 06/25/23  Yes Cherisse Carrell, MD  amLODipine  (NORVASC ) 5 MG tablet TAKE 1 TABLET (5 MG TOTAL) BY MOUTH DAILY. 04/02/23   Arcadio Knuckles, MD  aspirin  EC 81 MG tablet Take 1 tablet (81 mg total) by mouth daily. Swallow whole. 05/03/22   Arcadio Knuckles, MD  atorvastatin  (LIPITOR) 40 MG tablet Take 1 tablet (40 mg total) by mouth daily. 04/30/23   Arcadio Knuckles, MD  atorvastatin  (LIPITOR) 40 MG tablet TAKE 1 TABLET BY MOUTH EVERY DAY 04/30/23   Arcadio Knuckles, MD  Cyanocobalamin  (B-12 PO) Take 1 tablet by mouth daily.    [provider]  Glucagon  (GVOKE HYPOPEN  2-PACK) 1 MG/0.2ML SOAJ Inject 1 Act into the skin daily as needed. 11/23/22   Arcadio Knuckles, MD  losartan  (COZAAR ) 25 MG tablet Take 1 tablet (25 mg total) by mouth daily. 04/30/23   Arcadio Knuckles, MD  Semaglutide , 1 MG/DOSE, (OZEMPIC , 1 MG/DOSE,) 4 MG/3ML SOPN INJECT 1 MG ONCE A WEEK AS DIRECTED 06/12/23   Arcadio Knuckles, MD  spironolactone  (ALDACTONE ) 25 MG tablet Take 1 tablet (25 mg total) by mouth daily. 05/28/23   Arcadio Knuckles, MD  UNKNOWN TO PATIENT Take 1 tablet by mouth daily. 24 hour allergy medication    [provider]      Allergies    Patient has no known allergies.    Review of Systems   Review of Systems  All other systems reviewed and are negative.   Physical Exam Updated Vital Signs BP 127/71 (BP Location: Right Arm)   Pulse 60   Temp (!) 96.4 F (35.8 C) (Oral)   Resp 11   Wt 76 kg   SpO2 100%   BMI 29.68 kg/m  Physical Exam Vitals and nursing note reviewed.  Constitutional:      Appearance: She is well-developed.  HENT:     Head: Atraumatic.  Eyes:     Extraocular Movements: Extraocular movements intact.     Pupils: Pupils are equal, round, and reactive to light.     Comments: No  nystagmus, test of skew is reassuring, head impulse test showed no correction  Cardiovascular:     Rate and Rhythm: Normal rate.  Pulmonary:     Effort: Pulmonary effort is normal.  Musculoskeletal:     Cervical back: Normal range of motion and neck supple.  Skin:    General: Skin is warm and dry.  Neurological:     Mental Status: She is alert and oriented to person, place, and time.     Cranial Nerves: No cranial nerve deficit.     Sensory: No sensory deficit.     Motor: No weakness.     Coordination: Coordination normal.     Deep Tendon Reflexes: Reflexes normal.     ED Results / Procedures / Treatments   Labs (all labs ordered are listed, but only abnormal results are displayed) Labs Reviewed   COMPREHENSIVE METABOLIC PANEL WITH GFR - Abnormal; Notable for the following components:      Result Value   Glucose, Bld 147 (*)    Creatinine, Ser 1.09 (*)    GFR, Estimated 55 (*)    All other components within normal limits  CBC - Abnormal; Notable for the following components:   WBC 14.0 (*)    All other components within normal limits  CBG MONITORING, ED - Abnormal; Notable for the following components:   Glucose-Capillary 144 (*)    All other components within normal limits  URINALYSIS, ROUTINE W REFLEX MICROSCOPIC    EKG EKG Interpretation Date/Time:  Monday Jun 25 2023 16:07:02 EDT Ventricular Rate:  73 PR Interval:  160 QRS Duration:  90 QT Interval:  418 QTC Calculation: 460 R Axis:   -26  Text Interpretation: Sinus rhythm with marked sinus arrhythmia with occasional Premature ventricular complexes Low voltage QRS Cannot rule out Anterior infarct , age undetermined Abnormal ECG When compared with ECG of 22-Nov-2022 11:43, PREVIOUS ECG IS PRESENT Confirmed by Deatra Face [phone]) on 06/25/2023 6:58:30 PM  Radiology No results found.  Procedures Procedures    Medications Ordered in ED Medications  meclizine  (ANTIVERT ) tablet 25 mg (has no administration in time range)  diazepam (VALIUM) tablet 5 mg (5 mg Oral Given 06/25/23 1744)  sodium chloride  0.9 % bolus 1,000 mL (1,000 mLs Intravenous New Bag/Given 06/25/23 1849)    ED Course/ Medical Decision Making/ A&P                                 Medical Decision Making Amount and/or Complexity of Data Reviewed Labs: ordered.  Risk Prescription drug management.  This patient presents to the ED with chief complaint(s) of dizziness described as vertigo with pertinent past medical history of PAD, PVD, diabetes, hypertension. The complaint involves an extensive differential diagnosis and also carries with it a high risk of complications and morbidity.    The differential diagnosis includes: DDx  includes: Central vertigo:  Tumor  Stroke  ICH  Vertebrobasilar TIA  Peripheral Vertigo:  BPPV  Vestibular neuritis  Meniere disease  Migrainous vertigo  Ear Infection   The initial plan is to get basic labs, ambulate the patient and hydrate the patient. Clinically this seems like peripheral vertigo at this time.  We have given her Valium, but during my assessment patient did indicate that she is not having any dizziness at this point.  We will ambulate the patient, there is no ataxia that we will should be able to discharge her with a diagnosis of  peripheral vertigo.   Additional history obtained: Additional history obtained from family Records reviewed previous admission documents and medications.  Patient is not on any new medications per history.  Independent labs interpretation:  The following labs were independently interpreted: CBC, robotic profile is normal.  Treatment and Reassessment: Patient reassessed.  She received Valium.  We have ambulated the patient thereafter.  On my reassessment, patient states that she continues to feel well.  She is not having any dizziness with moving of her head.  Per nurse, patient ambulated without any problems.  Patient concurs.  She stable for discharge with PCP follow-up. I did consider MRI brain to rule out stroke, however patient is symptom-free in the ER and clinically it appears more likely that she has peripheral vertigo.    Final Clinical Impression(s) / ED Diagnoses Final diagnoses:  Vertigo    Rx / DC Orders ED Discharge Orders          Ordered    meclizine  (ANTIVERT ) 25 MG tablet  3 times daily        06/25/23 2105              Deatra Face, MD 06/25/23 2113

## 2023-06-25 NOTE — ED Notes (Signed)
 Pt was felt dizzy when she sat on the side of the bed, was still really dizzy to stand.

## 2023-06-25 NOTE — ED Triage Notes (Signed)
 Pt complains of dizziness that started this morning. States she was sittign watching tv and felt like room was spinning. Pt denies dizziness while sitting right now. Dizziness worsened when trying to stand. Denies any pain. Denies n/v

## 2023-06-25 NOTE — ED Notes (Addendum)
 Pt ambulated in hallway with a one person assist. Orthostatic vital signs completed, pt reported still feeling dizzy while changing from sitting to standing.

## 2023-06-29 ENCOUNTER — Telehealth: Payer: Self-pay | Admitting: Adult Health

## 2023-06-29 ENCOUNTER — Encounter: Payer: Self-pay | Admitting: Internal Medicine

## 2023-06-29 NOTE — Telephone Encounter (Signed)
 Pt Daughter called to schedule appt   Appt Scheduled

## 2023-07-04 ENCOUNTER — Other Ambulatory Visit: Payer: Self-pay | Admitting: Internal Medicine

## 2023-07-04 DIAGNOSIS — I5189 Other ill-defined heart diseases: Secondary | ICD-10-CM

## 2023-07-04 DIAGNOSIS — I10 Essential (primary) hypertension: Secondary | ICD-10-CM

## 2023-07-12 ENCOUNTER — Ambulatory Visit (INDEPENDENT_AMBULATORY_CARE_PROVIDER_SITE_OTHER): Admitting: Internal Medicine

## 2023-07-12 VITALS — BP 124/72 | HR 70 | Temp 97.4°F | Resp 16 | Ht 63.0 in | Wt 168.4 lb

## 2023-07-12 DIAGNOSIS — N1831 Chronic kidney disease, stage 3a: Secondary | ICD-10-CM

## 2023-07-12 DIAGNOSIS — Z7984 Long term (current) use of oral hypoglycemic drugs: Secondary | ICD-10-CM

## 2023-07-12 DIAGNOSIS — Z23 Encounter for immunization: Secondary | ICD-10-CM | POA: Diagnosis not present

## 2023-07-12 DIAGNOSIS — Z0000 Encounter for general adult medical examination without abnormal findings: Secondary | ICD-10-CM | POA: Diagnosis not present

## 2023-07-12 DIAGNOSIS — I739 Peripheral vascular disease, unspecified: Secondary | ICD-10-CM | POA: Diagnosis not present

## 2023-07-12 DIAGNOSIS — E1151 Type 2 diabetes mellitus with diabetic peripheral angiopathy without gangrene: Secondary | ICD-10-CM | POA: Diagnosis not present

## 2023-07-12 DIAGNOSIS — E785 Hyperlipidemia, unspecified: Secondary | ICD-10-CM

## 2023-07-12 DIAGNOSIS — Z0001 Encounter for general adult medical examination with abnormal findings: Secondary | ICD-10-CM

## 2023-07-12 LAB — URINALYSIS, ROUTINE W REFLEX MICROSCOPIC
Bilirubin Urine: NEGATIVE
Hgb urine dipstick: NEGATIVE
Ketones, ur: NEGATIVE
Nitrite: NEGATIVE
RBC / HPF: NONE SEEN
Specific Gravity, Urine: 1.01 (ref 1.000–1.030)
Total Protein, Urine: NEGATIVE
Urine Glucose: NEGATIVE
Urobilinogen, UA: 0.2 (ref 0.0–1.0)
pH: 6 (ref 5.0–8.0)

## 2023-07-12 LAB — HEMOGLOBIN A1C
Hgb A1c MFr Bld: 7 % — ABNORMAL HIGH
Mean Plasma Glucose: 154 mg/dL
eAG (mmol/L): 8.5 mmol/L

## 2023-07-12 LAB — MICROALBUMIN / CREATININE URINE RATIO
Creatinine,U: 64 mg/dL
Microalb Creat Ratio: 23.1 mg/g (ref 0.0–30.0)
Microalb, Ur: 1.5 mg/dL (ref 0.0–1.9)

## 2023-07-12 MED ORDER — SHINGRIX 50 MCG/0.5ML IM SUSR: 0.5000 mL | Freq: Once | INTRAMUSCULAR | 1 refills | Status: AC

## 2023-07-12 MED ORDER — ATORVASTATIN CALCIUM 40 MG PO TABS: 40.0000 mg | ORAL_TABLET | Freq: Every day | ORAL | 1 refills | Status: DC

## 2023-07-12 MED ORDER — ASPIRIN 81 MG PO TBEC: 81.0000 mg | DELAYED_RELEASE_TABLET | Freq: Every day | ORAL | 1 refills | Status: AC

## 2023-07-12 NOTE — Patient Instructions (Signed)

## 2023-07-12 NOTE — Progress Notes (Signed)
 Subjective:  Patient ID: Ashley Price, female    DOB: [birth year]  Age: 68 y.o. MRN: [id number]  CC: Medical Management of Chronic Issues (3 month follow up. No concerns ), Annual Exam, Hyperlipidemia, and Diabetes   HPI SHATOYA ROETS presents for f/up ---  Discussed the use of AI scribe software for clinical note transcription with the patient, who gave verbal consent to proceed.  History of Present Illness   Ashley Price is a 68 year old female who presents for a follow-up after experiencing dizziness and vertigo.  On May 26, she experienced dizziness and was taken to the emergency room. She was diagnosed with vertigo and treated with Dramamine for three days, after which the dizziness resolved. Increased water intake during this time may have contributed to her improvement. Ultrasound guidance was used for venous access due to dehydration.  Currently, she has no symptoms of dizziness, lightheadedness, chest pain, shortness of breath, swelling in her legs or feet, numbness, weakness, or tingling. She maintains her toenails regularly with the help of a podiatrist.  She has a history of smoking but quit approximately two years ago. She does not currently smoke.  Her last eye exam was in the previous year, and she has another scheduled for June 23.       Outpatient Medications Prior to Visit  Medication Sig Dispense Refill   amLODipine  (NORVASC ) 5 MG tablet TAKE 1 TABLET (5 MG TOTAL) BY MOUTH DAILY. 90 tablet 0   Cyanocobalamin  (B-12 PO) Take 1 tablet by mouth daily.     Glucagon  (GVOKE HYPOPEN  2-PACK) 1 MG/0.2ML SOAJ Inject 1 Act into the skin daily as needed. 2 mL 5   losartan  (COZAAR ) 25 MG tablet Take 1 tablet (25 mg total) by mouth daily. 90 tablet 0   meclizine  (ANTIVERT ) 25 MG tablet Take 1 tablet (25 mg total) by mouth 3 (three) times daily. 30 tablet 0   Semaglutide , 1 MG/DOSE, (OZEMPIC , 1 MG/DOSE,) 4 MG/3ML SOPN INJECT 1 MG ONCE A WEEK AS DIRECTED 9 mL 0   spironolactone   (ALDACTONE ) 25 MG tablet Take 1 tablet (25 mg total) by mouth daily. 90 tablet 0   aspirin  EC 81 MG tablet Take 1 tablet (81 mg total) by mouth daily. Swallow whole. 90 tablet 1   atorvastatin  (LIPITOR) 40 MG tablet Take 1 tablet (40 mg total) by mouth daily. 90 tablet 0   atorvastatin  (LIPITOR) 40 MG tablet TAKE 1 TABLET BY MOUTH EVERY DAY 90 tablet 0   UNKNOWN TO PATIENT Take 1 tablet by mouth daily. 24 hour allergy medication     No facility-administered medications prior to visit.    ROS Review of Systems  Constitutional:  Negative for appetite change, chills, diaphoresis, fatigue and fever.  HENT: Negative.    Eyes: Negative.   Respiratory:  Negative for cough, chest tightness, shortness of breath and wheezing.   Cardiovascular:  Negative for chest pain, palpitations and leg swelling.  Gastrointestinal: Negative.  Negative for abdominal pain, constipation, diarrhea, nausea and vomiting.  Endocrine: Negative.   Genitourinary: Negative.  Negative for difficulty urinating.  Musculoskeletal: Negative.  Negative for arthralgias and myalgias.  Skin: Negative.  Negative for color change and rash.  Neurological:  Negative for dizziness and weakness.  Hematological:  Negative for adenopathy. Does not bruise/bleed easily.  Psychiatric/Behavioral: Negative.      Objective:  BP 124/72 (BP Location: Left Arm, Patient Position: Sitting, Cuff Size: Normal)   Pulse 70   Temp (!) 97.4  F (36.3 C) (Oral)   Resp 16   Ht 5' 3 (1.6 m)   Wt 168 lb 6.4 oz (76.4 kg)   SpO2 99%   BMI 29.83 kg/m   BP Readings from Last 3 Encounters:  07/12/23 124/72  06/25/23 127/71  03/14/23 112/74    Wt Readings from Last 3 Encounters:  07/12/23 168 lb 6.4 oz (76.4 kg)  06/25/23 167 lb 8.8 oz (76 kg)  03/14/23 168 lb (76.2 kg)    Physical Exam Vitals reviewed.  Constitutional:      Appearance: Normal appearance.  HENT:     Mouth/Throat:     Mouth: Mucous membranes are moist.   Eyes:      General: No scleral icterus.    Conjunctiva/sclera: Conjunctivae normal.    Cardiovascular:     Rate and Rhythm: Normal rate and regular rhythm.     Heart sounds: No murmur heard.    No friction rub. No gallop.  Pulmonary:     Effort: Pulmonary effort is normal.     Breath sounds: No stridor. No wheezing, rhonchi or rales.  Abdominal:     General: Abdomen is flat.     Palpations: There is no mass.     Tenderness: There is no abdominal tenderness. There is no guarding.     Hernia: No hernia is present.   Musculoskeletal:        General: Normal range of motion.     Cervical back: Neck supple.     Right lower leg: No edema.     Left lower leg: No edema.  Lymphadenopathy:     Cervical: No cervical adenopathy.   Skin:    General: Skin is warm and dry.   Neurological:     General: No focal deficit present.     Mental Status: She is alert. Mental status is at baseline.   Psychiatric:        Mood and Affect: Mood normal.        Behavior: Behavior normal.     Lab Results  Component Value Date   WBC 14.0 (H) 06/25/2023   HGB 13.3 06/25/2023   HCT 40.7 06/25/2023   PLT 359 06/25/2023   GLUCOSE 147 (H) 06/25/2023   CHOL 82 03/14/2023   TRIG 71.0 03/14/2023   HDL 37.90 (L) 03/14/2023   LDLCALC 30 03/14/2023   ALT 17 06/25/2023   AST 21 06/25/2023   NA 138 06/25/2023   K 4.1 06/25/2023   CL 105 06/25/2023   CREATININE 1.09 (H) 06/25/2023   BUN 9 06/25/2023   CO2 23 06/25/2023   TSH 1.46 03/14/2023   INR 1.2 07/29/2021   HGBA1C 7.0 (H) 07/12/2023   MICROALBUR 1.5 07/12/2023    No results found.  Assessment & Plan:  Type 2 diabetes mellitus with diabetic peripheral angiopathy without gangrene, without long-term current use of insulin  (HCC)- Blood sugar is well controlled. -     Urinalysis, Routine w reflex microscopic; Future -     Microalbumin / creatinine urine ratio; Future -     Hemoglobin A1c; Future -     HM Diabetes Foot Exam -     Dapagliflozin   Propanediol; Take 1 tablet (10 mg total) by mouth daily before breakfast.  Dispense: 90 tablet; Refill: 1  Stage 3a chronic kidney disease (HCC)- Will add an SGLT2-inh for renal protection. -     Urinalysis, Routine w reflex microscopic; Future -     Microalbumin / creatinine urine ratio; Future -  Dapagliflozin  Propanediol; Take 1 tablet (10 mg total) by mouth daily before breakfast.  Dispense: 90 tablet; Refill: 1  Need for prophylactic vaccination and inoculation against varicella -     Shingrix ; Inject 0.5 mLs into the muscle once for 1 dose.  Dispense: 0.5 mL; Refill: 1  Encounter for general adult medical examination with abnormal findings- Exam completed, labs reviewed, vaccines reviewed and updated, cancer screenings are UTD, pt ed material was given.   Dyslipidemia, goal LDL below 70 -     Atorvastatin  Calcium ; Take 1 tablet (40 mg total) by mouth daily.  Dispense: 90 tablet; Refill: 1  Peripheral vascular disease (HCC) -     Atorvastatin  Calcium ; Take 1 tablet (40 mg total) by mouth daily.  Dispense: 90 tablet; Refill: 1  PAD (peripheral artery disease) (HCC) -     Aspirin ; Take 1 tablet (81 mg total) by mouth daily. Swallow whole.  Dispense: 90 tablet; Refill: 1  Immunization due -     Pneumococcal conjugate vaccine 20-valent     Follow-up: Return in about 6 months (around 01/11/2024).  Sandra Crouch, MD

## 2023-07-13 ENCOUNTER — Ambulatory Visit: Payer: Self-pay | Admitting: Internal Medicine

## 2023-07-13 MED ORDER — DAPAGLIFLOZIN PROPANEDIOL 10 MG PO TABS: 10.0000 mg | ORAL_TABLET | Freq: Every day | ORAL | 1 refills | Status: AC

## 2023-07-20 ENCOUNTER — Telehealth: Payer: Self-pay | Admitting: *Deleted

## 2023-07-20 NOTE — Progress Notes (Signed)
 Care Guide Pharmacy Note  07/20/2023 Name: Ashley Price MRN: [id number] DOB: 1955/04/03  Referred By: Arcadio Knuckles, MD Reason for referral: Call Attempt #1 and Complex Care Management (Outreach to schedule referral with pharmacist )   Ashley Price is a 68 y.o. year old female who is a primary care patient of Arcadio Knuckles, MD.  Ashley Price was referred to the pharmacist for assistance related to: DMII  Successful contact was made with the patient to discuss pharmacy services including being ready for the pharmacist to call at least 5 minutes before the scheduled appointment time and to have medication bottles and any blood pressure readings ready for review. The patient agreed to meet with the pharmacist via telephone visit on 07/24/2023  Ashley Price, CMA [place]  [facility], [facility] Guide Direct Dial: [phone]  Fax: [phone] Website: [email or web address]

## 2023-07-24 ENCOUNTER — Other Ambulatory Visit (INDEPENDENT_AMBULATORY_CARE_PROVIDER_SITE_OTHER): Admitting: Pharmacist

## 2023-07-24 DIAGNOSIS — E1151 Type 2 diabetes mellitus with diabetic peripheral angiopathy without gangrene: Secondary | ICD-10-CM

## 2023-07-24 DIAGNOSIS — N1831 Chronic kidney disease, stage 3a: Secondary | ICD-10-CM

## 2023-07-24 NOTE — Progress Notes (Unsigned)
 07/24/2023 Name: Ashley Price MRN: [id number] DOB: 1956/01/02  Chief Complaint  Patient presents with   Diabetes   Medication Management     Ashley Price is a 68 y.o. year old female who presented for a telephone visit. Spoke to patient and her daughter, Ashley Price.   They were referred to the pharmacist by their PCP for assistance in managing diabetes.    Subjective:  Care Team: Primary Care Provider: Joshua Debby CROME, MD ; Next Scheduled Visit: none scheduled   Medication Access/Adherence  Current Pharmacy:  [id number] - [place], [place] - [address] [place] [address] Phone: [phone] Fax: [phone]   Patient reports affordability concerns with their medications: No  Patient reports access/transportation concerns to their pharmacy: No  Patient reports adherence concerns with their medications:  No     Diabetes:  Current medications: Ozempic  1 mg weekly (increased 3/3) Medications tried in the past: Farxiga  - trichomonas infection, metformin  (N/V when taking with Ozempic )  Current glucose readings: BG 114 most recent, sometimes after eating will go up to 190. Notes she has seen none above 200  Using glucometer; testing 2 times daily   Current meal patterns:  - Notes she does not eat a lot in one sitting, tends to graze/snack - Notes she stopped drinking soda/juice after PCP appt last week/A1c result - Reports not great at drinking water, has increased - Reports no significant change in diet but does note weight gain of about 11 lbs  Current physical activity: limited due to arthritis in knees  Current medication access support: none, pt has Medicaid  Hypertension: Current medications: spironolactone  25 mg daily, amlodipine  5 mg daily (started 12/9), losartan  25 mg daily (started 02/05/23) Previous Medications: lisinopril  was stopped due to concern for hypotension at October PCP appt  Home BP readings:  BP  120-130/70-80  Objective:  Lab Results  Component Value Date   HGBA1C 7.0 (H) 07/12/2023    Lab Results  Component Value Date   CREATININE 1.09 (H) 06/25/2023   BUN 9 06/25/2023   NA 138 06/25/2023   K 4.1 06/25/2023   CL 105 06/25/2023   CO2 23 06/25/2023    Lab Results  Component Value Date   CHOL 82 03/14/2023   HDL 37.90 (L) 03/14/2023   LDLCALC 30 03/14/2023   TRIG 71.0 03/14/2023   CHOLHDL 2 03/14/2023    Medications Reviewed Today     Reviewed by Ashley Price, RPH (Pharmacist) on 07/24/23 at 1658  Med List Status: <None>   Medication Order Taking? Sig Documenting Provider Last Dose Status Informant  amLODipine  (NORVASC ) 5 MG tablet [id number] Yes TAKE 1 TABLET (5 MG TOTAL) BY MOUTH DAILY. Joshua Debby CROME, MD  Active   aspirin  EC 81 MG tablet [id number] Yes Take 1 tablet (81 mg total) by mouth daily. Swallow whole. Joshua Debby CROME, MD  Active   atorvastatin  (LIPITOR) 40 MG tablet [id number] Yes Take 1 tablet (40 mg total) by mouth daily. Joshua Debby CROME, MD  Active   Cyanocobalamin  (B-12 PO) [id number]  Take 1 tablet by mouth daily. [provider]  Active   dapagliflozin  propanediol (FARXIGA ) 10 MG TABS tablet [id number]  Take 1 tablet (10 mg total) by mouth daily before breakfast.  Patient not taking: Reported on 07/24/2023   Joshua Debby CROME, MD  Active   Glucagon  (GVOKE HYPOPEN  2-PACK) 1 MG/0.2ML EMMANUEL [id number]  Inject 1 Act into the skin daily as needed. Joshua Debby CROME, MD  Active   losartan  (COZAAR ) 25 MG tablet [id number] Yes Take 1 tablet (25 mg total) by mouth daily. Joshua Debby CROME, MD  Active   meclizine  (ANTIVERT ) 25 MG tablet [id number]  Take 1 tablet (25 mg total) by mouth 3 (three) times daily. Charlyn Sora, MD  Active   Semaglutide , 1 MG/DOSE, (OZEMPIC , 1 MG/DOSE,) 4 MG/3ML SOPN [id number] Yes INJECT 1 MG ONCE A WEEK AS DIRECTED Joshua Debby CROME, MD  Active   spironolactone  (ALDACTONE ) 25 MG tablet [id number] Yes Take 1 tablet (25 mg total) by  mouth daily. Joshua Debby CROME, MD  Active               Assessment/Plan:   Diabetes: - Currently uncontrolled, A1c goal <7%. A1c improved 9.3 to 7.3% on Ozempic  - Continue Ozempic  1 mg weekly - Recommend to check glucose twice daily  Hypertension: - Currently controlled. BP goal <130/80.  - ARB preferred given recent dx of HFpEF per 01/05/23 echo - Continue current regimen - Scr and K were WNL, stable on BMP 2/12  Follow Up Plan: PRN  Darrelyn Drum, PharmD, BCPS Clinical Pharmacist [facility] [phone]

## 2023-07-25 NOTE — Patient Instructions (Signed)
 It was a pleasure speaking with you today!  Continue ozempic  1 mg weekly - if having difficulty eating enough due to appetite suppression, give me a call for medication adjustment.  Start Farxiga . Monitor for signs and symptoms of yeast infection (itching, discharge) and UTI (urinary frequency, pain with urination, fever, back pain).  Feel free to call with any questions or concerns!  Darrelyn Drum, PharmD, BCPS, CPP Clinical Pharmacist Practitioner [place] [phone] '

## 2023-07-29 ENCOUNTER — Other Ambulatory Visit: Payer: Self-pay | Admitting: Internal Medicine

## 2023-07-29 DIAGNOSIS — I10 Essential (primary) hypertension: Secondary | ICD-10-CM

## 2023-07-31 DIAGNOSIS — E119 Type 2 diabetes mellitus without complications: Secondary | ICD-10-CM | POA: Diagnosis not present

## 2023-07-31 DIAGNOSIS — H524 Presbyopia: Secondary | ICD-10-CM | POA: Diagnosis not present

## 2023-07-31 DIAGNOSIS — Z961 Presence of intraocular lens: Secondary | ICD-10-CM | POA: Diagnosis not present

## 2023-07-31 DIAGNOSIS — H2511 Age-related nuclear cataract, right eye: Secondary | ICD-10-CM | POA: Diagnosis not present

## 2023-07-31 LAB — HM DIABETES EYE EXAM

## 2023-08-07 ENCOUNTER — Other Ambulatory Visit: Payer: Self-pay | Admitting: Internal Medicine

## 2023-08-07 DIAGNOSIS — E1151 Type 2 diabetes mellitus with diabetic peripheral angiopathy without gangrene: Secondary | ICD-10-CM

## 2023-08-14 ENCOUNTER — Encounter: Payer: Self-pay | Admitting: Podiatry

## 2023-08-14 ENCOUNTER — Ambulatory Visit (INDEPENDENT_AMBULATORY_CARE_PROVIDER_SITE_OTHER): Admitting: Podiatry

## 2023-08-14 DIAGNOSIS — B351 Tinea unguium: Secondary | ICD-10-CM

## 2023-08-14 DIAGNOSIS — M79672 Pain in left foot: Secondary | ICD-10-CM | POA: Diagnosis not present

## 2023-08-14 DIAGNOSIS — E1151 Type 2 diabetes mellitus with diabetic peripheral angiopathy without gangrene: Secondary | ICD-10-CM | POA: Diagnosis not present

## 2023-08-14 DIAGNOSIS — I70209 Unspecified atherosclerosis of native arteries of extremities, unspecified extremity: Secondary | ICD-10-CM

## 2023-08-14 DIAGNOSIS — M79671 Pain in right foot: Secondary | ICD-10-CM

## 2023-08-14 NOTE — Progress Notes (Signed)
 Patient presents for evaluation and treatment of tenderness and some redness around nails feet.  Tenderness around toes with walking and wearing shoes.  Physical exam:  General appearance: Alert, pleasant, and in no acute distress.  Vascular: Pedal pulses: DP 1/4 B/L, PT 0/4 B/L. Moderate edema lower legs bilaterally  Neurological:    Dermatologic:  Nails thickened, disfigured, discolored 1-5 BL with subungual debris.  Redness and hypertrophic nail folds along nail folds bilaterally but no signs of drainage or infection.  Musculoskeletal:     Diagnosis: 1. Painful onychomycotic nails 1 through 5 bilaterally. 2. Pain toes 1 through 5 bilaterally. 3.  Type 2 diabetes with PVD  Plan: Debrided onychomycotic nails 1 through 5 bilaterally.  Return 3 months [facility]

## 2023-08-16 NOTE — Progress Notes (Signed)
 [facility] [address] [place]. [place] [address] [phone]       OFFICE FOLLOW-UP VISIT NOTE  Ms. ALISCIA CLAYTON Date of Birth:  May 17, 1955 Medical Record Number:  [id number]    Primary neurologist: Dr. Rosemarie Reason for visit: Vertigo   Chief Complaint  Patient presents with   Memory Loss    Rm 3 with daughter  Pt is well, daughter reports on May 26 patient had sever dizzy spells and had to go to ED. She also reports she has had an increase in confusion.       HPI:   Update 08/16/2023 JM: patient returns for follow up visit accompanied by her daughter after episode of dizziness back in May, was seen in the ED and felt more related to peripheral vertigo as symptoms present with quick head movement with resolution of symptoms during ED visit.  Neuroexam intact.  She received Valium  without any recurrent symptoms.  Did not feel MRI indicated as she remains symptom-free and clinically felt more related to peripheral vertigo as well as underlying dehydration.  She was prescribed meclizine  at discharge and discussed increased water intake.  Reports episodes still occur occasionally but have overall improved especially since increasing water intake. Previously drinking maybe 1/2 bottle of water per day, prefers to drink soft drinks. Episodes still occur primarily with turning head from side-to-side and resolves quickly after. Has used meclizine  3 times over the past 2 months with benefit but does cause drowsiness.  Denies one-sided weakness, numbness/tingling, changes in speech, gait or vision.  Daughter does report being confused with episode back in May, she woke up thinking she needed to go to work although she retired in 2011. At times can still become confused but not always associated with vertigo episodes.  Does have underlying mild cognitive impairment for which she has previously been seen for but denies any significant worsening overall. MMSE 26/30, previously  25/30.  Continues to ambulate with a cane, no recent falls.  Continues to maintain ADLs independently.  Daughter assists with IADLs.  No behavioral concerns.  Reports compliance on aspirin  and atorvastatin .  She routinely follows with PCP for stroke risk factor management.      History provided for reference purposes only Update 08/15/2022 JM: Returns for follow-up visit.  Accompanied by her daughter, Rosina.  Reports cognition has been stable since prior visit.  Continues to have short-term memory difficulties. Such as forgetting if she already ate lunch or dinner, at times will forget she is in the middle of cooking.  Lives with her daughters, able to maintain ADLs independently, can assist with some IADLs but mostly are managed by daughters.  Denies any behavioral concerns.  Routinely follows with PCP.  Remains on aspirin  and atorvastatin  as well as Xarelto .  No new concerns at this time.  Update 02/07/2022 Dr. Rosemarie: She returns for follow-up after last visit 4 months ago.  She is accompanied by her daughter.  Patient states that she is doing well.  Continues to have short-term memory difficulties which are unchanged.  He has poor short-term memory and often repeats herself and asked the same question she has not had any stroke or TIA symptoms.  She had lab work done at last visit vitamin B12, TSH, RPR and homocystine all normal.  Hemoglobin A1c was 6.0.  MRI scan of the brain on 10/27/2021 showed no acute abnormalities but old bilateral lacunar infarcts involving basal ganglia, corona radiata and cerebellum.  MR angiogram of the brain and  neck both did not show large vessel stenosis or occlusion.  She tripped and had a fall and was seen in the ER on 12/23/2018.  CT scan of the head was obtained which showed no acute abnormalities.  She remains on aspirin  81 mg daily as well as Xarelto  tolerating well with minor bruising and no bleeding.  Initial visit 10/06/2021 Dr. Rosemarie: Ms. Willis is a 68 year old  pleasant African-American lady seen today for office consultation visit for memory loss.  She is accompanied by her daughter.  History is obtained from them and review of referral notes and electronic medical records and opossum reviewed pertinent available imaging films in PACS.  She has past medical history of diabetes, pretension, hyperlipidemia, osteoarthritis and right medullary infarct in November 2020.  The patient has developed some memory and cognitive difficulties since December of last year.  She has had intermittent confusion and disorientation off-and-on.  She is challenged with learning any new technology.  For example she tried to order a meal through [employer] app and could not figure out why it was not working.  Short-term memory is quite poor and she often needs information to be repeated multiple times.  She has been under increased stress for the last 1 year as she has lost her daughter, uncle and husband and may be in significant grief.  The daughter reports an episode in June this year when she was found to be staring into space with a glazed look on her face.  This was transient and she recovered quickly.  She saw her primary care physician who actually referred her to the ER where actually she was found to have a DVT in the legs and started on Xarelto  which she is still taking.  She was started on Aricept but she has not been consistently taking it as per the daughter.  She was last seen in the office on 05/21/2019 by Harlene nurse practitioner.  Since then she had a CT scan of the head on 07/29/2021 which shows mild generalized atrophy and changes of small vessel disease.  Old bilateral basal ganglia lacunar infarcts are noted.  No acute abnormalities noted.  She had echocardiogram in 07/30/2021 which showed ejection fraction of 60 to 65%.  LDL cholesterol on 07/30/2021 was 32 mg percent.  She remains on aspirin  81 mg which is tolerating well without bleeding or bruising.  She is  also tolerating Lipitor 40 mg well without any side effects.  She remains on Glucophage  for diabetes which appears well controlled.  She is now on Balto and is having minor bruising but no bleeding episodes.  On Mini-Mental status exam today she scored 28/30 with only deficits in recall.  On geriatric depression scale she was not depressed with a score of only 1.     ROS:   14 system review of systems is positive for those listed in HPI and all other systems negative  PMH:  Past Medical History:  Diagnosis Date   Hypertension    Osteoarthritis    Stroke [facility])     Social History:  Social History   Socioeconomic History   Marital status: Single    Spouse name: Not on file   Number of children: Not on file   Years of education: Not on file   Highest education level: Some college, no degree  Occupational History   Not on file  Tobacco Use   Smoking status: Former    Current packs/day: 0.50    Average  packs/day: 0.7 packs/day for 40.7 years (30.4 ttl pk-yrs)    Types: Cigarettes    Start date: 12/30/1978    Quit date: 07/01/2022    Passive exposure: Current (states she last smoked on 11/22/22 and she smoked 4/5 cigarettes per day)   Smokeless tobacco: Never  Vaping Use   Vaping status: Never Used  Substance and Sexual Activity   Alcohol use: No   Drug use: No   Sexual activity: Not on file  Other Topics Concern   Not on file  Social History Narrative   Lives with her children   Social Drivers of Health   Financial Resource Strain: Low Risk  (07/12/2023)   Overall Financial Resource Strain (CARDIA)    Difficulty of Paying Living Expenses: Not hard at all  Food Insecurity: No Food Insecurity (07/12/2023)   Hunger Vital Sign    Worried About Running Out of Food in the Last Year: Never true    Ran Out of Food in the Last Year: Never true  Transportation Needs: No Transportation Needs (07/12/2023)   PRAPARE - Administrator, Civil Service (Medical): No    Lack  of Transportation (Non-Medical): No  Physical Activity: Inactive (07/12/2023)   Exercise Vital Sign    Days of Exercise per Week: 0 days    Minutes of Exercise per Session: Not on file  Stress: No Stress Concern Present (07/12/2023)   [employer] of Occupational Health - Occupational Stress Questionnaire    Feeling of Stress: Not at all  Social Connections: Moderately Integrated (07/12/2023)   Social Connection and Isolation Panel    Frequency of Communication with Friends and Family: More than three times a week    Frequency of Social Gatherings with Friends and Family: Three times a week    Attends Religious Services: More than 4 times per year    Active Member of Clubs or Organizations: Yes    Attends Banker Meetings: More than 4 times per year    Marital Status: Never married  Intimate Partner Violence: Not At Risk (03/01/2023)   Humiliation, Afraid, Rape, and Kick questionnaire    Fear of Current or Ex-Partner: No    Emotionally Abused: No    Physically Abused: No    Sexually Abused: No    Medications:   Current Outpatient Medications on File Prior to Visit  Medication Sig Dispense Refill   amLODipine  (NORVASC ) 5 MG tablet TAKE 1 TABLET (5 MG TOTAL) BY MOUTH DAILY. 90 tablet 0   aspirin  EC 81 MG tablet Take 1 tablet (81 mg total) by mouth daily. Swallow whole. 90 tablet 1   atorvastatin  (LIPITOR) 40 MG tablet Take 1 tablet (40 mg total) by mouth daily. 90 tablet 1   Cyanocobalamin  (B-12 PO) Take 1 tablet by mouth daily.     dapagliflozin  propanediol (FARXIGA ) 10 MG TABS tablet Take 1 tablet (10 mg total) by mouth daily before breakfast. 90 tablet 1   losartan  (COZAAR ) 25 MG tablet TAKE 1 TABLET (25 MG TOTAL) BY MOUTH DAILY. 90 tablet 0   meclizine  (ANTIVERT ) 25 MG tablet Take 1 tablet (25 mg total) by mouth 3 (three) times daily. 30 tablet 0   Semaglutide , 1 MG/DOSE, (OZEMPIC , 1 MG/DOSE,) 4 MG/3ML SOPN INJECT 1 MG ONCE A WEEK AS DIRECTED 9 mL 0   spironolactone   (ALDACTONE ) 25 MG tablet Take 1 tablet (25 mg total) by mouth daily. 90 tablet 0   No current facility-administered medications on file prior to visit.  Allergies:  No Known Allergies  Physical Exam Today's Vitals   08/20/23 0751  BP: 126/83  Pulse: 92  Weight: 158 lb (71.7 kg)  Height: 5' 3 (1.6 m)   Body mass index is 27.99 kg/m.  General: well developed, well nourished, very pleasant middle-aged African-American lady seated, in no evident distress  Neurologic Exam Mental Status: Awake and fully alert. Oriented to place and time. Recent memory impaired and remote memory intact.  Attention span, concentration and fund of knowledge appropriate during visit. Mood and affect appropriate.  Cranial Nerves: Pupils equal, briskly reactive to light. Extraocular movements full without nystagmus. Visual fields full to confrontation. Hearing intact. Facial sensation intact. Face, tongue, palate moves normally and symmetrically.  Motor: Normal bulk and tone. Normal strength in all tested extremity muscles. Sensory.: intact to touch , pinprick , position and vibratory sensation.  Coordination: Rapid alternating movements normal in all extremities. Finger-to-nose and heel-to-shin performed accurately bilaterally. Gait and Station: Arises from chair without difficulty. Stance is normal.  Antalgic gait with use of cane.  Tandem walk and heel toe not attempted Reflexes: 1+ and symmetric. Toes downgoing.      08/20/2023    7:52 AM 08/15/2022    2:25 PM 10/06/2021    8:17 AM  MMSE - Mini Mental State Exam  Orientation to time 5 3 5   Orientation to Place 5 5 5   Registration 3 3 3   Attention/ Calculation 3 3 5   Recall 2 2 1   Language- name 2 objects 2 2 2   Language- repeat 1 1 1   Language- follow 3 step command 3 3 3   Language- read & follow direction 1 1 1   Write a sentence 1 1 1   Copy design 0 1 1  Total score 26 25 28          ASSESSMENT: 68 year old African-American lady with  episode of dizziness 05/2023 and felt related more to peripheral vertigo after ED evaluation. Has had a couple more episodes since then, usually triggered by turning head and quickly resolves. Daughter also mentions intermittent episodes of confusion.  Previously seen for progressive memory and cognitive difficulties suspected due to history of prior multiple strokes.    Vertigo Suspect more due to BPPV Offered vestibular rehab but declines interest at this time Continue meclizine  as needed, can try half tab as full tab causes drowsiness. Can also try nondrowsy Dramamine as needed Discussed importance of adequate water intake with striving for at least 64 oz per day Neuro exam intact and no other associated symptoms, do not feel repeat imaging needed at this time but may consider if symptoms become more persistent   Confusional episodes: MCI: Suspect more due to underlying cognitive impairment but recommend completion of EEG Advised to call if these should become more persistent MMSE today 26/30, previously 25/30 Again, discussed importance of routine cognitive and physical activity, adequate water intake, ensuring good sleep and healthy diet and management of vascular risk factors  Hx of stroke: Continue aspirin  and atorvastatin  for stroke prevention managed/monitored by PCP Continue close PCP follow-up for aggressive stroke risk factor management including BP goal<130/90, HLD with LDL goal<70 and DM with A1c.<7     No further recommendations at this time and advised to call with any questions or concerns in the future   I personally spent a total of 45 minutes in the care of the patient today including preparing to see the patient, getting/reviewing separately obtained history, performing a medically appropriate exam/evaluation, counseling and educating, placing orders,  and documenting clinical information in the EHR.  Time also spent reviewing hospitalization back in May  Harlene Bogaert, [facility]  [facility] [address] [place], [place] [address]  Phone [phone] Fax [phone] Note: This document was prepared with digital dictation and possible smart phrase technology. Any transcriptional errors that result from this process are unintentional.

## 2023-08-17 ENCOUNTER — Telehealth: Payer: Self-pay | Admitting: Adult Health

## 2023-08-17 NOTE — Telephone Encounter (Signed)
 Appointment details confirmed

## 2023-08-20 ENCOUNTER — Encounter: Payer: Self-pay | Admitting: Adult Health

## 2023-08-20 ENCOUNTER — Ambulatory Visit (INDEPENDENT_AMBULATORY_CARE_PROVIDER_SITE_OTHER): Admitting: Adult Health

## 2023-08-20 VITALS — BP 126/83 | HR 92 | Ht 63.0 in | Wt 158.0 lb

## 2023-08-20 DIAGNOSIS — Z8673 Personal history of transient ischemic attack (TIA), and cerebral infarction without residual deficits: Secondary | ICD-10-CM | POA: Diagnosis not present

## 2023-08-20 DIAGNOSIS — R42 Dizziness and giddiness: Secondary | ICD-10-CM

## 2023-08-20 DIAGNOSIS — R410 Disorientation, unspecified: Secondary | ICD-10-CM | POA: Diagnosis not present

## 2023-08-20 DIAGNOSIS — G3184 Mild cognitive impairment, so stated: Secondary | ICD-10-CM

## 2023-08-20 NOTE — Patient Instructions (Addendum)
 Your Plan:  Suspect your symptoms are more due to BPPV (benign positional vertigo), if symptoms should worsen please let me know and we will proceed with vestibular therapy. Please try to avoid quick head movements. Continue to try to increase your water intake with ensuring at least 64 oz of water per day. Continue use of meclizine  as needed, can try half tab if full tab causes drowsiness. Can also try non drowsy dramamine as this may be better tolerated.   You will be scheduled to complete an EEG due to episodes of confusion         Thank you for coming to see us  at [facility]. I hope we have been able to provide you high quality care today.  You may receive a patient satisfaction survey over the next few weeks. We would appreciate your feedback and comments so that we may continue to improve ourselves and the health of our patients.   Benign Positional Vertigo Vertigo is the feeling that you or your surroundings are moving when they are not. Benign positional vertigo is the most common form of vertigo. This is usually a harmless condition (benign). This condition is positional. This means that symptoms are triggered by certain movements and positions. This condition can be dangerous if it occurs while you are doing something that could cause harm to yourself or others. This includes activities such as driving or operating machinery. What are the causes? The inner ear has fluid-filled canals that help your brain sense movement and balance. When the fluid moves, the brain receives messages about your body's position. With benign positional vertigo, calcium  crystals in the inner ear break free and disturb the inner ear area. This causes your brain to receive confusing messages about your body's position. What increases the risk? You are more likely to develop this condition if: You are a woman. You are 12 years of age or older. You have recently had a head injury. You  have an inner ear disease. What are the signs or symptoms? Symptoms of this condition usually happen when you move your head or your eyes in different directions. Symptoms may start suddenly and usually last for less than a minute. They include: Loss of balance and falling. Feeling like you are spinning or moving. Feeling like your surroundings are spinning or moving. Nausea and vomiting. Blurred vision. Dizziness. Involuntary eye movement (nystagmus). Symptoms can be mild and cause only minor problems, or they can be severe and interfere with daily life. Episodes of benign positional vertigo may return (recur) over time. Symptoms may also improve over time. How is this diagnosed? This condition may be diagnosed based on: Your medical history. A physical exam of the head, neck, and ears. Positional tests to check for or stimulate vertigo. You may be asked to turn your head and change positions, such as going from sitting to lying down. A health care provider will watch for symptoms of vertigo. You may be referred to a health care provider who specializes in ear, nose, and throat problems (ENT or otolaryngologist) or a provider who specializes in disorders of the nervous system (neurologist). How is this treated?  This condition may be treated in a session in which your health care provider moves your head in specific positions to help the displaced crystals in your inner ear move. Treatment for this condition may take several sessions. Surgery may be needed in severe cases, but this is rare. In some cases, benign positional vertigo may resolve on  its own in 2-4 weeks. Follow these instructions at home: Safety Move slowly. Avoid sudden body or head movements or certain positions, as told by your health care provider. Avoid driving or operating machinery until your health care provider says it is safe. Avoid doing any tasks that would be dangerous to you or others if vertigo occurs. If you  have trouble walking or keeping your balance, try using a cane for stability. If you feel dizzy or unstable, sit down right away. Return to your normal activities as told by your health care provider. Ask your health care provider what activities are safe for you. General instructions Take over-the-counter and prescription medicines only as told by your health care provider. Drink enough fluid to keep your urine pale yellow. Keep all follow-up visits. This is important. Contact a health care provider if: You have a fever. Your condition gets worse or you develop new symptoms. Your family or friends notice any behavioral changes. You have nausea or vomiting that gets worse. You have numbness or a prickling and tingling sensation. Get help right away if you: Have difficulty speaking or moving. Are always dizzy or faint. Develop severe headaches. Have weakness in your legs or arms. Have changes in your hearing or vision. Develop a stiff neck. Develop sensitivity to light. These symptoms may represent a serious problem that is an emergency. Do not wait to see if the symptoms will go away. Get medical help right away. Call your local emergency services (911 in the [place] Do not drive yourself to the hospital. Summary Vertigo is the feeling that you or your surroundings are moving when they are not. Benign positional vertigo is the most common form of vertigo. This condition is caused by calcium  crystals in the inner ear that become displaced. This causes a disturbance in an area of the inner ear that helps your brain sense movement and balance. Symptoms include loss of balance and falling, feeling that you or your surroundings are moving, nausea and vomiting, and blurred vision. This condition can be diagnosed based on symptoms, a physical exam, and positional tests. Follow safety instructions as told by your health care provider and keep all follow-up visits. This is important. This  information is not intended to replace advice given to you by your health care provider. Make sure you discuss any questions you have with your health care provider. Document Revised: 08/07/2022 Document Reviewed: 08/07/2022 Elsevier Patient Education  2024 [employer].

## 2023-08-23 ENCOUNTER — Ambulatory Visit (INDEPENDENT_AMBULATORY_CARE_PROVIDER_SITE_OTHER): Admitting: Neurology

## 2023-08-23 DIAGNOSIS — R410 Disorientation, unspecified: Secondary | ICD-10-CM

## 2023-08-23 DIAGNOSIS — R42 Dizziness and giddiness: Secondary | ICD-10-CM

## 2023-08-23 DIAGNOSIS — R4182 Altered mental status, unspecified: Secondary | ICD-10-CM | POA: Diagnosis not present

## 2023-08-28 ENCOUNTER — Other Ambulatory Visit (HOSPITAL_COMMUNITY): Payer: Self-pay

## 2023-08-28 ENCOUNTER — Ambulatory Visit: Payer: Self-pay | Admitting: Adult Health

## 2023-10-02 ENCOUNTER — Other Ambulatory Visit: Payer: Self-pay | Admitting: Internal Medicine

## 2023-10-02 DIAGNOSIS — I10 Essential (primary) hypertension: Secondary | ICD-10-CM

## 2023-10-02 DIAGNOSIS — I5189 Other ill-defined heart diseases: Secondary | ICD-10-CM

## 2023-10-20 ENCOUNTER — Other Ambulatory Visit: Payer: Self-pay | Admitting: Internal Medicine

## 2023-10-20 DIAGNOSIS — E1151 Type 2 diabetes mellitus with diabetic peripheral angiopathy without gangrene: Secondary | ICD-10-CM

## 2023-10-22 ENCOUNTER — Encounter: Payer: Self-pay | Admitting: Internal Medicine

## 2023-10-22 ENCOUNTER — Other Ambulatory Visit: Payer: Self-pay | Admitting: Internal Medicine

## 2023-10-22 DIAGNOSIS — E1151 Type 2 diabetes mellitus with diabetic peripheral angiopathy without gangrene: Secondary | ICD-10-CM

## 2023-10-22 MED ORDER — OZEMPIC (0.25 OR 0.5 MG/DOSE) 2 MG/3ML ~~LOC~~ SOPN: 0.5000 mg | PEN_INJECTOR | SUBCUTANEOUS | 0 refills | Status: DC

## 2023-10-22 NOTE — Telephone Encounter (Signed)
 Pt would like to go back to Ozempic  0.5 mg dosage

## 2023-10-26 ENCOUNTER — Other Ambulatory Visit: Payer: Self-pay | Admitting: Internal Medicine

## 2023-10-26 DIAGNOSIS — I10 Essential (primary) hypertension: Secondary | ICD-10-CM

## 2023-10-30 ENCOUNTER — Encounter: Payer: Self-pay | Admitting: Internal Medicine

## 2023-10-31 NOTE — Telephone Encounter (Signed)
 Copied from CRM [id number]. Topic: Clinical - Prescription Issue >> Oct 31, 2023  9:35 AM Berneda FALCON wrote: Reason for CRM: Patient calling to check on the status of the losartan  (COZAAR ) 25 MG tablet. This was requested by the pharmacy on 9/26  [id number] - [place], [place] - [address] [place] [address] Phone: [phone] Fax: [phone] Hours: Not open 24 hours

## 2023-11-01 ENCOUNTER — Other Ambulatory Visit: Payer: Self-pay

## 2023-11-01 DIAGNOSIS — I10 Essential (primary) hypertension: Secondary | ICD-10-CM

## 2023-11-01 MED ORDER — LOSARTAN POTASSIUM 25 MG PO TABS: 25.0000 mg | ORAL_TABLET | Freq: Every day | ORAL | 1 refills | Status: AC

## 2023-11-10 ENCOUNTER — Other Ambulatory Visit: Payer: Self-pay | Admitting: Internal Medicine

## 2023-11-10 DIAGNOSIS — I10 Essential (primary) hypertension: Secondary | ICD-10-CM

## 2023-11-14 ENCOUNTER — Ambulatory Visit: Admitting: Podiatry

## 2023-11-23 ENCOUNTER — Ambulatory Visit: Admitting: Podiatry

## 2023-11-23 ENCOUNTER — Ambulatory Visit (INDEPENDENT_AMBULATORY_CARE_PROVIDER_SITE_OTHER): Admitting: Podiatry

## 2023-11-23 DIAGNOSIS — M79672 Pain in left foot: Secondary | ICD-10-CM

## 2023-11-23 DIAGNOSIS — M79671 Pain in right foot: Secondary | ICD-10-CM | POA: Diagnosis not present

## 2023-11-23 DIAGNOSIS — B351 Tinea unguium: Secondary | ICD-10-CM | POA: Diagnosis not present

## 2023-11-23 NOTE — Progress Notes (Signed)
 Patient presents for evaluation and treatment of tenderness and some redness around nails feet.  Tenderness around toes with walking and wearing shoes.  Physical exam:  General appearance: Alert, pleasant, and in no acute distress.  Vascular: Pedal pulses: DP 1/4 B/L, PT 0/4 B/L. Moderate edema lower legs bilaterally  Neurologic:  Dermatologic:  Nails thickened, disfigured, discolored 1-5 BL with subungual debris.  Redness and hypertrophic nail folds along nail folds bilaterally but no signs of drainage or infection.  Musculoskeletal:     Diagnosis: 1. Painful onychomycotic nails 1 through 5 bilaterally. 2. Pain toes 1 through 5 bilaterally.  Plan: -Debrided onychomycotic nails 1 through 5 bilaterally.  Sharply debrided nails with nail clipper and reduced with a power bur.  Return 3 months [facility]

## 2023-11-26 ENCOUNTER — Other Ambulatory Visit: Payer: Self-pay

## 2023-11-27 DIAGNOSIS — M170 Bilateral primary osteoarthritis of knee: Secondary | ICD-10-CM | POA: Insufficient documentation

## 2023-11-27 DIAGNOSIS — M25561 Pain in right knee: Secondary | ICD-10-CM | POA: Insufficient documentation

## 2023-11-27 DIAGNOSIS — M00869 Arthritis due to other bacteria, unspecified knee: Secondary | ICD-10-CM | POA: Insufficient documentation

## 2023-12-04 ENCOUNTER — Encounter: Payer: Self-pay | Admitting: Internal Medicine

## 2023-12-09 ENCOUNTER — Encounter (HOSPITAL_COMMUNITY): Payer: Self-pay

## 2023-12-09 ENCOUNTER — Emergency Department (HOSPITAL_COMMUNITY)

## 2023-12-09 ENCOUNTER — Emergency Department (HOSPITAL_COMMUNITY)
Admission: EM | Admit: 2023-12-09 | Discharge: 2023-12-09 | Disposition: A | Discharge status: Home / Self Care | Attending: Emergency Medicine | Admitting: Emergency Medicine

## 2023-12-09 ENCOUNTER — Other Ambulatory Visit: Payer: Self-pay

## 2023-12-09 DIAGNOSIS — I7409 Other arterial embolism and thrombosis of abdominal aorta: Secondary | ICD-10-CM | POA: Diagnosis not present

## 2023-12-09 DIAGNOSIS — I129 Hypertensive chronic kidney disease with stage 1 through stage 4 chronic kidney disease, or unspecified chronic kidney disease: Secondary | ICD-10-CM | POA: Diagnosis not present

## 2023-12-09 DIAGNOSIS — R112 Nausea with vomiting, unspecified: Secondary | ICD-10-CM | POA: Insufficient documentation

## 2023-12-09 DIAGNOSIS — E1122 Type 2 diabetes mellitus with diabetic chronic kidney disease: Secondary | ICD-10-CM | POA: Insufficient documentation

## 2023-12-09 DIAGNOSIS — Z87891 Personal history of nicotine dependence: Secondary | ICD-10-CM | POA: Insufficient documentation

## 2023-12-09 DIAGNOSIS — Z8673 Personal history of transient ischemic attack (TIA), and cerebral infarction without residual deficits: Secondary | ICD-10-CM | POA: Diagnosis not present

## 2023-12-09 DIAGNOSIS — Z7982 Long term (current) use of aspirin: Secondary | ICD-10-CM | POA: Insufficient documentation

## 2023-12-09 DIAGNOSIS — N1831 Chronic kidney disease, stage 3a: Secondary | ICD-10-CM | POA: Insufficient documentation

## 2023-12-09 DIAGNOSIS — Z79899 Other long term (current) drug therapy: Secondary | ICD-10-CM | POA: Diagnosis not present

## 2023-12-09 LAB — URINALYSIS, ROUTINE W REFLEX MICROSCOPIC
Bilirubin Urine: NEGATIVE
Glucose, UA: >=500 mg/dL — AB
Hgb urine dipstick: NEGATIVE
Ketones, ur: 5 mg/dL — AB
Nitrite: NEGATIVE
Protein, ur: 30 mg/dL — AB
Specific Gravity, Urine: 1.03 (ref 1.005–1.030)
pH: 5 (ref 5.0–8.0)

## 2023-12-09 LAB — CBC
HCT: 42.8 % (ref 36.0–46.0)
Hemoglobin: 14.1 g/dL (ref 12.0–15.0)
MCH: 28.8 pg (ref 26.0–34.0)
MCHC: 32.9 g/dL (ref 30.0–36.0)
MCV: 87.3 fL (ref 80.0–100.0)
Platelets: 400 K/uL (ref 150–400)
RBC: 4.9 MIL/uL (ref 3.87–5.11)
RDW: 14.6 % (ref 11.5–15.5)
WBC: 17.1 K/uL — ABNORMAL HIGH (ref 4.0–10.5)
nRBC: 0 % (ref 0.0–0.2)

## 2023-12-09 LAB — LIPASE, BLOOD: Lipase: 35 U/L (ref 11–51)

## 2023-12-09 LAB — COMPREHENSIVE METABOLIC PANEL WITH GFR
ALT: 14 U/L (ref 0–44)
AST: 17 U/L (ref 15–41)
Albumin: 3.2 g/dL — ABNORMAL LOW (ref 3.5–5.0)
Alkaline Phosphatase: 52 U/L (ref 38–126)
Anion gap: 15 (ref 5–15)
BUN: 10 mg/dL (ref 8–23)
CO2: 18 mmol/L — ABNORMAL LOW (ref 22–32)
Calcium: 9.2 mg/dL (ref 8.9–10.3)
Chloride: 102 mmol/L (ref 98–111)
Creatinine, Ser: 0.96 mg/dL (ref 0.44–1.00)
GFR, Estimated: >60 mL/min (ref >60)
Glucose, Bld: 106 mg/dL — ABNORMAL HIGH (ref 70–99)
Potassium: 3.7 mmol/L (ref 3.5–5.1)
Sodium: 135 mmol/L (ref 135–145)
Total Bilirubin: 0.3 mg/dL (ref 0.0–1.2)
Total Protein: 7.1 g/dL (ref 6.5–8.1)

## 2023-12-09 MED ORDER — CEPHALEXIN 500 MG PO CAPS: 500.0000 mg | ORAL_CAPSULE | Freq: Four times a day (QID) | ORAL | 0 refills | Status: AC

## 2023-12-09 MED ORDER — IOHEXOL 350 MG/ML SOLN
75.0000 mL | Freq: Once | INTRAVENOUS | Status: AC | PRN
  Administered 2023-12-09: 75 mL via INTRAVENOUS

## 2023-12-09 MED ORDER — ONDANSETRON 4 MG PO TBDP
4.0000 mg | ORAL_TABLET | Freq: Once | ORAL | Status: AC | PRN
  Administered 2023-12-09: 4 mg via ORAL
  Filled 2023-12-09: qty 1

## 2023-12-09 MED ORDER — METOCLOPRAMIDE HCL 10 MG PO TABS: 10.0000 mg | ORAL_TABLET | Freq: Three times a day (TID) | ORAL | 0 refills | Status: AC | PRN

## 2023-12-09 MED ORDER — SODIUM CHLORIDE 0.9 % IV BOLUS
1000.0000 mL | Freq: Once | INTRAVENOUS | Status: AC
  Administered 2023-12-09: 1000 mL via INTRAVENOUS

## 2023-12-09 NOTE — Discharge Instructions (Addendum)
 We evaluated you for your nausea and vomiting.  Your testing in the emergency department is  overall reassuring.  We did not identify any dangerous cause of your symptoms in the emergency department.  Your symptoms may be related to Ozempic .  Your testing did show that you may have a UTI and we have prescribed you an antibiotic.  Your CT scan did show some arterial disease in the blood vessels in your abdomen and legs.  We discussed this with Dr. Magda, your vascular surgeon.  He does not think that this would be related to your symptoms but he would want to follow-up with you in clinic for further testing.  He has reached out to his scheduler, but please call his office tomorrow to help schedule an appointment.  If you have any new or worsening symptoms such as abdominal pain, lightheadedness or dizziness, severe pain, uncontrolled vomiting, pain in your legs or trouble walking, please return to the emergency department.

## 2023-12-09 NOTE — ED Triage Notes (Signed)
 Pt presents for NV no appetite since Monday.  Pt had a fall on Monday but does not endorse any injuries.

## 2023-12-09 NOTE — ED Notes (Signed)
 Unable to provide urine sample at this time

## 2023-12-09 NOTE — ED Notes (Signed)
2 failed IV attempts. ?

## 2023-12-09 NOTE — ED Provider Notes (Signed)
 [place] EMERGENCY DEPARTMENT AT [facility] Provider Note  CSN: [id number] Arrival date & time: 12/09/23 1442  Chief Complaint(s) No chief complaint on file.  HPI Ashley Price is a 68 y.o. female history of diabetes, hyperlipidemia, hypertension presenting to the emergency department with nausea and vomiting.  Patient reports nausea and vomiting this week.  Reports decreased appetite also.  Has not noticed any abdominal pain.  Feels dehydrated.  Took Ozempic  on Saturday, patient and daughter endorse patient has intermittent compliance and may not have taken it for the past few weeks.  No back pain.  No chest pain or shortness of breath.  No fevers or chills.  No cough, runny nose or sore throat.   Past Medical History Past Medical History:  Diagnosis Date   Hypertension    Osteoarthritis    Stroke [facility])    Patient Active Problem List   Diagnosis Date Noted   Need for prophylactic vaccination and inoculation against varicella 07/12/2023   Insulin -requiring or dependent type II diabetes mellitus (HCC) 11/23/2022   Trichomonal urethritis 11/23/2022   Tobacco abuse 11/21/2022   Abnormal electrocardiogram (ECG) (EKG) 11/21/2022   Encounter for general adult medical examination with abnormal findings 05/08/2022   Dyslipidemia, goal LDL below 70 05/03/2022   Atherosclerosis of aorta 05/03/2022   Stage 3a chronic kidney disease (HCC) 05/03/2022   PAD (peripheral artery disease) 05/03/2022   Type 2 diabetes mellitus with diabetic peripheral angiopathy without gangrene, without long-term current use of insulin  (HCC) 11/08/2021   Continuous tobacco abuse 11/08/2021   Malnutrition of moderate degree (Gomez: 60% to less than 75% of standard weight) 09/07/2021   Osteopenia of left femoral neck 09/07/2021   Peripheral vascular disease 07/15/2020   History of colonic polyps 07/15/2020   Essential hypertension 12/20/2018   Osteoarthritis 08/17/2018   Home Medication(s) Prior to  Admission medications   Medication Sig Start Date End Date Taking? Authorizing Provider  cephALEXin  (KEFLEX ) 500 MG capsule Take 1 capsule (500 mg total) by mouth 4 (four) times daily. 12/09/23  Yes Francesca Elsie CROME, MD  metoCLOPramide  (REGLAN ) 10 MG tablet Take 1 tablet (10 mg total) by mouth every 8 (eight) hours as needed for nausea or vomiting. 12/09/23  Yes Francesca Elsie CROME, MD  amLODipine  (NORVASC ) 5 MG tablet TAKE 1 TABLET (5 MG TOTAL) BY MOUTH DAILY. 10/02/23   Joshua Debby CROME, MD  aspirin  EC 81 MG tablet Take 1 tablet (81 mg total) by mouth daily. Swallow whole. 07/12/23   Joshua Debby CROME, MD  atorvastatin  (LIPITOR) 40 MG tablet Take 1 tablet (40 mg total) by mouth daily. 07/12/23   Joshua Debby CROME, MD  Cyanocobalamin  (B-12 PO) Take 1 tablet by mouth daily.    [provider]  dapagliflozin  propanediol (FARXIGA ) 10 MG TABS tablet Take 1 tablet (10 mg total) by mouth daily before breakfast. 07/13/23   Joshua Debby CROME, MD  losartan  (COZAAR ) 25 MG tablet Take 1 tablet (25 mg total) by mouth daily. 11/01/23   Joshua Debby CROME, MD  meclizine  (ANTIVERT ) 25 MG tablet Take 1 tablet (25 mg total) by mouth 3 (three) times daily. 06/25/23   Charlyn Sora, MD  Semaglutide ,0.25 or 0.5MG /DOS, (OZEMPIC , 0.25 OR 0.5 MG/DOSE,) 2 MG/3ML SOPN Inject 0.5 mg into the skin once a week. 10/22/23   Joshua Debby CROME, MD  spironolactone  (ALDACTONE ) 25 MG tablet TAKE 1 TABLET (25 MG TOTAL) BY MOUTH DAILY. 11/12/23   Joshua Debby CROME, MD  Past Surgical History Past Surgical History:  Procedure Laterality Date   AMPUTATION Left 08/18/2018   Procedure: AMPUTATION LEFT 2ND TOE;  Surgeon: Dozier Soulier, MD;  Location: [facility] OR;  Service: Orthopedics;  Laterality: Left;   PERCUTANEOUS VENOUS THROMBECTOMY,LYSIS WITH INTRAVASCULAR ULTRASOUND (IVUS) Left 07/29/2021   Procedure: PERCUTANEOUS  LEFT VENOUS THROMBECTOMY;  Surgeon: Magda Debby SAILOR, MD;  Location: [facility] OR;  Service: Vascular;  Laterality: Left;   TUBAL LIGATION Bilateral    Family History Family History  Problem Relation Age of Onset   Breast cancer Mother    Heart failure Brother    Kidney failure Brother    Alcoholism Brother     Social History Social History   Tobacco Use   Smoking status: Former    Current packs/day: 0.50    Average packs/day: 0.7 packs/day for 41.0 years (30.5 ttl pk-yrs)    Types: Cigarettes    Start date: 12/30/1978    Quit date: 07/01/2022    Passive exposure: Current (states she last smoked on 11/22/22 and she smoked 4/5 cigarettes per day)   Smokeless tobacco: Never  Vaping Use   Vaping status: Never Used  Substance Use Topics   Alcohol use: No   Drug use: No   Allergies Patient has no known allergies.  Review of Systems Review of Systems  All other systems reviewed and are negative.   Physical Exam Vital Signs  I have reviewed the triage vital signs BP (!) 98/59 (BP Location: Right Arm)   Pulse 62   Temp 98.1 F (36.7 C) (Oral)   Resp 16   Ht 5' 3 (1.6 m)   Wt 72.6 kg   SpO2 100%   BMI 28.34 kg/m  Physical Exam Vitals and nursing note reviewed.  Constitutional:      General: She is not in acute distress.    Appearance: She is well-developed.  HENT:     Head: Normocephalic and atraumatic.     Mouth/Throat:     Mouth: Mucous membranes are dry.  Eyes:     Pupils: Pupils are equal, round, and reactive to light.  Cardiovascular:     Rate and Rhythm: Normal rate and regular rhythm.     Heart sounds: No murmur heard. Pulmonary:     Effort: Pulmonary effort is normal. No respiratory distress.     Breath sounds: Normal breath sounds.  Abdominal:     General: Abdomen is flat.     Palpations: Abdomen is soft.     Tenderness: There is abdominal tenderness (RUQ).  Musculoskeletal:        General: No tenderness.     Right lower leg: No edema.     Left lower  leg: No edema.  Skin:    General: Skin is warm and dry.  Neurological:     General: No focal deficit present.     Mental Status: She is alert. Mental status is at baseline.  Psychiatric:        Mood and Affect: Mood normal.        Behavior: Behavior normal.     ED Results and Treatments Labs (all labs ordered are listed, but only abnormal results are displayed) Labs Reviewed  COMPREHENSIVE METABOLIC PANEL WITH GFR - Abnormal; Notable for the following components:      Result Value   CO2 18 (*)    Glucose, Bld 106 (*)    Albumin 3.2 (*)    All other components within normal limits  CBC - Abnormal; Notable for  the following components:   WBC 17.1 (*)    All other components within normal limits  URINALYSIS, ROUTINE W REFLEX MICROSCOPIC - Abnormal; Notable for the following components:   Color, Urine AMBER (*)    APPearance CLOUDY (*)    Glucose, UA >=500 (*)    Ketones, ur 5 (*)    Protein, ur 30 (*)    Leukocytes,Ua MODERATE (*)    Bacteria, UA MANY (*)    All other components within normal limits  LIPASE, BLOOD                                                                                                                          Radiology CT ABDOMEN PELVIS W CONTRAST Result Date: 12/09/2023 CLINICAL DATA:  Abdominal pain, acute, nonlocalized. EXAM: CT ABDOMEN AND PELVIS WITH CONTRAST TECHNIQUE: Multidetector CT imaging of the abdomen and pelvis was performed using the standard protocol following bolus administration of intravenous contrast. RADIATION DOSE REDUCTION: This exam was performed according to the departmental dose-optimization program which includes automated exposure control, adjustment of the mA and/or kV according to patient size and/or use of iterative reconstruction technique. CONTRAST:  75mL OMNIPAQUE IOHEXOL 350 MG/ML SOLN COMPARISON:  01/08/2023. FINDINGS: Lower chest: Mild atelectasis is present at the lung bases. Hepatobiliary: No focal liver abnormality is  seen. No gallstones, gallbladder wall thickening, or biliary dilatation. Pancreas: Unremarkable. No pancreatic ductal dilatation or surrounding inflammatory changes. Spleen: Normal in size without focal abnormality. Adrenals/Urinary Tract: There is a right adrenal nodule measuring 1.7 cm. There is a left adrenal nodule measuring 1.5 cm. Nodules were previously characterized as adenomas. The kidneys enhance symmetrically. Renal cortical scarring is noted bilaterally. No renal calculus or hydronephrosis bilaterally. Stomach/Bowel: The stomach is within normal limits. No bowel obstruction, free air, or pneumatosis. A normal appendix is seen in the right lower quadrant. Scattered diverticula are present along the colon without evidence of diverticulitis. Vascular/Lymphatic: Aortic atherosclerosis with mural thrombus and 75% stenosis involving the mid to distal abdominal aorta. The common iliac, external iliac and internal iliac arteries appear occluded with reconstitution of the distal external iliac artery on the left. There is severe stenosis of the visualized common and external iliac arteries on the right. No abdominopelvic lymphadenopathy. Reproductive: Uterus and bilateral adnexa are unremarkable. Other: No abdominopelvic ascites. Musculoskeletal: No acute osseous abnormality. Degenerative changes are present in the thoracolumbar spine and sacroiliac joints. IMPRESSION: 1. No acute intra-abdominal process. 2. Aortic atherosclerosis with extensive mural thrombus resulting in 25% stenosis of the mid to distal abdominal aorta. There is occlusion of the common, external, and internal iliac arteries on the left with distal reconstitution of the external iliac artery on the left, indeterminate in age. Severe stenosis of the iliac arteries on the right. CTA is recommended for follow-up. 3. Severe stenosis of the iliac arteries on the right. 4. Diverticulosis without diverticulitis. Electronically Signed   By: Leita Birmingham M.D.   On: 12/09/2023 18:45  Pertinent labs & imaging results that were available during my care of the patient were reviewed by me and considered in my medical decision making (see MDM for details).  Medications Ordered in ED Medications  ondansetron  (ZOFRAN -ODT) disintegrating tablet 4 mg (4 mg Oral Given 12/09/23 1458)  sodium chloride  0.9 % bolus 1,000 mL (0 mLs Intravenous Stopped 12/09/23 2002)  iohexol (OMNIPAQUE) 350 MG/ML injection 75 mL (75 mLs Intravenous Contrast Given 12/09/23 1811)                                                                                                                                     Procedures Procedures  (including critical care time)  Medical Decision Making / ED Course   MDM:  68 year old presenting to the emergency department with nausea and vomiting.  Patient overall well-appearing, physical examination with mild upper abdominal tenderness.  Differential includes gastroparesis, pancreatitis, medication side effect, cholecystitis, obstruction, perforation, appendicitis.  Given tenderness will obtain CT scan as well as laboratory testing.  Laboratory testing notable for mild leukocytosis however this appears to be somewhat chronic reviewing prior testing.  Will reassess.  Patient denies nausea currently and declines any nausea medicine.  Clinical Course as of 12/09/23 2032  [place] Dec 09, 2023  8090 CT scan shows intramural aortic thrombus with evidence of peripheral vascular disease, left iliac occlusion with reconstitution.  Patient has diminished pulses bilaterally but dopplerable PT in both extremities.  Think this is unlikely to be acute or the cause of the patient's symptoms but will discuss this with vascular surgery. [WS]  2001 Discussed with Dr. Magda.  Does not think that the imaging results would explain the patient's nausea and vomiting.  Patient not having any lower extremity symptoms so he thinks that patient should be  stable for outpatient follow-up with him in clinic.  He will reach out to his scheduler.  I think this is reasonable feel that these findings are more chronic.  CT scan otherwise without finding of dangerous abnormality. UA does show possible UTI. Will prescribe keflex . Will discharge patient to home. All questions answered. Patient comfortable with plan of discharge. Return precautions discussed with patient and specified on the after visit summary.  [WS]    Clinical Course User Index [WS] Francesca, Elsie CROME, MD     Additional history obtained: -Additional history obtained from family -External records from outside source obtained and reviewed including: Chart review including previous notes, labs, imaging, consultation notes including prior notes and imaging    Lab Tests: -I ordered, reviewed, and interpreted labs.   The pertinent results include:   Labs Reviewed  COMPREHENSIVE METABOLIC PANEL WITH GFR - Abnormal; Notable for the following components:      Result Value   CO2 18 (*)    Glucose, Bld 106 (*)    Albumin 3.2 (*)    All other components within normal limits  CBC - Abnormal; Notable for  the following components:   WBC 17.1 (*)    All other components within normal limits  URINALYSIS, ROUTINE W REFLEX MICROSCOPIC - Abnormal; Notable for the following components:   Color, Urine AMBER (*)    APPearance CLOUDY (*)    Glucose, UA >=500 (*)    Ketones, ur 5 (*)    Protein, ur 30 (*)    Leukocytes,Ua MODERATE (*)    Bacteria, UA MANY (*)    All other components within normal limits  LIPASE, BLOOD    Notable for possible UTI  EKG   EKG Interpretation Date/Time:  Sunday December 09 2023 15:14:45 EST Ventricular Rate:  96 PR Interval:  168 QRS Duration:  94 QT Interval:  358 QTC Calculation: 452 R Axis:   2  Text Interpretation: Normal sinus rhythm Low voltage QRS Possible Lateral infarct , age undetermined Abnormal ECG When compared with ECG of 25-Jun-2023 16:07,  No significant change since last tracing Confirmed by Francesca Fallow ([id number]) on 12/09/2023 4:19:21 PM         Imaging Studies ordered: I ordered imaging studies including CT abdomen On my interpretation imaging demonstrates chronic abnormal aortic finding  I independently visualized and interpreted imaging. I agree with the radiologist interpretation   Medicines ordered and prescription drug management: Meds ordered this encounter  Medications   ondansetron  (ZOFRAN -ODT) disintegrating tablet 4 mg   sodium chloride  0.9 % bolus 1,000 mL   iohexol (OMNIPAQUE) 350 MG/ML injection 75 mL   metoCLOPramide  (REGLAN ) 10 MG tablet    Sig: Take 1 tablet (10 mg total) by mouth every 8 (eight) hours as needed for nausea or vomiting.    Dispense:  30 tablet    Refill:  0   cephALEXin  (KEFLEX ) 500 MG capsule    Sig: Take 1 capsule (500 mg total) by mouth 4 (four) times daily.    Dispense:  28 capsule    Refill:  0    -I have reviewed the patients home medicines and have made adjustments as needed   Consultations Obtained: I requested consultation with the vascular surgeon,  and discussed lab and imaging findings as well as pertinent plan - they recommend: outpatient follow up     Reevaluation: After the interventions noted above, I reevaluated the patient and found that their symptoms have improved  Co morbidities that complicate the patient evaluation  Past Medical History:  Diagnosis Date   Hypertension    Osteoarthritis    Stroke [facility])       Dispostion: Disposition decision including need for hospitalization was considered, and patient discharged from emergency department.    Final Clinical Impression(s) / ED Diagnoses Final diagnoses:  Nausea and vomiting, unspecified vomiting type  Aortoiliac occlusive disease (HCC)     This chart was dictated using voice recognition software.  Despite best efforts to proofread,  errors can occur which can change the documentation  meaning.    Francesca Fallow CROME, MD 12/09/23 2032

## 2023-12-09 NOTE — ED Notes (Signed)
 Patient transported to CT

## 2023-12-10 ENCOUNTER — Other Ambulatory Visit: Payer: Self-pay

## 2023-12-10 ENCOUNTER — Telehealth: Payer: Self-pay | Admitting: Vascular Surgery

## 2023-12-10 DIAGNOSIS — I739 Peripheral vascular disease, unspecified: Secondary | ICD-10-CM

## 2023-12-10 NOTE — Telephone Encounter (Signed)
 Pt appt scheduled

## 2023-12-12 ENCOUNTER — Encounter: Payer: Self-pay | Admitting: Vascular Surgery

## 2023-12-12 ENCOUNTER — Ambulatory Visit (INDEPENDENT_AMBULATORY_CARE_PROVIDER_SITE_OTHER): Admitting: Vascular Surgery

## 2023-12-12 ENCOUNTER — Ambulatory Visit (HOSPITAL_COMMUNITY)
Admission: RE | Admit: 2023-12-12 | Discharge: 2023-12-12 | Disposition: A | Discharge status: Home / Self Care | Source: Ambulatory Visit | Attending: Surgery | Admitting: Surgery

## 2023-12-12 VITALS — BP 102/70 | HR 78 | Temp 98.2°F | Resp 20 | Ht 63.0 in | Wt 160.0 lb

## 2023-12-12 DIAGNOSIS — I739 Peripheral vascular disease, unspecified: Secondary | ICD-10-CM | POA: Insufficient documentation

## 2023-12-12 DIAGNOSIS — I7409 Other arterial embolism and thrombosis of abdominal aorta: Secondary | ICD-10-CM | POA: Diagnosis present

## 2023-12-12 LAB — VAS US ABI WITH/WO TBI
Left ABI: 0.72
Right ABI: 0.68

## 2023-12-12 NOTE — Progress Notes (Signed)
 [facility] OF [place]  ASSESSMENT / PLAN: 68 y.o. female with aortoiliac occlusive disease which is asymptomatic.  Recommend:  Abstinence from all tobacco products. Blood glucose control with goal A1c < 7%. Blood pressure control with goal blood pressure < 130/80 mmHg. Lipid reduction therapy with goal LDL-C < 55 mg/dL. Aspirin  81mg  by mouth daily. Atorvastatin  40-80mg  PO QD (or other high intensity statin therapy).  Will plan to monitor her arterial disease with yearly ankle-brachial necks.  CHIEF COMPLAINT: Aortoiliac occlusive disease on CT scan done for abdominal pain  HISTORY OF PRESENT ILLNESS: Ashley Price is a 68 y.o. female who returns to clinic for evaluation after CT scan done in the emergency department over the weekend for abdominal pain revealed aortoiliac occlusive disease.  The patient is thankfully asymptomatic from a peripheral arterial disease standpoint.  She reports no intermittent claudication, ischemic rest pain, or ischemic ulceration symptoms.  She is feeling much better from abdominal pain that brought her to the emergency department.  We reviewed her scan in detail.  We reviewed her noninvasive testing in detail.  Past Medical History:  Diagnosis Date   Hypertension    Osteoarthritis    Peripheral vascular disease    Stroke [facility])     Past Surgical History:  Procedure Laterality Date   AMPUTATION Left 08/18/2018   Procedure: AMPUTATION LEFT 2ND TOE;  Surgeon: Dozier Soulier, MD;  Location: [facility] OR;  Service: Orthopedics;  Laterality: Left;   PERCUTANEOUS VENOUS THROMBECTOMY,LYSIS WITH INTRAVASCULAR ULTRASOUND (IVUS) Left 07/29/2021   Procedure: PERCUTANEOUS LEFT VENOUS THROMBECTOMY;  Surgeon: Magda Debby SAILOR, MD;  Location: [facility] OR;  Service: Vascular;  Laterality: Left;   TUBAL LIGATION Bilateral     Family History  Problem Relation Age of Onset   Breast cancer Mother    Heart failure Brother    Kidney failure Brother     Alcoholism Brother     Social History   Socioeconomic History   Marital status: Single    Spouse name: Not on file   Number of children: Not on file   Years of education: Not on file   Highest education level: Some college, no degree  Occupational History   Not on file  Tobacco Use   Smoking status: Former    Current packs/day: 0.50    Average packs/day: 0.7 packs/day for 41.1 years (30.5 ttl pk-yrs)    Types: Cigarettes    Start date: 12/30/1978    Quit date: 07/01/2022    Passive exposure: Current (states she last smoked on 11/22/22 and she smoked 4/5 cigarettes per day)   Smokeless tobacco: Never  Vaping Use   Vaping status: Never Used  Substance and Sexual Activity   Alcohol use: No   Drug use: No   Sexual activity: Not on file  Other Topics Concern   Not on file  Social History Narrative   Lives with her children   Social Drivers of Health   Financial Resource Strain: Low Risk  (07/12/2023)   Overall Financial Resource Strain (CARDIA)    Difficulty of Paying Living Expenses: Not hard at all  Food Insecurity: No Food Insecurity (07/12/2023)   Hunger Vital Sign    Worried About Running Out of Food in the Last Year: Never true    Ran Out of Food in the Last Year: Never true  Transportation Needs: No Transportation Needs (07/12/2023)   PRAPARE - Administrator, Civil Service (Medical): No    Lack of Transportation (Non-Medical):  No  Physical Activity: Inactive (07/12/2023)   Exercise Vital Sign    Days of Exercise per Week: 0 days    Minutes of Exercise per Session: Not on file  Stress: No Stress Concern Present (07/12/2023)   [employer] of Occupational Health - Occupational Stress Questionnaire    Feeling of Stress: Not at all  Social Connections: Moderately Integrated (07/12/2023)   Social Connection and Isolation Panel    Frequency of Communication with Friends and Family: More than three times a week    Frequency of Social Gatherings with Friends  and Family: Three times a week    Attends Religious Services: More than 4 times per year    Active Member of Clubs or Organizations: Yes    Attends Banker Meetings: More than 4 times per year    Marital Status: Never married  Intimate Partner Violence: Not At Risk (03/01/2023)   Humiliation, Afraid, Rape, and Kick questionnaire    Fear of Current or Ex-Partner: No    Emotionally Abused: No    Physically Abused: No    Sexually Abused: No    No Known Allergies  Current Outpatient Medications  Medication Sig Dispense Refill   amLODipine  (NORVASC ) 5 MG tablet TAKE 1 TABLET (5 MG TOTAL) BY MOUTH DAILY. 90 tablet 0   aspirin  EC 81 MG tablet Take 1 tablet (81 mg total) by mouth daily. Swallow whole. 90 tablet 1   atorvastatin  (LIPITOR) 40 MG tablet Take 1 tablet (40 mg total) by mouth daily. 90 tablet 1   cephALEXin  (KEFLEX ) 500 MG capsule Take 1 capsule (500 mg total) by mouth 4 (four) times daily. 28 capsule 0   Cyanocobalamin  (B-12 PO) Take 1 tablet by mouth daily.     dapagliflozin  propanediol (FARXIGA ) 10 MG TABS tablet Take 1 tablet (10 mg total) by mouth daily before breakfast. 90 tablet 1   losartan  (COZAAR ) 25 MG tablet Take 1 tablet (25 mg total) by mouth daily. 90 tablet 1   meclizine  (ANTIVERT ) 25 MG tablet Take 1 tablet (25 mg total) by mouth 3 (three) times daily. 30 tablet 0   metoCLOPramide  (REGLAN ) 10 MG tablet Take 1 tablet (10 mg total) by mouth every 8 (eight) hours as needed for nausea or vomiting. 30 tablet 0   Semaglutide ,0.25 or 0.5MG /DOS, (OZEMPIC , 0.25 OR 0.5 MG/DOSE,) 2 MG/3ML SOPN Inject 0.5 mg into the skin once a week. 9 mL 0   spironolactone  (ALDACTONE ) 25 MG tablet TAKE 1 TABLET (25 MG TOTAL) BY MOUTH DAILY. 90 tablet 0   No current facility-administered medications for this visit.    PHYSICAL EXAM Vitals:   12/12/23 1428  BP: 102/70  Pulse: 78  Resp: 20  Temp: 98.2 F (36.8 C)  TempSrc: Temporal  SpO2: 99%  Weight: 160 lb (72.6 kg)   Height: 5' 3 (1.6 m)   Elderly woman in no distress Regular rate and rhythm Unlabored breathing No palpable pedal pulses   PERTINENT LABORATORY AND RADIOLOGIC DATA  Most recent CBC    Latest Ref Rng & Units 12/09/2023    2:54 PM 06/25/2023    4:04 PM 03/14/2023    3:28 PM  CBC  WBC 4.0 - 10.5 K/uL 17.1  14.0  13.8   Hemoglobin 12.0 - 15.0 g/dL 85.8  86.6  85.7   Hematocrit 36.0 - 46.0 % 42.8  40.7  43.6   Platelets 150 - 400 K/uL 400  359  280.0      Most recent CMP  Latest Ref Rng & Units 12/09/2023    2:54 PM 06/25/2023    4:04 PM 03/14/2023    3:28 PM  CMP  Glucose 70 - 99 mg/dL 893  852  886   BUN 8 - 23 mg/dL 10  9  12    Creatinine 0.44 - 1.00 mg/dL 9.03  8.90  9.02   Sodium 135 - 145 mmol/L 135  138  134   Potassium 3.5 - 5.1 mmol/L 3.7  4.1  3.7   Chloride 98 - 111 mmol/L 102  105  103   CO2 22 - 32 mmol/L 18  23  22    Calcium  8.9 - 10.3 mg/dL 9.2  9.6  9.6   Total Protein 6.5 - 8.1 g/dL 7.1  7.2  7.8   Total Bilirubin 0.0 - 1.2 mg/dL 0.3  0.9  0.5   Alkaline Phos 38 - 126 U/L 52  68  70   AST 15 - 41 U/L 17  21  27    ALT 0 - 44 U/L 14  17  15      Renal function Estimated Creatinine Clearance: 52.8 mL/min (by C-G formula based on SCr of 0.96 mg/dL).  Hgb A1c MFr Bld (%)  Date Value  07/12/2023 7.0 (H)    LDL Chol Calc (NIH)  Date Value Ref Range Status  02/11/2019 29 0 - 99 mg/dL Final   LDL Cholesterol  Date Value Ref Range Status  03/14/2023 30 0 - 99 mg/dL Final    CT scan of the abdomen pelvis performed over the weekend shows aortoiliac occlusive disease with near occlusion of the infrarenal aorta.  The study is not timed well for arterial evaluation.   LOWER EXTREMITY DOPPLER STUDY   Patient Name:  Ashley Price  Date of Exam:   12/12/2023  Medical Rec #: [id number]       Accession #:    [id number]  Date of Birth: 09-10-1955        Patient Gender: F  Patient Age:   11 years  Exam Location:  [address]  Procedure:      VAS US  ABI  WITH/WO TBI  Referring Phys: DEBBY ROBERTSON    ---------------------------------------------------------------------------  -----    Indications: Peripheral artery disease.   High Risk Factors: Hypertension, Diabetes, past history of smoking.     Comparison Study: 09/05/2022   Performing Technologist: Stoney Ross RVT   Supporting Technologist: Mardy Dolly (student)     Examination Guidelines: A complete evaluation includes at minimum, Doppler  waveform signals and systolic blood pressure reading at the level of  bilateral  brachial, anterior tibial, and posterior tibial arteries, when vessel  segments  are accessible. Bilateral testing is considered an integral part of a  complete  examination. Photoelectric Plethysmograph (PPG) waveforms and toe systolic  pressure readings are included as required and additional duplex testing  as  needed. Limited examinations for reoccurring indications may be performed  as  noted.     ABI Findings:  +---------+------------------+-----+-------------------+--------+  Right   Rt Pressure (mmHg)IndexWaveform           Comment   +---------+------------------+-----+-------------------+--------+  Brachial 109                                                 +---------+------------------+-----+-------------------+--------+  ATA     74  0.68 monophasic                   +---------+------------------+-----+-------------------+--------+  PTA     71                0.65 dampened monophasic          +---------+------------------+-----+-------------------+--------+  Great Toe58                0.53                              +---------+------------------+-----+-------------------+--------+   +---------+------------------+-----+----------+-------+  Left    Lt Pressure (mmHg)IndexWaveform  Comment  +---------+------------------+-----+----------+-------+  Brachial 103                                        +---------+------------------+-----+----------+-------+  ATA     79                0.72 monophasic         +---------+------------------+-----+----------+-------+  PTA     63                0.58 monophasic         +---------+------------------+-----+----------+-------+  Great Toe55                0.50                    +---------+------------------+-----+----------+-------+   +-------+-----------+-----------+------------+------------+  ABI/TBIToday's ABIToday's TBIPrevious ABIPrevious TBI  +-------+-----------+-----------+------------+------------+  Right 0.68       0.53       0.51        0.22          +-------+-----------+-----------+------------+------------+  Left  0.72       0.50       0.47        0.38          +-------+-----------+-----------+------------+------------+      Arterial wall calcification precludes accurate ankle pressures and ABIs.  Bilateral ABIs and TBIs appear increased compared to prior study on  09/05/2022.    Summary:  Right: Resting right ankle-brachial index indicates moderate right lower  extremity arterial disease. The right toe-brachial index is abnormal.    Left: Resting left ankle-brachial index indicates moderate left lower  extremity arterial disease. The left toe-brachial index is abnormal.     *See table(s) above for measurements and observations.       Electronically signed by Penne Colorado MD on 12/12/2023 at 3:32:16 PM.   Debby SAILOR. Magda, MD [facility] Special[facility] of [facility] Phone Number: [phone] 12/12/2023 4:27 PM   Total time spent on preparing this encounter including chart review, data review, collecting history, examining the patient, and coordinating care: 30 min  Portions of this report may have been transcribed using voice recognition software.  Every effort has been made to ensure accuracy; however, inadvertent computerized transcription errors  may still be present.

## 2023-12-26 ENCOUNTER — Other Ambulatory Visit: Payer: Self-pay | Admitting: Internal Medicine

## 2023-12-26 DIAGNOSIS — I5189 Other ill-defined heart diseases: Secondary | ICD-10-CM

## 2023-12-26 DIAGNOSIS — I10 Essential (primary) hypertension: Secondary | ICD-10-CM

## 2024-01-09 ENCOUNTER — Other Ambulatory Visit

## 2024-01-09 ENCOUNTER — Other Ambulatory Visit: Payer: Self-pay | Admitting: Internal Medicine

## 2024-01-09 DIAGNOSIS — E1151 Type 2 diabetes mellitus with diabetic peripheral angiopathy without gangrene: Secondary | ICD-10-CM

## 2024-01-10 ENCOUNTER — Ambulatory Visit
Admission: RE | Admit: 2024-01-10 | Discharge: 2024-01-10 | Disposition: A | Discharge status: Home / Self Care | Source: Ambulatory Visit | Attending: Acute Care | Admitting: Acute Care

## 2024-01-10 DIAGNOSIS — Z122 Encounter for screening for malignant neoplasm of respiratory organs: Secondary | ICD-10-CM

## 2024-01-10 DIAGNOSIS — Z87891 Personal history of nicotine dependence: Secondary | ICD-10-CM

## 2024-01-15 ENCOUNTER — Other Ambulatory Visit: Payer: Self-pay

## 2024-01-15 ENCOUNTER — Ambulatory Visit: Admitting: Adult Health

## 2024-01-15 DIAGNOSIS — Z122 Encounter for screening for malignant neoplasm of respiratory organs: Secondary | ICD-10-CM

## 2024-01-15 DIAGNOSIS — Z87891 Personal history of nicotine dependence: Secondary | ICD-10-CM

## 2024-01-16 ENCOUNTER — Other Ambulatory Visit: Payer: Self-pay | Admitting: Internal Medicine

## 2024-01-16 DIAGNOSIS — E1151 Type 2 diabetes mellitus with diabetic peripheral angiopathy without gangrene: Secondary | ICD-10-CM

## 2024-01-16 DIAGNOSIS — N1831 Chronic kidney disease, stage 3a: Secondary | ICD-10-CM

## 2024-01-16 NOTE — Telephone Encounter (Signed)
LVMTCB to schedule overdue f/u.

## 2024-02-04 DIAGNOSIS — E1151 Type 2 diabetes mellitus with diabetic peripheral angiopathy without gangrene: Secondary | ICD-10-CM

## 2024-02-08 ENCOUNTER — Other Ambulatory Visit: Payer: Self-pay | Admitting: Internal Medicine

## 2024-02-08 DIAGNOSIS — I10 Essential (primary) hypertension: Secondary | ICD-10-CM

## 2024-02-20 ENCOUNTER — Ambulatory Visit: Admitting: Internal Medicine

## 2024-02-20 ENCOUNTER — Encounter: Payer: Self-pay | Admitting: Internal Medicine

## 2024-02-20 VITALS — BP 116/68 | HR 87 | Temp 98.2°F | Resp 16 | Ht 63.0 in | Wt 156.6 lb

## 2024-02-20 DIAGNOSIS — I10 Essential (primary) hypertension: Secondary | ICD-10-CM

## 2024-02-20 DIAGNOSIS — E785 Hyperlipidemia, unspecified: Secondary | ICD-10-CM | POA: Diagnosis not present

## 2024-02-20 DIAGNOSIS — Z794 Long term (current) use of insulin: Secondary | ICD-10-CM

## 2024-02-20 DIAGNOSIS — Z1231 Encounter for screening mammogram for malignant neoplasm of breast: Secondary | ICD-10-CM | POA: Insufficient documentation

## 2024-02-20 DIAGNOSIS — N1831 Chronic kidney disease, stage 3a: Secondary | ICD-10-CM | POA: Diagnosis not present

## 2024-02-20 DIAGNOSIS — E1151 Type 2 diabetes mellitus with diabetic peripheral angiopathy without gangrene: Secondary | ICD-10-CM | POA: Diagnosis not present

## 2024-02-20 DIAGNOSIS — E119 Type 2 diabetes mellitus without complications: Secondary | ICD-10-CM

## 2024-02-20 DIAGNOSIS — E1122 Type 2 diabetes mellitus with diabetic chronic kidney disease: Secondary | ICD-10-CM

## 2024-02-20 DIAGNOSIS — I739 Peripheral vascular disease, unspecified: Secondary | ICD-10-CM

## 2024-02-20 LAB — CBC WITH DIFFERENTIAL/PLATELET
Basophils Absolute: 0.1 K/uL (ref 0.0–0.1)
Basophils Relative: 0.9 % (ref 0.0–3.0)
Eosinophils Absolute: 0.3 K/uL (ref 0.0–0.7)
Eosinophils Relative: 2.4 % (ref 0.0–5.0)
HCT: 41.5 % (ref 36.0–46.0)
Hemoglobin: 13.8 g/dL (ref 12.0–15.0)
Lymphocytes Relative: 24 % (ref 12.0–46.0)
Lymphs Abs: 2.8 K/uL (ref 0.7–4.0)
MCHC: 33.2 g/dL (ref 30.0–36.0)
MCV: 86.8 fl (ref 78.0–100.0)
Monocytes Absolute: 0.8 K/uL (ref 0.1–1.0)
Monocytes Relative: 7.3 % (ref 3.0–12.0)
Neutro Abs: 7.6 K/uL (ref 1.4–7.7)
Neutrophils Relative %: 65.4 % (ref 43.0–77.0)
Platelets: 346 K/uL (ref 150.0–400.0)
RBC: 4.77 Mil/uL (ref 3.87–5.11)
RDW: 15.4 % (ref 11.5–15.5)
WBC: 11.6 K/uL — ABNORMAL HIGH (ref 4.0–10.5)

## 2024-02-20 LAB — LIPID PANEL
Cholesterol: 87 mg/dL (ref 28–200)
HDL: 38.6 mg/dL — ABNORMAL LOW
LDL Cholesterol: 33 mg/dL (ref 10–99)
NonHDL: 48.09
Total CHOL/HDL Ratio: 2
Triglycerides: 73 mg/dL (ref 10.0–149.0)
VLDL: 14.6 mg/dL (ref 0.0–40.0)

## 2024-02-20 LAB — HEPATIC FUNCTION PANEL
ALT: 12 U/L (ref 3–35)
AST: 24 U/L (ref 5–37)
Albumin: 4.4 g/dL (ref 3.5–5.2)
Alkaline Phosphatase: 82 U/L (ref 39–117)
Bilirubin, Direct: 0.1 mg/dL (ref 0.1–0.3)
Total Bilirubin: 0.5 mg/dL (ref 0.2–1.2)
Total Protein: 8.2 g/dL (ref 6.0–8.3)

## 2024-02-20 LAB — BASIC METABOLIC PANEL WITH GFR
BUN: 11 mg/dL (ref 6–23)
CO2: 20 meq/L (ref 19–32)
Calcium: 10 mg/dL (ref 8.4–10.5)
Chloride: 104 meq/L (ref 96–112)
Creatinine, Ser: 1 mg/dL (ref 0.40–1.20)
GFR: 57.34 mL/min — ABNORMAL LOW
Glucose, Bld: 113 mg/dL — ABNORMAL HIGH (ref 70–99)
Potassium: 4.3 meq/L (ref 3.5–5.1)
Sodium: 135 meq/L (ref 135–145)

## 2024-02-20 LAB — HEMOGLOBIN A1C: Hgb A1c MFr Bld: 7.7 % — ABNORMAL HIGH (ref 4.6–6.5)

## 2024-02-20 LAB — TSH: TSH: 1.87 u[IU]/mL (ref 0.35–5.50)

## 2024-02-20 NOTE — Patient Instructions (Signed)

## 2024-02-20 NOTE — Progress Notes (Unsigned)
 "  Subjective:  Patient ID: Ashley Price, female    DOB: [birth year]  Age: 69 y.o. MRN: [id number]  CC: Diabetes   HPI Ashley Price presents for f/up ---  Discussed the use of AI scribe software for clinical note transcription with the patient, who gave verbal consent to proceed.  History of Present Illness Ashley Price is a 69 year old female with aortic arteriosclerosis who presents for follow-up.  She has a history of aortic arteriosclerosis with neural thrombosis and 75% stenosis involving the mid to distal abdominal aorta. An ultrasound following a recent urinary tract infection revealed these findings, but no abdominal pelvic lymphadenopathy was noted. She is currently asymptomatic with no abdominal pain, nausea, vomiting, diarrhea, or constipation.  She had a urinary tract infection in November, which was treated with antibiotics, leading to symptom resolution. She currently has no urinary issues and feels fine.  She is on 0.25 mg of Ozempic  for type 2 diabetes mellitus management, as she did not tolerate the 0.5 mg dose due to decreased appetite. No weakness, dizziness, lightheadedness, or trouble breathing. Her blood pressure was measured at 116/68.  No pain or swelling in her legs or feet and no issues with urine flow. She has a good appetite and regular bowel movements, with the last one being yesterday. She does not take any medication for constipation or diarrhea.     Outpatient Medications Prior to Visit  Medication Sig Dispense Refill   amLODipine  (NORVASC ) 5 MG tablet TAKE 1 TABLET (5 MG TOTAL) BY MOUTH DAILY. 90 tablet 0   aspirin  EC 81 MG tablet Take 1 tablet (81 mg total) by mouth daily. Swallow whole. 90 tablet 1   cephALEXin  (KEFLEX ) 500 MG capsule Take 1 capsule (500 mg total) by mouth 4 (four) times daily. 28 capsule 0   Cyanocobalamin  (B-12 PO) Take 1 tablet by mouth daily.     FARXIGA  10 MG TABS tablet TAKE 1 TABLET BY MOUTH DAILY BEFORE BREAKFAST. 90 tablet  1   losartan  (COZAAR ) 25 MG tablet Take 1 tablet (25 mg total) by mouth daily. 90 tablet 1   meclizine  (ANTIVERT ) 25 MG tablet Take 1 tablet (25 mg total) by mouth 3 (three) times daily. 30 tablet 0   metoCLOPramide  (REGLAN ) 10 MG tablet Take 1 tablet (10 mg total) by mouth every 8 (eight) hours as needed for nausea or vomiting. 30 tablet 0   spironolactone  (ALDACTONE ) 25 MG tablet TAKE 1 TABLET (25 MG TOTAL) BY MOUTH DAILY. 90 tablet 0   atorvastatin  (LIPITOR) 40 MG tablet Take 1 tablet (40 mg total) by mouth daily. 90 tablet 1   Semaglutide ,0.25 or 0.5MG /DOS, (OZEMPIC , 0.25 OR 0.5 MG/DOSE,) 2 MG/3ML SOPN INJECT 0.5 MG INTO THE SKIN ONE TIME PER WEEK 6 mL 0   No facility-administered medications prior to visit.    ROS Review of Systems  Constitutional:  Negative for appetite change, chills, diaphoresis, fatigue and fever.  HENT: Negative.  Negative for tinnitus.   Eyes: Negative.   Respiratory:  Negative for cough, chest tightness, shortness of breath and wheezing.   Cardiovascular:  Negative for chest pain, palpitations and leg swelling.  Gastrointestinal:  Positive for constipation. Negative for abdominal pain, blood in stool, diarrhea, nausea and vomiting.  Endocrine: Negative.   Genitourinary: Negative.  Negative for difficulty urinating, dysuria and hematuria.  Musculoskeletal: Negative.   Skin: Negative.   Neurological:  Negative for dizziness, weakness and light-headedness.  Hematological:  Negative for adenopathy. Does not bruise/bleed  easily.  Psychiatric/Behavioral: Negative.      Objective:  BP 116/68 (BP Location: Left Arm, Patient Position: Sitting, Cuff Size: Normal)   Pulse 87   Temp 98.2 F (36.8 C) (Oral)   Resp 16   Ht 5' 3 (1.6 m)   Wt 156 lb 9.6 oz (71 kg)   SpO2 99%   BMI 27.74 kg/m   BP Readings from Last 3 Encounters:  02/20/24 116/68  12/12/23 102/70  12/09/23 (!) 98/59    Wt Readings from Last 3 Encounters:  02/20/24 156 lb 9.6 oz (71 kg)   12/12/23 160 lb (72.6 kg)  12/09/23 160 lb (72.6 kg)    Physical Exam Vitals reviewed.  Constitutional:      Appearance: Normal appearance.  HENT:     Mouth/Throat:     Mouth: Mucous membranes are moist.  Eyes:     General: No scleral icterus.    Conjunctiva/sclera: Conjunctivae normal.  Cardiovascular:     Rate and Rhythm: Normal rate and regular rhythm.     Heart sounds: No murmur heard. Abdominal:     General: Abdomen is flat. Bowel sounds are decreased. There is no distension.     Palpations: Abdomen is soft. There is no hepatomegaly, splenomegaly or mass.     Tenderness: There is no abdominal tenderness.  Musculoskeletal:        General: Normal range of motion.     Right lower leg: No edema.     Left lower leg: No edema.  Skin:    General: Skin is warm and dry.     Coloration: Skin is not pale.     Findings: No rash.  Neurological:     General: No focal deficit present.     Mental Status: She is alert.  Psychiatric:        Attention and Perception: She is inattentive.        Mood and Affect: Mood normal.        Speech: She is communicative. Speech is delayed. Speech is not rapid and pressured or tangential.        Behavior: Behavior normal. Behavior is cooperative.        Thought Content: Thought content normal. Thought content is not paranoid.        Cognition and Memory: Cognition is impaired. Memory is impaired. She exhibits impaired recent memory and impaired remote memory.        Judgment: Judgment normal.     Lab Results  Component Value Date   WBC 11.6 (H) 02/20/2024   HGB 13.8 02/20/2024   HCT 41.5 02/20/2024   PLT 346.0 02/20/2024   GLUCOSE 113 (H) 02/20/2024   CHOL 87 02/20/2024   TRIG 73.0 02/20/2024   HDL 38.60 (L) 02/20/2024   LDLCALC 33 02/20/2024   ALT 12 02/20/2024   AST 24 02/20/2024   NA 135 02/20/2024   K 4.3 Hemolysis seen... 02/20/2024   CL 104 02/20/2024   CREATININE 1.00 02/20/2024   BUN 11 02/20/2024   CO2 20 02/20/2024    TSH 1.87 02/20/2024   INR 1.2 07/29/2021   HGBA1C 7.7 (H) 02/20/2024   MICROALBUR 1.5 07/12/2023    CT CHEST LUNG CA SCREEN LOW DOSE W/O CM Result Date: 01/11/2024 CLINICAL DATA:  69 year old female former smoker with 22 pack-year smoking history, quit smoking 2 years prior EXAM: CT CHEST WITHOUT CONTRAST LOW-DOSE FOR LUNG CANCER SCREENING TECHNIQUE: Multidetector CT imaging of the chest was performed following the standard protocol without IV contrast. RADIATION  DOSE REDUCTION: This exam was performed according to the departmental dose-optimization program which includes automated exposure control, adjustment of the mA and/or kV according to patient size and/or use of iterative reconstruction technique. COMPARISON:  01/08/2023 screening chest CT FINDINGS: Cardiovascular: Normal heart size. No significant pericardial effusion/thickening. Left anterior descending coronary atherosclerosis. Atherosclerotic nonaneurysmal thoracic aorta. Normal caliber pulmonary arteries. Mediastinum/Nodes: No significant thyroid  nodules. Unremarkable esophagus. No pathologically enlarged axillary, mediastinal or hilar lymph nodes, noting limited sensitivity for the detection of hilar adenopathy on this noncontrast study. Lungs/Pleura: No pneumothorax. No pleural effusion. Mild centrilobular emphysema with diffuse bronchial wall thickening. No acute consolidative airspace disease or lung masses. Stable tiny 3.3 mm peripheral right upper lobe nodule on image 140. No new significant pulmonary nodules. Upper abdomen: Cholelithiasis. Right adrenal 2.2 cm nodule with density -10 HU and left adrenal 2.2 cm nodule with density -2 HU, stable bilaterally and compatible with benign adrenal adenomas. Musculoskeletal: No aggressive appearing focal osseous lesions. Mild-to-moderate thoracic spondylosis. IMPRESSION: 1. Lung-RADS 2, benign appearance or behavior. Continue annual screening with low-dose chest CT without contrast in 12 months. 2.  One vessel coronary atherosclerosis. 3. Cholelithiasis. 4. Stable bilateral adrenal adenomas, for which no follow-up imaging is recommended. 5. Aortic Atherosclerosis (ICD10-I70.0) and Emphysema (ICD10-J43.9). Electronically Signed   By: Selinda DELENA Blue M.D.   On: 01/11/2024 20:39   Serum creatinine: 1 mg/dL 98/78/73 8554 Estimated creatinine clearance: 50.1 mL/min   Assessment & Plan:   Essential hypertension- BP is well controlled. -     CBC with Differential/Platelet; Future -     Basic metabolic panel with GFR; Future  Type 2 diabetes mellitus with diabetic peripheral angiopathy without gangrene, without long-term current use of insulin  (HCC) -     Basic metabolic panel with GFR; Future -     Hemoglobin A1c; Future -     Ozempic  (0.25 or 0.5 MG/DOSE); Inject 0.5 mg into the skin once a week.  Dispense: 9 mL; Refill: 1  Dyslipidemia, goal LDL below 70- LDL goal achieved. Doing well on the statin  -     Lipid panel; Future -     TSH; Future -     Hepatic function panel; Future  Screening mammogram for breast cancer -     3D Screening Mammogram, Left and Right; Future  Type 2 diabetes mellitus with stage 3a chronic kidney disease, without long-term current use of insulin  (HCC)- Will avoid nephrotoxic agents  -     Ozempic  (0.25 or 0.5 MG/DOSE); Inject 0.5 mg into the skin once a week.  Dispense: 9 mL; Refill: 1     Follow-up: Return in about 6 months (around 08/19/2024).  Debby Molt, MD "

## 2024-02-21 ENCOUNTER — Ambulatory Visit: Payer: Self-pay | Admitting: Internal Medicine

## 2024-02-21 ENCOUNTER — Telehealth: Payer: Self-pay | Admitting: Internal Medicine

## 2024-02-21 LAB — MICROALBUMIN / CREATININE URINE RATIO
Creatinine,U: 33.2 mg/dL
Microalb Creat Ratio: 55.3 mg/g — ABNORMAL HIGH (ref 0.0–30.0)
Microalb, Ur: 1.8 mg/dL (ref 0.7–1.9)

## 2024-02-21 MED ORDER — ATORVASTATIN CALCIUM 40 MG PO TABS: 40.0000 mg | ORAL_TABLET | Freq: Every day | ORAL | 1 refills | Status: AC

## 2024-02-21 MED ORDER — OZEMPIC (0.25 OR 0.5 MG/DOSE) 2 MG/3ML ~~LOC~~ SOPN: 0.5000 mg | PEN_INJECTOR | SUBCUTANEOUS | 1 refills | Status: AC

## 2024-02-21 NOTE — Progress Notes (Signed)
 Care Guide Pharmacy Note  02/21/2024 Name: Ashley Price MRN: [id number] DOB: 1955/12/15  Referred By: Joshua Debby CROME, MD Reason for referral: Call Attempt #1 and Complex Care Management (Outreach to sch ref w/ pharm)   Ashley Price is a 69 y.o. year old female who is a primary care patient of Joshua Debby CROME, MD.  Ashley Price was referred to the pharmacist for assistance related to: HLD and DMII  Successful contact was made with the patient to discuss pharmacy services including being ready for the pharmacist to call at least 5 minutes before the scheduled appointment time and to have medication bottles and any blood pressure readings ready for review. The patient agreed to meet with the pharmacist via telephone visit on 03/06/2024.  Ashley Price [facility] Guide Direct Dial: [phone]  Fax: [phone]

## 2024-02-27 ENCOUNTER — Ambulatory Visit: Admitting: Podiatry

## 2024-02-27 DIAGNOSIS — M79675 Pain in left toe(s): Secondary | ICD-10-CM

## 2024-02-27 DIAGNOSIS — M79674 Pain in right toe(s): Secondary | ICD-10-CM | POA: Diagnosis not present

## 2024-02-27 DIAGNOSIS — B351 Tinea unguium: Secondary | ICD-10-CM

## 2024-02-27 NOTE — Progress Notes (Signed)
 Patient presents for evaluation and treatment of tenderness and some redness around nails feet.  Tenderness around toes with walking and wearing shoes.  Physical exam:  General appearance: Alert, pleasant, and in no acute distress.  Vascular: Pedal pulses: DP 1/4 B/L, PT 0/4 B/L. Moderate edema lower legs bilaterally.  Capillary refill time immediate bilaterally  Neurologic:  Dermatologic:  Nails thickened, disfigured, discolored 1-5 BL, except second toe left, with subungual debris.  Redness and hypertrophic nail folds along nail folds bilaterally but no signs of drainage or infection.  Musculoskeletal:  Status post amputation second toe left   Diagnosis: 1. Painful onychomycotic nails 1 through 5 bilaterally, except the second toe left. 2. Pain toes 1 through 5 bilaterally.  Plan: -Debrided onychomycotic nails 1 through 5 bilaterally, except second toe left.  Sharply debrided nails with nail clipper and reduced with a power bur.  Return 3 months [facility]

## 2024-03-06 ENCOUNTER — Other Ambulatory Visit: Admitting: Pharmacist

## 2024-03-06 DIAGNOSIS — E1151 Type 2 diabetes mellitus with diabetic peripheral angiopathy without gangrene: Secondary | ICD-10-CM

## 2024-03-06 NOTE — Progress Notes (Signed)
" ° °  03/06/2024 Name: Ashley Price MRN: [id number] DOB: February 12, 1955  Chief Complaint  Patient presents with   Diabetes   Medication Management    TAKHIA SPOON is a 69 y.o. year old female who presented for a telephone visit. Spoke to patient and her daughter, Rosina.   They were referred to the pharmacist by their PCP for assistance in managing diabetes.    Subjective:  Care Team: Primary Care Provider: Joshua Debby CROME, MD ; Next Scheduled Visit: none scheduled   Medication Access/Adherence  Current Pharmacy:  [id number] - [place], [place] - [address] [place] [address] Phone: [phone] Fax: [phone]   Patient reports affordability concerns with their medications: No  Patient reports access/transportation concerns to their pharmacy: No  Patient reports adherence concerns with their medications:  No     Diabetes:  Current medications: Ozempic  0.25 mg weekly  (since around October), Farxiga  10 mg daily Medications tried in the past: Farxiga  - trichomonas infection, metformin  (N/V when taking with Ozempic )  Lionel states pt had gone back to taking Ozempic  0.25 mg due to decreased appetite/not eating on the 1 and 0.5 mg.   Using glucometer; testing 2 times daily  Current meal patterns:  - Notes she does not eat a lot in one sitting, tends to graze/snack - Notes she stopped drinking soda/juice after PCP appt last week/A1c result - Reports not great at drinking water, has increased - Drinks Glucerna shakes to help supplement meals  Current physical activity: limited due to arthritis in knees  Current medication access support: none, pt has Medicaid   Objective:  Lab Results  Component Value Date   HGBA1C 7.7 (H) 02/20/2024    Lab Results  Component Value Date   CREATININE 1.00 02/20/2024   BUN 11 02/20/2024   NA 135 02/20/2024   K 4.3 Hemolysis seen... 02/20/2024   CL 104 02/20/2024   CO2 20 02/20/2024    Lab  Results  Component Value Date   CHOL 87 02/20/2024   HDL 38.60 (L) 02/20/2024   LDLCALC 33 02/20/2024   TRIG 73.0 02/20/2024   CHOLHDL 2 02/20/2024    Medications Reviewed Today   Medications were not reviewed in this encounter       Assessment/Plan:   Diabetes: - Currently uncontrolled, A1c goal <7%.  - Trial Ozempic  0.5 mg weekly. If pt unable to eat, advised to contact us . - If unable to tolerate 0.5 mg due to reduced appetite, may need to add another agent with Ozempic  0.25 - Recommend to check glucose twice daily   Follow Up Plan: 2/26  Darrelyn Drum, PharmD, BCPS Clinical Pharmacist [facility] [phone]    "

## 2024-03-06 NOTE — Patient Instructions (Addendum)
 It was a pleasure speaking with you today!  Trial Ozempic  0.5 mg weekly. I will check back in 3 weeks to see how you are tolerating it.  Feel free to call with any questions or concerns!  Darrelyn Drum, PharmD, BCPS, CPP Clinical Pharmacist Practitioner [facility] [phone]

## 2024-03-13 ENCOUNTER — Ambulatory Visit

## 2024-03-27 ENCOUNTER — Other Ambulatory Visit

## 2024-05-28 ENCOUNTER — Ambulatory Visit: Admitting: Podiatry
